# Patient Record
Sex: Female | Born: 1960 | Race: White | Hispanic: No
Health system: Northeastern US, Academic
[De-identification: ages and names within clinical notes are randomized; demographics above are authoritative.]

## PROBLEM LIST (undated history)

## (undated) DIAGNOSIS — E7800 Pure hypercholesterolemia, unspecified: Secondary | ICD-10-CM

## (undated) HISTORY — PX: KNEE SURGERY: SHX244

## (undated) HISTORY — PX: LAPAROSCOPIC GASTRIC SLEEVE RESECTION: SHX5895

## (undated) HISTORY — PX: CHOLECYSTECTOMY: SHX55

---

## 2013-05-14 DIAGNOSIS — M1990 Unspecified osteoarthritis, unspecified site: Secondary | ICD-10-CM | POA: Insufficient documentation

## 2014-11-13 DIAGNOSIS — R911 Solitary pulmonary nodule: Secondary | ICD-10-CM | POA: Insufficient documentation

## 2015-03-24 DIAGNOSIS — Z9884 Bariatric surgery status: Secondary | ICD-10-CM | POA: Insufficient documentation

## 2016-09-12 ENCOUNTER — Encounter: Payer: Self-pay | Admitting: *Deleted

## 2016-09-12 ENCOUNTER — Ambulatory Visit
Admission: EM | Admit: 2016-09-12 | Discharge: 2016-09-12 | Disposition: A | Discharge status: Home / Self Care | Payer: 59 | Attending: Family Medicine | Admitting: Family Medicine

## 2016-09-12 DIAGNOSIS — R5383 Other fatigue: Secondary | ICD-10-CM

## 2016-09-12 DIAGNOSIS — T887XXA Unspecified adverse effect of drug or medicament, initial encounter: Secondary | ICD-10-CM

## 2016-09-12 DIAGNOSIS — N390 Urinary tract infection, site not specified: Secondary | ICD-10-CM

## 2016-09-12 DIAGNOSIS — R110 Nausea: Secondary | ICD-10-CM | POA: Diagnosis not present

## 2016-09-12 DIAGNOSIS — T50905A Adverse effect of unspecified drugs, medicaments and biological substances, initial encounter: Secondary | ICD-10-CM

## 2016-09-12 LAB — URINALYSIS, COMPLETE (UACMP) WITH MICROSCOPIC
BACTERIA UA: NONE SEEN
GLUCOSE, UA: NEGATIVE mg/dL
Ketones, ur: NEGATIVE mg/dL
NITRITE: NEGATIVE
PH: 6.5 (ref 5.0–8.0)
Protein, ur: NEGATIVE mg/dL
SPECIFIC GRAVITY, URINE: 1.025 (ref 1.005–1.030)

## 2016-09-12 LAB — COMPREHENSIVE METABOLIC PANEL
ALK PHOS: 53 U/L (ref 38–126)
ALT: 9 U/L — ABNORMAL LOW (ref 14–54)
ANION GAP: 8 (ref 5–15)
AST: 15 U/L (ref 15–41)
Albumin: 4.2 g/dL (ref 3.5–5.0)
BILIRUBIN TOTAL: 0.5 mg/dL (ref 0.3–1.2)
BUN: 17 mg/dL (ref 6–20)
CALCIUM: 9 mg/dL (ref 8.9–10.3)
CO2: 26 mmol/L (ref 22–32)
Chloride: 103 mmol/L (ref 101–111)
Creatinine, Ser: 0.86 mg/dL (ref 0.44–1.00)
GLUCOSE: 116 mg/dL — AB (ref 65–99)
POTASSIUM: 3.9 mmol/L (ref 3.5–5.1)
Sodium: 137 mmol/L (ref 135–145)
TOTAL PROTEIN: 7 g/dL (ref 6.5–8.1)

## 2016-09-12 LAB — CBC WITH DIFFERENTIAL/PLATELET
BASOS ABS: 0.1 10*3/uL (ref 0–0.1)
Basophils Relative: 1 %
EOS PCT: 0 %
Eosinophils Absolute: 0 10*3/uL (ref 0–0.7)
HEMATOCRIT: 40 % (ref 35.0–47.0)
Hemoglobin: 13.8 g/dL (ref 12.0–16.0)
LYMPHS PCT: 1 %
Lymphs Abs: 0.2 10*3/uL — ABNORMAL LOW (ref 1.0–3.6)
MCH: 29.4 pg (ref 26.0–34.0)
MCHC: 34.4 g/dL (ref 32.0–36.0)
MCV: 85.6 fL (ref 80.0–100.0)
MONO ABS: 0.3 10*3/uL (ref 0.2–0.9)
MONOS PCT: 2 %
NEUTROS ABS: 13.6 10*3/uL — AB (ref 1.4–6.5)
Neutrophils Relative %: 96 %
PLATELETS: 169 10*3/uL (ref 150–440)
RBC: 4.68 MIL/uL (ref 3.80–5.20)
RDW: 12.7 % (ref 11.5–14.5)
WBC: 14.2 10*3/uL — ABNORMAL HIGH (ref 3.6–11.0)

## 2016-09-12 LAB — SEDIMENTATION RATE: Sed Rate: 30 mm/hr (ref 0–30)

## 2016-09-12 MED ORDER — CEPHALEXIN 500 MG PO CAPS: 500.0000 mg | ORAL_CAPSULE | Freq: Two times a day (BID) | ORAL | 0 refills | Status: DC

## 2016-09-12 MED ORDER — METHYLPREDNISOLONE SODIUM SUCC 125 MG IJ SOLR
125.0000 mg | Freq: Once | INTRAMUSCULAR | Status: AC
  Administered 2016-09-12: 125 mg via INTRAMUSCULAR

## 2016-09-12 NOTE — ED Provider Notes (Signed)
[facility]    CSN: [id number] Arrival date & time: 09/12/16  1200     History   Chief Complaint Chief Complaint  Patient presents with  . Generalized Body Aches  . Nausea    HPI Ariana Ertle is a 56 y.o. female.   Patient is here because of general malaise. She states that she was started on Macrobid after using a telemedicine portal at work. She describes symptoms of UTI she was placed on Macrodantin which she used before in the past. She took her dose of Macrobid 100 mg 5:00 in the evening and then about 11:30 at night. She woke this morning aching all over feeling miserable feeling is that she's had the flu. She reports myalgia muscle aches joint pain no fever but feeling miserable. She is never had these symptoms especially when she's had a flu before. She did not take a dose of Macrobid this morning. Even though his been over 12 hours since the last dose she still feels achy.   The history is provided by the patient. No language interpreter was used.  Dysuria  Pain quality:  Aching and burning Pain severity:  Moderate Duration:  4 days Timing:  Constant Progression:  Partially resolved Chronicity:  New Recent urinary tract infections: yes   Relieved by:  Antibiotics Ineffective treatments:  Antibiotics Associated symptoms: no fever   Risk factors: recurrent urinary tract infections     History reviewed. No pertinent past medical history.  There are no active problems to display for this patient.   Past Surgical History:  Procedure Laterality Date  . CESAREAN SECTION    . CHOLECYSTECTOMY    . KNEE SURGERY    . LAPAROSCOPIC GASTRIC SLEEVE RESECTION      OB History    No data available       Home Medications    Prior to Admission medications   Medication Sig Start Date End Date Taking? Authorizing Provider  nitrofurantoin, macrocrystal-monohydrate, (MACROBID) 100 MG capsule Take 100 mg by mouth 2 (two) times daily.   Yes [provider]    Family History Family History  Problem Relation Age of Onset  . Hypertension Mother     Social History Social History  Substance Use Topics  . Smoking status: Never Smoker  . Smokeless tobacco: Never Used  . Alcohol use Yes     Allergies   Patient has no known allergies.   Review of Systems Review of Systems  Constitutional: Positive for activity change, chills and fatigue. Negative for fever.  Genitourinary: Positive for dysuria.  Musculoskeletal: Positive for joint swelling and myalgias.  All other systems reviewed and are negative.    Physical Exam Triage Vital Signs ED Triage Vitals  Enc Vitals Group     BP 09/12/16 1220 107/64     Pulse Rate 09/12/16 1220 (!) 116     Resp 09/12/16 1220 18     Temp 09/12/16 1220 99.7 F (37.6 C)     Temp Source 09/12/16 1220 Oral     SpO2 09/12/16 1220 99 %     Weight 09/12/16 1221 186 lb (84.4 kg)     Height 09/12/16 1221 5\' 8"  (1.727 m)     Head Circumference --      Peak Flow --      Pain Score 09/12/16 1224 0     Pain Loc --      Pain Edu? --      Excl. in GC? --  No data found.   Updated Vital Signs BP 107/64 (BP Location: Left Arm)   Pulse (!) 116   Temp 99.7 F (37.6 C) (Oral)   Resp 18   Ht 5\' 8"  (1.727 m)   Wt 186 lb (84.4 kg)   SpO2 99%   BMI 28.28 kg/m   Visual Acuity Right Eye Distance:   Left Eye Distance:   Bilateral Distance:    Right Eye Near:   Left Eye Near:    Bilateral Near:     Physical Exam  Constitutional: She is oriented to person, place, and time. She appears well-developed and well-nourished.  Non-toxic appearance. She does not have a sickly appearance. She appears ill. No distress.  HENT:  Head: Normocephalic and atraumatic.  Right Ear: External ear normal.  Left Ear: External ear normal.  Mouth/Throat: Oropharynx is clear and moist.  Eyes: Pupils are equal, round, and reactive to light.  Neck: Normal range of motion. Neck supple.  Cardiovascular:  Normal rate, regular rhythm and normal heart sounds.   Pulmonary/Chest: Effort normal and breath sounds normal.  Abdominal: Soft. Bowel sounds are normal. She exhibits no distension. There is no tenderness.  Musculoskeletal: Normal range of motion. She exhibits no tenderness.  Neurological: She is alert and oriented to person, place, and time. No cranial nerve deficit. Coordination normal.  Skin: Skin is warm. She is not diaphoretic.  Psychiatric: She has a normal mood and affect.  Vitals reviewed.    UC Treatments / Results  Labs (all labs ordered are listed, but only abnormal results are displayed) Labs Reviewed  CBC WITH DIFFERENTIAL/PLATELET - Abnormal; Notable for the following:       Result Value   WBC 14.2 (*)    Neutro Abs 13.6 (*)    Lymphs Abs 0.2 (*)    All other components within normal limits  COMPREHENSIVE METABOLIC PANEL - Abnormal; Notable for the following:    Glucose, Bld 116 (*)    ALT 9 (*)    All other components within normal limits  URINALYSIS, COMPLETE (UACMP) WITH MICROSCOPIC - Abnormal; Notable for the following:    Color, Urine AMBER (*)    APPearance CLOUDY (*)    Hgb urine dipstick SMALL (*)    Bilirubin Urine SMALL (*)    Leukocytes, UA SMALL (*)    Squamous Epithelial / LPF 0-5 (*)    All other components within normal limits  URINE CULTURE  SEDIMENTATION RATE    EKG  EKG Interpretation None       Radiology No results found.  Procedures Procedures (including critical care time)  Medications Ordered in UC Medications - No data to display  Results for orders placed or performed during the hospital encounter of 09/12/16  CBC with Differential  Result Value Ref Range   WBC 14.2 (H) 3.6 - 11.0 K/uL   RBC 4.68 3.80 - 5.20 MIL/uL   Hemoglobin 13.8 12.0 - 16.0 g/dL   HCT 16.1 09.6 - 04.5 %   MCV 85.6 80.0 - 100.0 fL   MCH 29.4 26.0 - 34.0 pg   MCHC 34.4 32.0 - 36.0 g/dL   RDW 40.9 81.1 - 91.4 %   Platelets 169 150 - 440 K/uL    Neutrophils Relative % 96 %   Neutro Abs 13.6 (H) 1.4 - 6.5 K/uL   Lymphocytes Relative 1 %   Lymphs Abs 0.2 (L) 1.0 - 3.6 K/uL   Monocytes Relative 2 %   Monocytes Absolute 0.3 0.2 - 0.9  K/uL   Eosinophils Relative 0 %   Eosinophils Absolute 0.0 0 - 0.7 K/uL   Basophils Relative 1 %   Basophils Absolute 0.1 0 - 0.1 K/uL  Sedimentation rate  Result Value Ref Range   Sed Rate 30 0 - 30 mm/hr  Comprehensive metabolic panel  Result Value Ref Range   Sodium 137 135 - 145 mmol/L   Potassium 3.9 3.5 - 5.1 mmol/L   Chloride 103 101 - 111 mmol/L   CO2 26 22 - 32 mmol/L   Glucose, Bld 116 (H) 65 - 99 mg/dL   BUN 17 6 - 20 mg/dL   Creatinine, Ser 7.820.86 0.44 - 1.00 mg/dL   Calcium 9.0 8.9 - 95.610.3 mg/dL   Total Protein 7.0 6.5 - 8.1 g/dL   Albumin 4.2 3.5 - 5.0 g/dL   AST 15 15 - 41 U/L   ALT 9 (L) 14 - 54 U/L   Alkaline Phosphatase 53 38 - 126 U/L   Total Bilirubin 0.5 0.3 - 1.2 mg/dL   GFR calc non Af Amer >60 >60 mL/min   GFR calc Af Amer >60 >60 mL/min   Anion gap 8 5 - 15  Urinalysis, Complete w Microscopic  Result Value Ref Range   Color, Urine AMBER (A) YELLOW   APPearance CLOUDY (A) CLEAR   Specific Gravity, Urine 1.025 1.005 - 1.030   pH 6.5 5.0 - 8.0   Glucose, UA NEGATIVE NEGATIVE mg/dL   Hgb urine dipstick SMALL (A) NEGATIVE   Bilirubin Urine SMALL (A) NEGATIVE   Ketones, ur NEGATIVE NEGATIVE mg/dL   Protein, ur NEGATIVE NEGATIVE mg/dL   Nitrite NEGATIVE NEGATIVE   Leukocytes, UA SMALL (A) NEGATIVE   Squamous Epithelial / LPF 0-5 (A) NONE SEEN   WBC, UA TOO NUMEROUS TO COUNT 0 - 5 WBC/hpf   RBC / HPF 6-30 0 - 5 RBC/hpf   Bacteria, UA NONE SEEN NONE SEEN   Mucous PRESENT    Initial Impression / Assessment and Plan / UC Course  I have reviewed the triage vital signs and the nursing notes.  Pertinent labs & imaging results that were available during my care of the patient were reviewed by me and considered in my medical decision making (see chart for details).    patient sedimentation rate was high normal 30 white count was slightly elevated the kidney function appears be normal it would appear to be a drug reaction symptoms she's having so will give her dose of Solu-Medrol have been monitored and wash it home and placed on Keflex 500 mg twice a day for 7 days. PCP if not better in 2-3 weeks and work note written for today and tomorrow. If symptoms become worse when recommend she go to the ED of her choice possible blood culture further evaluation does not appear to be a pyelonephritis infection. Final Clinical Impressions(s) / UC Diagnoses   Final diagnoses:  Lower urinary tract infectious disease  Drug reaction, initial encounter    New Prescriptions New Prescriptions   No medications on file    Note: This dictation was prepared with Dragon dictation along with smaller phrase technology. Any transcriptional errors that result from this process are unintentional.   Hassan RowanWade, Niamya Vittitow, MD 09/12/16 1350

## 2016-09-12 NOTE — ED Triage Notes (Signed)
Patient was treated for UTI yesterday and started taking Macrobid yesterday evening. Patient awoke this AM with generalized body aches and nausea. Patient reports taking "microbid" in the past without issue.

## 2016-09-13 LAB — URINE CULTURE: Culture: NO GROWTH

## 2016-12-11 DIAGNOSIS — E7800 Pure hypercholesterolemia, unspecified: Secondary | ICD-10-CM | POA: Insufficient documentation

## 2016-12-11 DIAGNOSIS — E538 Deficiency of other specified B group vitamins: Secondary | ICD-10-CM | POA: Insufficient documentation

## 2017-03-14 ENCOUNTER — Emergency Department: Payer: Managed Care, Other (non HMO)

## 2017-03-14 ENCOUNTER — Encounter: Payer: Self-pay | Admitting: Intensive Care

## 2017-03-14 ENCOUNTER — Emergency Department
Admission: EM | Admit: 2017-03-14 | Discharge: 2017-03-14 | Disposition: A | Discharge status: Home / Self Care | Payer: Managed Care, Other (non HMO) | Attending: Emergency Medicine | Admitting: Emergency Medicine

## 2017-03-14 DIAGNOSIS — K5904 Chronic idiopathic constipation: Secondary | ICD-10-CM | POA: Diagnosis not present

## 2017-03-14 DIAGNOSIS — K641 Second degree hemorrhoids: Secondary | ICD-10-CM

## 2017-03-14 DIAGNOSIS — K5900 Constipation, unspecified: Secondary | ICD-10-CM | POA: Diagnosis present

## 2017-03-14 MED ORDER — HYDROCODONE-ACETAMINOPHEN 5-325 MG PO TABS
1.0000 | ORAL_TABLET | Freq: Once | ORAL | Status: AC
  Administered 2017-03-14: 1 via ORAL
  Filled 2017-03-14: qty 1

## 2017-03-14 MED ORDER — HYDROCODONE-ACETAMINOPHEN 10-325 MG PO TABS: 1.0000 | ORAL_TABLET | Freq: Once | ORAL | Status: DC

## 2017-03-14 MED ORDER — DIBUCAINE 1 % RE OINT: 1.0000 "application " | TOPICAL_OINTMENT | Freq: Three times a day (TID) | RECTAL | 0 refills | Status: AC | PRN

## 2017-03-14 MED ORDER — HYDROCORTISONE ACETATE 25 MG RE SUPP: 25.0000 mg | Freq: Two times a day (BID) | RECTAL | 1 refills | Status: AC

## 2017-03-14 MED ORDER — LIDOCAINE HCL 2 % EX GEL
1.0000 "application " | Freq: Once | CUTANEOUS | Status: AC
  Administered 2017-03-14: 1 via TOPICAL
  Filled 2017-03-14: qty 10

## 2017-03-14 MED ORDER — OXYCODONE HCL 5 MG PO TABS
5.0000 mg | ORAL_TABLET | Freq: Once | ORAL | Status: AC
  Administered 2017-03-14: 5 mg via ORAL
  Filled 2017-03-14: qty 1

## 2017-03-14 MED ORDER — POLYETHYLENE GLYCOL 3350 17 G PO PACK
17.0000 g | PACK | Freq: Every day | ORAL | Status: DC
  Administered 2017-03-14: 17 g via ORAL
  Filled 2017-03-14: qty 1

## 2017-03-14 MED ORDER — SORBITOL 70 % SOLN
960.0000 mL | TOPICAL_OIL | Freq: Once | ORAL | Status: DC
  Filled 2017-03-14: qty 473

## 2017-03-14 MED ORDER — GLYCERIN (LAXATIVE) 2.1 G RE SUPP
1.0000 | Freq: Once | RECTAL | Status: AC
  Administered 2017-03-14: 1 via RECTAL
  Filled 2017-03-14: qty 1

## 2017-03-14 MED ORDER — MAGNESIUM CITRATE PO SOLN
1.0000 | Freq: Once | ORAL | Status: AC
Start: 2017-03-14 — End: 2017-03-14
  Administered 2017-03-14: 1 via ORAL
  Filled 2017-03-14: qty 296

## 2017-03-14 MED ORDER — HYDROCORTISONE ACETATE 25 MG RE SUPP
25.0000 mg | Freq: Once | RECTAL | Status: AC
  Administered 2017-03-14: 25 mg via RECTAL
  Filled 2017-03-14: qty 1

## 2017-03-14 NOTE — Discharge Instructions (Signed)
You exam is consistent with hemorrhoids complicated by constipation. Increase your daily fiber and dose Miralax daily. Consider an OTC glycerin suppository as needed. Follow-up with your provider or gastroenterology as discussed.

## 2017-03-14 NOTE — ED Notes (Signed)
Received suppositories at this time from pharmacy for patient

## 2017-03-14 NOTE — ED Notes (Signed)
Pt reports to this nurse that she had a liquid bowel movement and does not believe she needs the enema. Provider notified.

## 2017-03-14 NOTE — ED Notes (Signed)
See triage note  Presents with rectal pain d/t hemorrhoids  States she also has been constipated   Unsure of last BM  Positive nausea no vomiting  States she has tried OTC meds w/o relief

## 2017-03-14 NOTE — ED Notes (Signed)
conts to have no results with prior meds   Provider aware

## 2017-03-14 NOTE — ED Triage Notes (Addendum)
Patient reports a flare up of hemorrhoids and constipation for over a week. Is passing gas. Ambulatory with grimace on face. Has tried OTC meds with no relief for constipation. HX weight loss surgery

## 2017-03-14 NOTE — ED Provider Notes (Signed)
[facility] Emergency Department Provider Note ____________________________________________  Time seen: 1135  I have reviewed the triage vital signs and the nursing notes.  HISTORY  Chief Complaint  Hemorrhoids and Constipation  HPI Ariana Jefferson is a 57 y.o. female sent to the ED for evaluation of rectal pain and pressure for the last week.  Patient describes history of hemorrhoids and constipation in the past.  She reports passing gas but no significant stool.  She is without any fevers, chills, nausea, vomiting, or abdominal pain.  She has tried over-the-counter MiraLAX without significant benefit.  History reviewed. No pertinent past medical history.  There are no active problems to display for this patient.   Past Surgical History:  Procedure Laterality Date  . CESAREAN SECTION    . CHOLECYSTECTOMY    . KNEE SURGERY    . LAPAROSCOPIC GASTRIC SLEEVE RESECTION      Prior to Admission medications   Medication Sig Start Date End Date Taking? Authorizing Provider  cephALEXin (KEFLEX) 500 MG capsule Take 1 capsule (500 mg total) by mouth 2 (two) times daily. 09/12/16   Ariana Jefferson, Eugene, MD  dibucaine (NUPERCAINAL) 1 % OINT Place 1 application rectally 3 (three) times daily as needed for up to 10 days for hemorrhoids. 03/14/17 03/24/17  Ariana Jefferson, Ariana IvoryJenise V Bacon, PA-C  hydrocortisone (ANUSOL-HC) 25 MG suppository Place 1 suppository (25 mg total) rectally every 12 (twelve) hours for 12 days. 03/14/17 03/26/17  Ariana Jefferson, Ariana IvoryJenise V Bacon, PA-C  nitrofurantoin, macrocrystal-monohydrate, (MACROBID) 100 MG capsule Take 100 mg by mouth 2 (two) times daily.    [provider]    Allergies Patient has no known allergies.  Family History  Problem Relation Age of Onset  . Hypertension Mother     Social History Social History   Tobacco Use  . Smoking status: Never Smoker  . Smokeless tobacco: Never Used  Substance Use Topics  . Alcohol use: Yes  . Drug use: No     Review of Systems  Constitutional: Negative for fever. Eyes: Negative for visual changes. ENT: Negative for sore throat. Cardiovascular: Negative for chest pain. Respiratory: Negative for shortness of breath. Gastrointestinal: Negative for abdominal pain, vomiting and diarrhea. Genitourinary: Negative for dysuria. Musculoskeletal: Negative for back pain. Skin: Negative for rash. Neurological: Negative for headaches, focal weakness or numbness. ____________________________________________  PHYSICAL EXAM:  VITAL SIGNS: ED Triage Vitals  Enc Vitals Group     BP 03/14/17 1649 113/73     Pulse Rate 03/14/17 1649 83     Resp 03/14/17 1649 16     Temp --      Temp src --      SpO2 03/14/17 1649 96 %     Weight 03/14/17 1002 187 lb (84.8 kg)     Height 03/14/17 1002 5\' 9"  (1.753 m)     Head Circumference --      Peak Flow --      Pain Score 03/14/17 1400 2     Pain Loc --      Pain Edu? --      Excl. in GC? --     Constitutional: Alert and oriented. Well appearing and in no distress. Head: Normocephalic and atraumatic. Cardiovascular: Normal rate, regular rhythm. Normal distal pulses. Respiratory: Normal respiratory effort. No wheezes/rales/rhonchi. Gastrointestinal: Soft and nontender. No distention.  Rectal exam reveals multiple soft, moderately sized prolapsed internal and engorged external hemorrhoids.  No incarceration or strangulation is appreciated.  DRE reveals normal rectal tone and palpable internal hemorrhoids.  Musculoskeletal: Nontender with normal range of motion in all extremities.  Neurologic:  Normal gait without ataxia. Normal speech and language. No gross focal neurologic deficits are appreciated. Skin:  Skin is warm, dry and intact. No rash noted. ____________________________________________   RADIOLOGY  ABD 1 View IMPRESSION: Large amount of stool in the ascending and transverse colon.  I, Ariana Jefferson, Ariana Ivory, personally viewed and evaluated  these images (plain radiographs) as part of my medical decision making, as well as reviewing the written report by the radiologist. ____________________________________________  PROCEDURES  Procedures Glycerin suppository Hydrocortisone suppository Magnesium citrate 1 bottle Miralax 17 g PO Norco 5-325 mg PO Oxycodone IR 5 mg PO ____________________________________________  INITIAL IMPRESSION / ASSESSMENT AND PLAN / ED COURSE  Patient presents to the ED for evaluation of rectal pain and pressure.  She is also has underlying constipation.  Her exam is consistent with moderate stool burden and internal and external hemorrhoids.  Patient reports improvement of her symptoms after a moderate amount of soft watery stool this past in the ED.  She is discharged at this time with prescriptions for Anusol suppositories and dibucaine topical ointment.  She is encouraged to increase her daily fiber intake and use MiraLAX daily.  She will follow-up with her primary care provider or gastroenterology for ongoing symptom management. ____________________________________________  FINAL CLINICAL IMPRESSION(S) / ED DIAGNOSES  Final diagnoses:  Chronic idiopathic constipation  Grade II hemorrhoids      Ariana Jefferson, Ariana Ivory, PA-C 03/14/17 1749    Ariana Antis, MD 03/14/17 1947

## 2017-09-29 ENCOUNTER — Emergency Department: Payer: Managed Care, Other (non HMO)

## 2017-09-29 ENCOUNTER — Encounter: Payer: Self-pay | Admitting: Emergency Medicine

## 2017-09-29 ENCOUNTER — Emergency Department
Admission: EM | Admit: 2017-09-29 | Discharge: 2017-09-29 | Disposition: A | Discharge status: Home / Self Care | Payer: Managed Care, Other (non HMO) | Attending: Emergency Medicine | Admitting: Emergency Medicine

## 2017-09-29 ENCOUNTER — Other Ambulatory Visit: Payer: Self-pay

## 2017-09-29 DIAGNOSIS — R1013 Epigastric pain: Secondary | ICD-10-CM | POA: Insufficient documentation

## 2017-09-29 DIAGNOSIS — R112 Nausea with vomiting, unspecified: Secondary | ICD-10-CM | POA: Diagnosis not present

## 2017-09-29 LAB — COMPREHENSIVE METABOLIC PANEL
ALBUMIN: 4.3 g/dL (ref 3.5–5.0)
ALT: 10 U/L (ref 0–44)
AST: 16 U/L (ref 15–41)
Alkaline Phosphatase: 51 U/L (ref 38–126)
Anion gap: 9 (ref 5–15)
BUN: 20 mg/dL (ref 6–20)
CHLORIDE: 106 mmol/L (ref 98–111)
CO2: 26 mmol/L (ref 22–32)
CREATININE: 0.75 mg/dL (ref 0.44–1.00)
Calcium: 9.4 mg/dL (ref 8.9–10.3)
GFR calc Af Amer: >60 mL/min (ref >60)
GLUCOSE: 107 mg/dL — AB (ref 70–99)
Potassium: 3.9 mmol/L (ref 3.5–5.1)
Sodium: 141 mmol/L (ref 135–145)
Total Bilirubin: 1 mg/dL (ref 0.3–1.2)
Total Protein: 7 g/dL (ref 6.5–8.1)

## 2017-09-29 LAB — CBC
HEMATOCRIT: 40.4 % (ref 35.0–47.0)
Hemoglobin: 14.3 g/dL (ref 12.0–16.0)
MCH: 30.5 pg (ref 26.0–34.0)
MCHC: 35.4 g/dL (ref 32.0–36.0)
MCV: 85.9 fL (ref 80.0–100.0)
Platelets: 225 10*3/uL (ref 150–440)
RBC: 4.7 MIL/uL (ref 3.80–5.20)
RDW: 13.7 % (ref 11.5–14.5)
WBC: 10.8 10*3/uL (ref 3.6–11.0)

## 2017-09-29 LAB — LIPASE, BLOOD: LIPASE: 22 U/L (ref 11–51)

## 2017-09-29 LAB — TROPONIN I
Troponin I: <0.03 ng/mL (ref <0.03)
Troponin I: <0.03 ng/mL (ref <0.03)

## 2017-09-29 MED ORDER — ONDANSETRON 4 MG PO TBDP: 4.0000 mg | ORAL_TABLET | Freq: Three times a day (TID) | ORAL | 0 refills | Status: DC | PRN

## 2017-09-29 MED ORDER — ALUM & MAG HYDROXIDE-SIMETH 400-400-40 MG/5ML PO SUSP: 5.0000 mL | Freq: Four times a day (QID) | ORAL | 0 refills | Status: DC | PRN

## 2017-09-29 MED ORDER — PANTOPRAZOLE SODIUM 20 MG PO TBEC: 20.0000 mg | DELAYED_RELEASE_TABLET | Freq: Every day | ORAL | 0 refills | Status: AC

## 2017-09-29 NOTE — ED Notes (Signed)
Pt declines offer for warm blankets.

## 2017-09-29 NOTE — ED Notes (Signed)
Pt waiting patiently for treatment room; provided blanket for comfort

## 2017-09-29 NOTE — Discharge Instructions (Addendum)

## 2017-09-29 NOTE — ED Notes (Signed)
Pt states epigastric to bilateral upper quadrant pain with nausea and vomiting today. Pt states she does not have pain currently, nausea continues. Last emesis in waiting room. Pt denies diarrhea. resps unlabored.

## 2017-09-29 NOTE — ED Provider Notes (Signed)
[facility] Emergency Department Provider Note  ____________________________________________  Time seen: Approximately 9:37 PM  I have reviewed the triage vital signs and the nursing notes.   HISTORY  Chief Complaint Abdominal Pain (Epigastric) and Emesis   HPI Ariana Jefferson is a 57 y.o. female with a history of gastric sleeve, cholecystectomy, and C-section who presents for evaluation of epigastric abdominal pain.  Patient reports that the pain started at 1 AM in the morning.  She was sleeping on a camping trip.  The pain was severe and burning, located in the epigastric region, radiating to the back, and associated with nausea.  After a few hours patient was able to fall asleep.  When she woke up this morning the pain was persistent and more severe.  Patient had several episodes of nonbloody nonbilious emesis.  She reports that this evening she started to be afraid that this could be cardiac in nature which prompted her visit to the emergency room.  No hematemesis or coffee-ground emesis, no constipation or diarrhea, no fever or chills, no chest pain or shortness of breath.  She denies history of GERD.  She denies eating anything abnormal yesterday.  At this time patient is pain-free.  No prior history of peptic ulcer disease.  PMH Low vitamin B12 DJD Basal cell cancer  Past Surgical History:  Procedure Laterality Date  . CESAREAN SECTION    . CHOLECYSTECTOMY    . KNEE SURGERY    . LAPAROSCOPIC GASTRIC SLEEVE RESECTION      Prior to Admission medications   Medication Sig Start Date End Date Taking? Authorizing Provider  alum & mag hydroxide-simeth (MAALOX MAX) 400-400-40 MG/5ML suspension Take 5 mLs by mouth every 6 (six) hours as needed for indigestion. 09/29/17   Don PerkingVeronese, [place], MD  cephALEXin (KEFLEX) 500 MG capsule Take 1 capsule (500 mg total) by mouth 2 (two) times daily. 09/12/16   Hassan RowanWade, [place], MD  nitrofurantoin, macrocrystal-monohydrate,  (MACROBID) 100 MG capsule Take 100 mg by mouth 2 (two) times daily.    [provider]  ondansetron (ZOFRAN ODT) 4 MG disintegrating tablet Take 1 tablet (4 mg total) by mouth every 8 (eight) hours as needed for nausea or vomiting. 09/29/17   Don PerkingVeronese, [place], MD  pantoprazole (PROTONIX) 20 MG tablet Take 1 tablet (20 mg total) by mouth daily for 7 days. 09/29/17 10/06/17  Nita SickleVeronese, [place], MD    Allergies Patient has no known allergies.  Family History  Problem Relation Age of Onset  . Hypertension Mother     Social History Social History   Tobacco Use  . Smoking status: Never Smoker  . Smokeless tobacco: Never Used  Substance Use Topics  . Alcohol use: Yes  . Drug use: No    Review of Systems  Constitutional: Negative for fever. Eyes: Negative for visual changes. ENT: Negative for sore throat. Neck: No neck pain  Cardiovascular: Negative for chest pain. Respiratory: Negative for shortness of breath. Gastrointestinal: + burning epigastric abdominal pain, nausea, and vomiting. No diarrhea. Genitourinary: Negative for dysuria. Musculoskeletal: Negative for back pain. Skin: Negative for rash. Neurological: Negative for headaches, weakness or numbness. Psych: No SI or HI  ____________________________________________   PHYSICAL EXAM:  VITAL SIGNS: ED Triage Vitals  Enc Vitals Group     BP 09/29/17 1639 134/84     Pulse Rate 09/29/17 1639 87     Resp 09/29/17 1639 18     Temp 09/29/17 1639 98 F (36.7 C)     Temp Source  09/29/17 1639 Oral     SpO2 09/29/17 1639 99 %     Weight 09/29/17 1640 192 lb (87.1 kg)     Height 09/29/17 1640 5' 8.5" (1.74 m)     Head Circumference --      Peak Flow --      Pain Score 09/29/17 1640 7     Pain Loc --      Pain Edu? --      Excl. in GC? --     Constitutional: Alert and oriented. Well appearing and in no apparent distress. HEENT:      Head: Normocephalic and atraumatic.         Eyes: Conjunctivae are normal.  Sclera is non-icteric.       Mouth/Throat: Mucous membranes are moist.       Neck: Supple with no signs of meningismus. Cardiovascular: Regular rate and rhythm. No murmurs, gallops, or rubs. 2+ symmetrical distal pulses are present in all extremities. No JVD. Respiratory: Normal respiratory effort. Lungs are clear to auscultation bilaterally. No wheezes, crackles, or rhonchi.  Gastrointestinal: Soft, mild epigastric tenderness, and non distended with positive bowel sounds. No rebound or guarding. Genitourinary: No CVA tenderness. Musculoskeletal: Nontender with normal range of motion in all extremities. No edema, cyanosis, or erythema of extremities. Neurologic: Normal speech and language. Face is symmetric. Moving all extremities. No gross focal neurologic deficits are appreciated. Skin: Skin is warm, dry and intact. No rash noted. Psychiatric: Mood and affect are normal. Speech and behavior are normal.  ____________________________________________   LABS (all labs ordered are listed, but only abnormal results are displayed)  Labs Reviewed  COMPREHENSIVE METABOLIC PANEL - Abnormal; Notable for the following components:      Result Value   Glucose, Bld 107 (*)    All other components within normal limits  LIPASE, BLOOD  CBC  TROPONIN I  TROPONIN I  URINALYSIS, COMPLETE (UACMP) WITH MICROSCOPIC   ____________________________________________  EKG  ED ECG REPORT I, Nita Sickle, the attending physician, personally viewed and interpreted this ECG.  Normal sinus rhythm, rate of 91, normal intervals, normal axis, T wave inversion in 1, aVL, V2, no ST elevations.  No prior for comparison. ____________________________________________  RADIOLOGY  I have personally reviewed the images performed during this visit and I agree with the Radiologist's read.   Interpretation by Radiologist:  Dg Abdomen Acute W/chest  Result Date: 09/29/2017 CLINICAL DATA:  Epigastric abdominal pain  starting this morning with vomiting. EXAM: DG ABDOMEN ACUTE W/ 1V CHEST COMPARISON:  None. FINDINGS: Normal cardiac and mediastinal contours without alveolar consolidation, CHF, effusion or pneumothorax. Moderate stool retention within the colon. Scattered air containing small bowel loops without obstructive pattern. Findings may represent a mild enteritis. Gas and stool is noted in the rectum. No acute osseous abnormality. Lower lumbar facet arthropathy at L5-S1. Cholecystectomy clips are seen in the right upper quadrant. No radiopaque calculi. IMPRESSION: 1. No acute cardiopulmonary disease. 2. A few scattered gas containing, mildly distended small bowel loops are noted in a nonobstructive bowel gas pattern. Findings may represent a small bowel enteritis. Increased colonic stool burden is also noted. Electronically Signed   By: Tollie Eth M.D.   On: 09/29/2017 22:28      ____________________________________________   PROCEDURES  Procedure(s) performed: None Procedures Critical Care performed:  None ____________________________________________   INITIAL IMPRESSION / ASSESSMENT AND PLAN / ED COURSE  57 y.o. female with a history of gastric sleeve, cholecystectomy, and C-section who presents for evaluation of  epigastric abdominal pain.  Patient is now pain-free, vitals are within normal limits, she is well-appearing, abdomen is soft with mild tenderness to palpation in the epigastric region, no rebound or guarding.  EKG showing T wave inversions in 1, aVL, and V2.  No prior for comparison.  Labs including CBC, CMP, lipase, troponin are all within normal limits.  We will get a second troponin since initial labs were done 5 hours ago to rule out ACS.  Ddx PUD, gastritis, GERD, ACS.     _________________________ 11:02 PM on 09/29/2017 -----------------------------------------  Second troponin is also negative.  Patient remains pain-free.  KUB was done concerning for possible enteritis. at this  time most likely diagnosis would be GERD versus peptic ulcer disease versus enteritis.  Will discharge patient with Maalox, Zofran, and Protonix.  Recommend follow-up with her primary care doctor.  Discussed return precautions for recurrence of the pain, coffee-ground emesis, new or worsening abdominal pain or fever   As part of my medical decision making, I reviewed the following data within the electronic MEDICAL RECORD NUMBER Nursing notes reviewed and incorporated, Labs reviewed , EKG interpreted , Old chart reviewed, Radiograph reviewed , Notes from prior ED visits and [place] Controlled Substance Database    Pertinent labs & imaging results that were available during my care of the patient were reviewed by me and considered in my medical decision making (see chart for details).    ____________________________________________   FINAL CLINICAL IMPRESSION(S) / ED DIAGNOSES  Final diagnoses:  Epigastric burning sensation      NEW MEDICATIONS STARTED DURING THIS VISIT:  ED Discharge Orders         Ordered    alum & mag hydroxide-simeth (MAALOX MAX) 400-400-40 MG/5ML suspension  Every 6 hours PRN     09/29/17 2259    pantoprazole (PROTONIX) 20 MG tablet  Daily     09/29/17 2259    ondansetron (ZOFRAN ODT) 4 MG disintegrating tablet  Every 8 hours PRN     09/29/17 2259           Note:  This document was prepared using Dragon voice recognition software and may include unintentional dictation errors.    Nita Sickle, MD 09/29/17 [phone]

## 2017-09-29 NOTE — ED Triage Notes (Addendum)
Pt arrived via POV with family with reports of epigastric abdominal pain that started this morning. Pt states the pain has worsened throughout the day and has vomited multiple times.  Pt has hx of gastric sleeve in 2017, lap chole, c-section, and BTL.   Pt describes the pain as burning rating 7/10 on the pain scale. Pt states at intermittent times the pain worsens and is an 8-9/10.  Pt states the pain radiates to her back

## 2017-09-29 NOTE — ED Notes (Signed)
Report received on pt and care assumed.

## 2017-09-29 NOTE — ED Notes (Signed)
Pt unable to urinate while in triage.

## 2018-02-07 ENCOUNTER — Ambulatory Visit: Payer: Managed Care, Other (non HMO) | Admitting: Podiatry

## 2018-02-07 ENCOUNTER — Ambulatory Visit (INDEPENDENT_AMBULATORY_CARE_PROVIDER_SITE_OTHER): Payer: Managed Care, Other (non HMO)

## 2018-02-07 ENCOUNTER — Encounter: Payer: Self-pay | Admitting: Podiatry

## 2018-02-07 VITALS — Temp 97.7°F

## 2018-02-07 DIAGNOSIS — L84 Corns and callosities: Secondary | ICD-10-CM

## 2018-02-07 DIAGNOSIS — Z85828 Personal history of other malignant neoplasm of skin: Secondary | ICD-10-CM | POA: Insufficient documentation

## 2018-02-07 DIAGNOSIS — M205X2 Other deformities of toe(s) (acquired), left foot: Secondary | ICD-10-CM

## 2018-02-07 DIAGNOSIS — E669 Obesity, unspecified: Secondary | ICD-10-CM | POA: Insufficient documentation

## 2018-02-07 DIAGNOSIS — M2042 Other hammer toe(s) (acquired), left foot: Secondary | ICD-10-CM | POA: Diagnosis not present

## 2018-02-07 NOTE — Progress Notes (Signed)
Subjective:    Patient ID: Vara GuardianDonna Thornhill, female    DOB: 04/25/1960, 57 y.o.   MRN: [id number]  HPI  57 year old female presents to the office today for concerns of a painful corn to the inside aspect of her fifth toe which is been on for the last 2 weeks and hurts with shoes.  She also states it hurts when she was barefoot.  For last 2 days she was using a medicated corn pad which is helped the top layer to come off.  She did notice the central aspect has been red.  Denies any drainage or pus coming from the area.  She states is feeling much better injection was canceled today's appointment.  She was at the area checked however.  Review of Systems  All other systems reviewed and are negative.  History reviewed. No pertinent past medical history.  Past Surgical History:  Procedure Laterality Date  . CESAREAN SECTION    . CHOLECYSTECTOMY    . KNEE SURGERY    . LAPAROSCOPIC GASTRIC SLEEVE RESECTION       Current Outpatient Medications:  .  alum & mag hydroxide-simeth (MAALOX MAX) 400-400-40 MG/5ML suspension, Take 5 mLs by mouth every 6 (six) hours as needed for indigestion., Disp: 355 mL, Rfl: 0 .  cephALEXin (KEFLEX) 500 MG capsule, Take 1 capsule (500 mg total) by mouth 2 (two) times daily., Disp: 10 capsule, Rfl: 0 .  nitrofurantoin, macrocrystal-monohydrate, (MACROBID) 100 MG capsule, Take 100 mg by mouth 2 (two) times daily., Disp: , Rfl:  .  ondansetron (ZOFRAN ODT) 4 MG disintegrating tablet, Take 1 tablet (4 mg total) by mouth every 8 (eight) hours as needed for nausea or vomiting., Disp: 20 tablet, Rfl: 0 .  pantoprazole (PROTONIX) 20 MG tablet, Take 1 tablet (20 mg total) by mouth daily for 7 days., Disp: 7 tablet, Rfl: 0  No Known Allergies       Objective:   Physical Exam  General: AAO x3, NAD  Dermatological: Along the medial aspect of the left fifth toe there is evidence of a prior corn that is been removed.  There is some residual hyperkeratotic tissue to the  area but otherwise it got a macerated base.  Actually the central aspect appears to have a small wound present from where the deepest part had been removed.  There is no drainage or pus and there is no edema, erythema to the toe and there is no fluctuation crepitation or any malodor.  Vascular: Dorsalis Pedis artery and Posterior Tibial artery pedal pulses are 2/4 bilateral with immedate capillary fill time. There is no pain with calf compression, swelling, warmth, erythema.   Neruologic: Grossly intact via light touch bilateral.  Protective threshold with Semmes Wienstein monofilament intact to all pedal sites bilateral.   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  Muscular strength 5/5 in all groups tested bilateral.  Gait: Unassisted, Nonantalgic.     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  Adductovarus is present.  No evidence of acute fracture, osteomyelitis.  No soft tissue emphysema. -I did debride some of the residual hyperkeratotic tissue without any complications or bleeding.  A Band-Aid was applied to the area.  There is actually some very small superficial area of skin breakdown the central aspect.  Recommend holding off on the core remover pad.  Monitor  for any signs or symptoms of infection.  Dispensed offloading pads to help with reoccurrence.  Vivi BarrackMatthew R Wagoner DPM

## 2018-02-08 ENCOUNTER — Other Ambulatory Visit: Payer: Self-pay | Admitting: Podiatry

## 2018-02-08 DIAGNOSIS — M2042 Other hammer toe(s) (acquired), left foot: Secondary | ICD-10-CM

## 2019-02-24 MED ORDER — DEXMETHYLPHENIDATE ER 20 MG CAPSULE,EXTENDED RELEASE BIPHASIC50-50
20 | ORAL_CAPSULE | ORAL | 1 refills | 30.00000 days | Status: AC
Start: 2019-02-24 — End: 2019-03-24

## 2019-03-24 MED ORDER — DEXMETHYLPHENIDATE ER 20 MG CAPSULE,EXTENDED RELEASE BIPHASIC50-50
20 | ORAL_CAPSULE | ORAL | 1 refills | 30.00000 days | Status: AC
Start: 2019-03-24 — End: 2019-04-22

## 2019-04-21 MED ORDER — DEXMETHYLPHENIDATE ER 20 MG CAPSULE,EXTENDED RELEASE BIPHASIC50-50
20 | ORAL_CAPSULE | ORAL | 1 refills | 30.00000 days | Status: AC
Start: 2019-04-21 — End: 2019-05-21

## 2019-04-22 MED ORDER — OMEPRAZOLE 40 MG CAPSULE,DELAYED RELEASE
40 | ORAL_CAPSULE | ORAL | 12 refills | 90.00000 days | Status: AC
Start: 2019-04-22 — End: 2020-03-30

## 2019-05-09 ENCOUNTER — Encounter: Admit: 2019-05-09 | Payer: PRIVATE HEALTH INSURANCE | Attending: Obstetrics and Gynecology | Primary: Internal Medicine

## 2019-05-21 MED ORDER — DEXMETHYLPHENIDATE ER 20 MG CAPSULE,EXTENDED RELEASE BIPHASIC50-50
20 | ORAL_CAPSULE | ORAL | 1 refills | 30.00000 days | Status: AC
Start: 2019-05-21 — End: 2019-06-16

## 2019-06-16 MED ORDER — DEXMETHYLPHENIDATE ER 20 MG CAPSULE,EXTENDED RELEASE BIPHASIC50-50
20 | ORAL_CAPSULE | ORAL | 1 refills | 30.00000 days | Status: AC
Start: 2019-06-16 — End: 2019-07-14

## 2019-06-23 ENCOUNTER — Encounter: Admit: 2019-06-23 | Payer: PRIVATE HEALTH INSURANCE | Attending: Obstetrics and Gynecology | Primary: Internal Medicine

## 2019-06-23 ENCOUNTER — Ambulatory Visit: Admit: 2019-06-23 | Payer: BLUE CROSS/BLUE SHIELD | Attending: Obstetrics and Gynecology | Primary: Internal Medicine

## 2019-06-23 DIAGNOSIS — R923 Dense breast tissue: Secondary | ICD-10-CM

## 2019-06-23 DIAGNOSIS — I499 Cardiac arrhythmia, unspecified: Secondary | ICD-10-CM

## 2019-06-23 DIAGNOSIS — R002 Palpitations: Secondary | ICD-10-CM

## 2019-06-23 DIAGNOSIS — F341 Dysthymic disorder: Secondary | ICD-10-CM

## 2019-06-23 DIAGNOSIS — F909 Attention-deficit hyperactivity disorder, unspecified type: Secondary | ICD-10-CM

## 2019-06-23 DIAGNOSIS — Z01419 Encounter for gynecological examination (general) (routine) without abnormal findings: Secondary | ICD-10-CM

## 2019-06-23 DIAGNOSIS — I10 Essential (primary) hypertension: Secondary | ICD-10-CM

## 2019-06-23 DIAGNOSIS — IMO0001 Reflux: Secondary | ICD-10-CM

## 2019-06-23 DIAGNOSIS — Z1231 Encounter for screening mammogram for malignant neoplasm of breast: Secondary | ICD-10-CM

## 2019-06-23 DIAGNOSIS — J189 Pneumonia, unspecified organism: Secondary | ICD-10-CM

## 2019-06-23 DIAGNOSIS — K219 Gastro-esophageal reflux disease without esophagitis: Secondary | ICD-10-CM

## 2019-06-23 DIAGNOSIS — R55 Syncope and collapse: Secondary | ICD-10-CM

## 2019-06-26 NOTE — Other
Normal pap, neg HPV/

## 2019-07-14 MED ORDER — DEXMETHYLPHENIDATE ER 20 MG CAPSULE,EXTENDED RELEASE BIPHASIC50-50
20 | ORAL_CAPSULE | ORAL | 1 refills | 30.00000 days | Status: AC
Start: 2019-07-14 — End: 2019-08-11

## 2019-07-16 ENCOUNTER — Encounter: Payer: Self-pay | Admitting: Emergency Medicine

## 2019-07-16 ENCOUNTER — Ambulatory Visit
Admission: EM | Admit: 2019-07-16 | Discharge: 2019-07-16 | Disposition: A | Discharge status: Home / Self Care | Payer: Managed Care, Other (non HMO) | Attending: Family Medicine | Admitting: Family Medicine

## 2019-07-16 ENCOUNTER — Other Ambulatory Visit: Payer: Self-pay

## 2019-07-16 ENCOUNTER — Ambulatory Visit (INDEPENDENT_AMBULATORY_CARE_PROVIDER_SITE_OTHER): Payer: Managed Care, Other (non HMO)

## 2019-07-16 DIAGNOSIS — S060X0A Concussion without loss of consciousness, initial encounter: Secondary | ICD-10-CM | POA: Diagnosis not present

## 2019-07-16 MED ORDER — NAPROXEN 500 MG PO TABS: 500.0000 mg | ORAL_TABLET | Freq: Two times a day (BID) | ORAL | 0 refills | Status: AC | PRN

## 2019-07-16 NOTE — Discharge Instructions (Addendum)
Rest - cognitive and physical.  Decrease screen time.  If vision changes persist, see [facility].  Take care  Dr. Adriana Simas

## 2019-07-16 NOTE — ED Triage Notes (Signed)
Patient c/o falling on Saturday. She states she fell backwards and hit her right side, right elbow and she hit the back of her head. She states she immediately had a headache. She denies LOC. Denies vomiting. She is c/o blurred vision that started on Monday. She states this is worse when she is outside or on the computer. She continues to have intermittent headache.

## 2019-07-16 NOTE — ED Provider Notes (Signed)
[facility]    CSN: [id number] Arrival date & time: 07/16/19  1315  History   Chief Complaint Chief Complaint  Patient presents with  . Blurred Vision  . Fall   HPI  59 year old female presents with the above complaints.  Patient suffered a fall on Saturday.  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  She was pulling on a rope and providing tension to control the tree.  She states that there was a knot in the rope and it subsequently came loose and she fell backwards.  She states that she fell and hit the back of her head on gravel.  She states that she immediately had a frontal headache.  She denies loss of conscious.  No nausea vomiting.  Patient reports that on Sunday she was sore and had neck pain.  She states that on Monday she felt like she had issues with her vision.  She states that her vision seems blurry and foggy tickly when she is looking at computer screens or when she is outside.  She reports intermittent headache.  No other associated symptoms.  No complaints.  Patient Active Problem List   Diagnosis Date Noted  . Hx of skin cancer, basal cell 02/07/2018  . Obesity 02/07/2018  . High cholesterol 12/11/2016  . Low vitamin B12 level 12/11/2016  . 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 Lung nodule seen on imaging study 11/13/2014  . DJD (degenerative joint disease) 05/14/2013    Past Surgical History:  Procedure Laterality Date  . CESAREAN SECTION    . CHOLECYSTECTOMY    . KNEE SURGERY    . LAPAROSCOPIC GASTRIC SLEEVE RESECTION      OB History   No obstetric history on file.      Home Medications    Prior to Admission medications   Medication Sig Start Date End Date Taking? Authorizing Provider  naproxen (NAPROSYN) 500 MG tablet Take 1 tablet (500 mg total) by mouth 2 (two) times daily as needed for moderate pain or headache. 07/16/19   Tommie Sams, DO  pantoprazole (PROTONIX) 20 MG tablet Take 1 tablet (20 mg total) by  mouth daily for 7 days. 09/29/17 10/06/17  Nita Sickle, MD    Family History Family History  Problem Relation Age of Onset  . Hypertension Mother     Social History Social History   Tobacco Use  . Smoking status: Never Smoker  . Smokeless tobacco: Never Used  Substance Use Topics  . Alcohol use: Yes  . Drug use: No     Allergies   Nitrofurantoin   Review of Systems Review of Systems  Constitutional: Negative.   HENT:       Fall, head injury.   Eyes: Positive for visual disturbance.  Neurological: Positive for headaches.   Physical Exam Triage Vital Signs ED Triage Vitals  Enc Vitals Group     BP 07/16/19 1347 121/80     Pulse Rate 07/16/19 1347 74     Resp 07/16/19 1347 18     Temp 07/16/19 1347 98.2 F (36.8 C)     Temp Source 07/16/19 1347 Oral     SpO2 07/16/19 1347 100 %     Weight 07/16/19 1349 205 lb (93 kg)     Height 07/16/19 1349 5\' 9"  (1.753 m)     Head Circumference --      Peak Flow --      Pain Score 07/16/19 1349 1  Pain Loc --      Pain Edu? --      Excl. in GC? --    Updated Vital Signs BP 121/80 (BP Location: Right Arm)   Pulse 74   Temp 98.2 F (36.8 C) (Oral)   Resp 18   Ht 5\' 9"  (1.753 m)   Wt 93 kg   SpO2 100%   BMI 30.27 kg/m   Visual Acuity Right Eye Distance:   Left Eye Distance:   Bilateral Distance:    Right Eye Near:   Left Eye Near:    Bilateral Near:     Physical Exam Vitals and nursing note reviewed.  Constitutional:      General: She is not in acute distress.    Appearance: Normal appearance. She is not ill-appearing.  HENT:     Head: Normocephalic and atraumatic.     Right Ear: Tympanic membrane normal.     Left Ear: Tympanic membrane normal.     Mouth/Throat:     Pharynx: Oropharynx is clear. No oropharyngeal exudate or posterior oropharyngeal erythema.  Eyes:     General:        Right eye: No discharge.        Left eye: No discharge.     Conjunctiva/sclera: Conjunctivae normal.    Cardiovascular:     Rate and Rhythm: Normal rate and regular rhythm.     Heart sounds: No murmur.  Pulmonary:     Effort: Pulmonary effort is normal.     Breath sounds: No wheezing, rhonchi or rales.  Skin:    General: Skin is warm.     Findings: No rash.  Neurological:     General: No focal deficit present.     Mental Status: She is alert and oriented to person, place, and time.     Cranial Nerves: No cranial nerve deficit.     Motor: No weakness.  Psychiatric:        Mood and Affect: Mood normal.        Behavior: Behavior normal.    UC Treatments / Results  Labs (all labs ordered are listed, but only abnormal results are displayed) Labs Reviewed - No data to display  EKG   Radiology DG Cervical Spine Complete  Result Date: 07/16/2019 CLINICAL DATA:  Pain following recent fall EXAM: CERVICAL SPINE - COMPLETE 4+ VIEW COMPARISON:  None. FINDINGS: Frontal, lateral, open-mouth odontoid, and bilateral oblique views were obtained. There is no fracture or spondylolisthesis. Prevertebral soft tissues and predental space regions are normal. There is moderate disc space narrowing at C5-6 and C6-7. There are anterior osteophytes at C5 and C6. There is calcification in the anterior ligament at C5-6 and C6-7. There is facet hypertrophy with mild exit foraminal narrowing at C4-5, C5-6, and C6-7 bilaterally. Lung apices are clear. 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 No fracture or spondylolisthesis. Electronically Signed   By: 07/18/2019 III M.D.   On: 07/16/2019 14:45    Procedures Procedures (including critical care time)  Medications Ordered in UC Medications - No data to display  Initial Impression / Assessment and Plan / UC Course  I have reviewed the triage vital signs and the nursing notes.  Pertinent labs & imaging results that were available during my care of the patient were reviewed by me and considered in my medical  decision making (see chart for details).    59 year old female presents for evaluation after suffering a fall.  X-ray of  the neck was obtained and was independently reviewed by me.  Interpretation: Degenerative changes noted.  No acute fracture.  Clinically, patient appears to be suffering from concussion.  Advised cognitive and physical rest.  Naproxen as directed for pain.  Supportive care.  Work note given.   Final Clinical Impressions(s) / UC Diagnoses   Final diagnoses:  Concussion without loss of consciousness, initial encounter     Discharge Instructions     Rest - cognitive and physical.  Decrease screen time.  If vision changes persist, see [facility].  Take care  Dr. Lacinda Axon    ED Prescriptions    Medication Sig Dispense Auth. Provider   naproxen (NAPROSYN) 500 MG tablet Take 1 tablet (500 mg total) by mouth 2 (two) times daily as needed for moderate pain or headache. 30 tablet Coral Spikes, DO     PDMP not reviewed this encounter.   Coral Spikes, [place] 07/16/19 1558

## 2019-08-11 MED ORDER — DEXMETHYLPHENIDATE ER 20 MG CAPSULE,EXTENDED RELEASE BIPHASIC50-50
20 | ORAL_CAPSULE | ORAL | 1 refills | 30.00000 days | Status: AC
Start: 2019-08-11 — End: 2019-09-08

## 2019-08-11 MED ORDER — LOSARTAN 100 MG-HYDROCHLOROTHIAZIDE 12.5 MG TABLET
100-12.5 | ORAL_TABLET | ORAL | 3 refills | 90.00000 days | Status: AC
Start: 2019-08-11 — End: 2020-04-26

## 2019-08-18 MED ORDER — DULOXETINE 60 MG CAPSULE,DELAYED RELEASE
60 | ORAL_CAPSULE | ORAL | 3 refills | 45.00000 days | Status: AC
Start: 2019-08-18 — End: 2019-08-26

## 2019-08-26 MED ORDER — DULOXETINE 60 MG CAPSULE,DELAYED RELEASE
60 | ORAL_CAPSULE | ORAL | 3 refills | 45.00000 days | Status: AC
Start: 2019-08-26 — End: 2020-05-25

## 2019-09-08 MED ORDER — DEXMETHYLPHENIDATE ER 20 MG CAPSULE,EXTENDED RELEASE BIPHASIC50-50
20 | ORAL_CAPSULE | ORAL | 1 refills | 30.00000 days | Status: AC
Start: 2019-09-08 — End: 2019-12-01

## 2019-10-21 IMAGING — DX DG ABDOMEN 1V
1 series · 1 of 1 positions shown · non-contrast
Comparison: None.

CLINICAL DATA: Constipation

EXAM:
ABDOMEN - 1 VIEW

[abdomen kub]
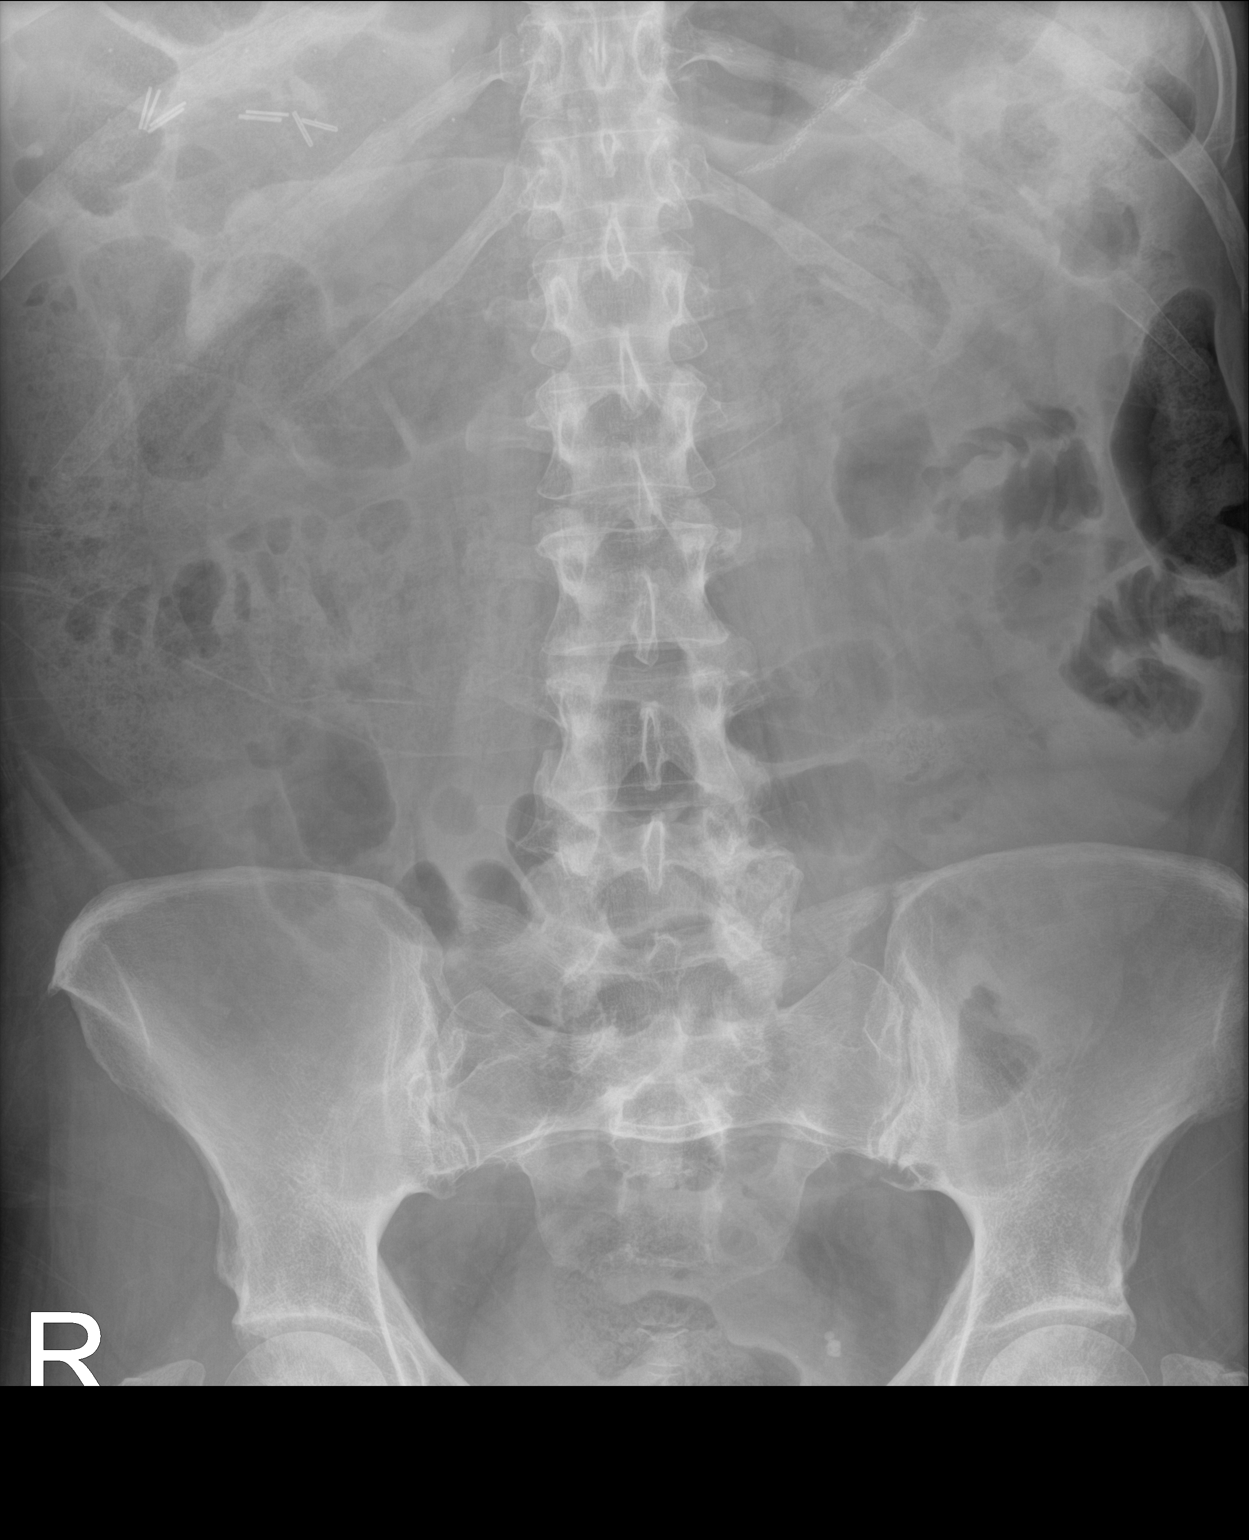

[1 of 1 positions shown; findings below may reference images not displayed]

FINDINGS: Large amount of stool in the ascending and transverse colon. There
is no bowel dilatation to suggest obstruction. There is no evidence
of pneumoperitoneum, portal venous gas or pneumatosis.

There are no pathologic calcifications along the expected course of
the ureters.

There is no acute osseous abnormality.
IMPRESSION: Large amount of stool in the ascending and transverse colon.

## 2019-11-10 MED ORDER — METOPROLOL SUCCINATE ER 25 MG TABLET,EXTENDED RELEASE 24 HR
25 | ORAL_TABLET | ORAL | 3 refills | 90.00000 days | Status: AC
Start: 2019-11-10 — End: 2020-07-16

## 2019-12-01 MED ORDER — DEXMETHYLPHENIDATE ER 20 MG CAPSULE,EXTENDED RELEASE BIPHASIC50-50
20 | ORAL_CAPSULE | ORAL | 1 refills | 30.00000 days | Status: AC
Start: 2019-12-01 — End: 2020-01-27

## 2020-01-08 ENCOUNTER — Encounter: Admit: 2020-01-08 | Payer: PRIVATE HEALTH INSURANCE | Attending: Obstetrics and Gynecology | Primary: Internal Medicine

## 2020-01-10 ENCOUNTER — Ambulatory Visit: Admit: 2020-01-10 | Payer: BLUE CROSS/BLUE SHIELD | Primary: Internal Medicine

## 2020-01-10 DIAGNOSIS — Z20828 Contact with and (suspected) exposure to other viral communicable diseases: Secondary | ICD-10-CM

## 2020-01-10 DIAGNOSIS — Z20822 Contact with and (suspected) exposure to covid-19: Secondary | ICD-10-CM

## 2020-01-12 LAB — COVID-19 CLEARANCE OR FOR PLACEMENT ONLY: BKR SARS-COV-2 RNA (COVID-19) (YH): NOT DETECTED

## 2020-01-16 MED ORDER — BUPROPION HCL XL 150 MG 24 HR TABLET, EXTENDED RELEASE
150 | ORAL_TABLET | Freq: Every morning | ORAL | 6 refills | 90.00000 days | Status: AC
Start: 2020-01-16 — End: 2020-05-24

## 2020-01-26 ENCOUNTER — Encounter: Admit: 2020-01-26 | Payer: PRIVATE HEALTH INSURANCE | Attending: Obstetrics and Gynecology | Primary: Internal Medicine

## 2020-01-27 MED ORDER — DEXMETHYLPHENIDATE ER 20 MG CAPSULE,EXTENDED RELEASE BIPHASIC50-50
20 | ORAL_CAPSULE | ORAL | 1 refills | 30.00000 days | Status: AC
Start: 2020-01-27 — End: 2020-03-01

## 2020-03-01 MED ORDER — DEXMETHYLPHENIDATE ER 20 MG CAPSULE,EXTENDED RELEASE BIPHASIC50-50
20 | ORAL_CAPSULE | ORAL | 1 refills | 30.00000 days | Status: AC
Start: 2020-03-01 — End: 2020-04-28

## 2020-03-30 MED ORDER — OMEPRAZOLE 40 MG CAPSULE,DELAYED RELEASE
40 | ORAL_CAPSULE | ORAL | 12 refills | 90.00000 days | Status: AC
Start: 2020-03-30 — End: 2021-04-08

## 2020-04-26 MED ORDER — LOSARTAN 100 MG-HYDROCHLOROTHIAZIDE 12.5 MG TABLET
100-12.5 | ORAL_TABLET | ORAL | 3 refills | 90.00000 days | Status: AC
Start: 2020-04-26 — End: 2021-04-08

## 2020-04-27 MED ORDER — DEXMETHYLPHENIDATE ER 20 MG CAPSULE,EXTENDED RELEASE BIPHASIC50-50
20 | ORAL_CAPSULE | ORAL | 1 refills | 30.00000 days | Status: AC
Start: 2020-04-27 — End: 2020-05-25

## 2020-05-07 IMAGING — CR DG ABDOMEN ACUTE W/ 1V CHEST
1 series · 4 of 4 positions shown · non-contrast
Comparison: None.

CLINICAL DATA: Epigastric abdominal pain starting this morning with
vomiting.

EXAM:
DG ABDOMEN ACUTE W/ 1V CHEST

[Series 1: dg abd acute w/chest · 0.14mm/px · 4 of 4 slices shown]
[im 1/4]
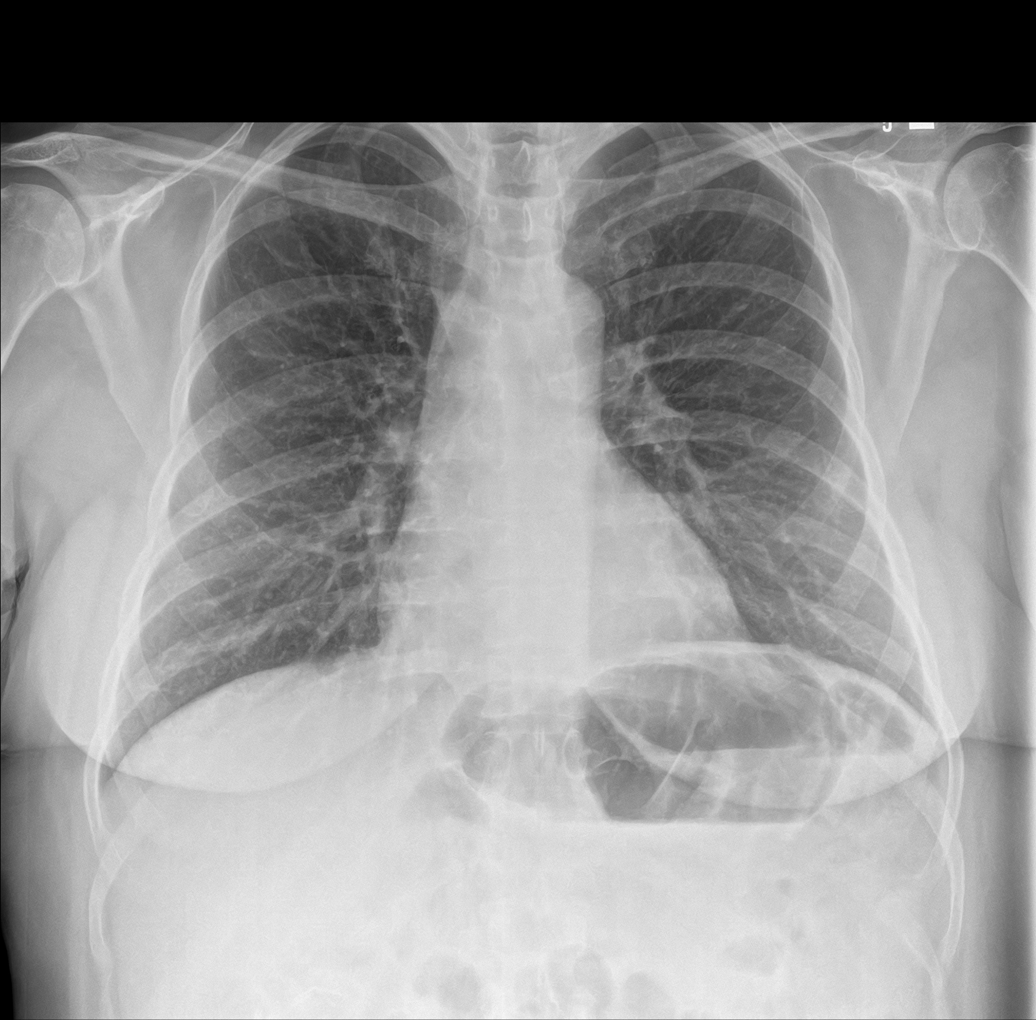
[im 2/4]
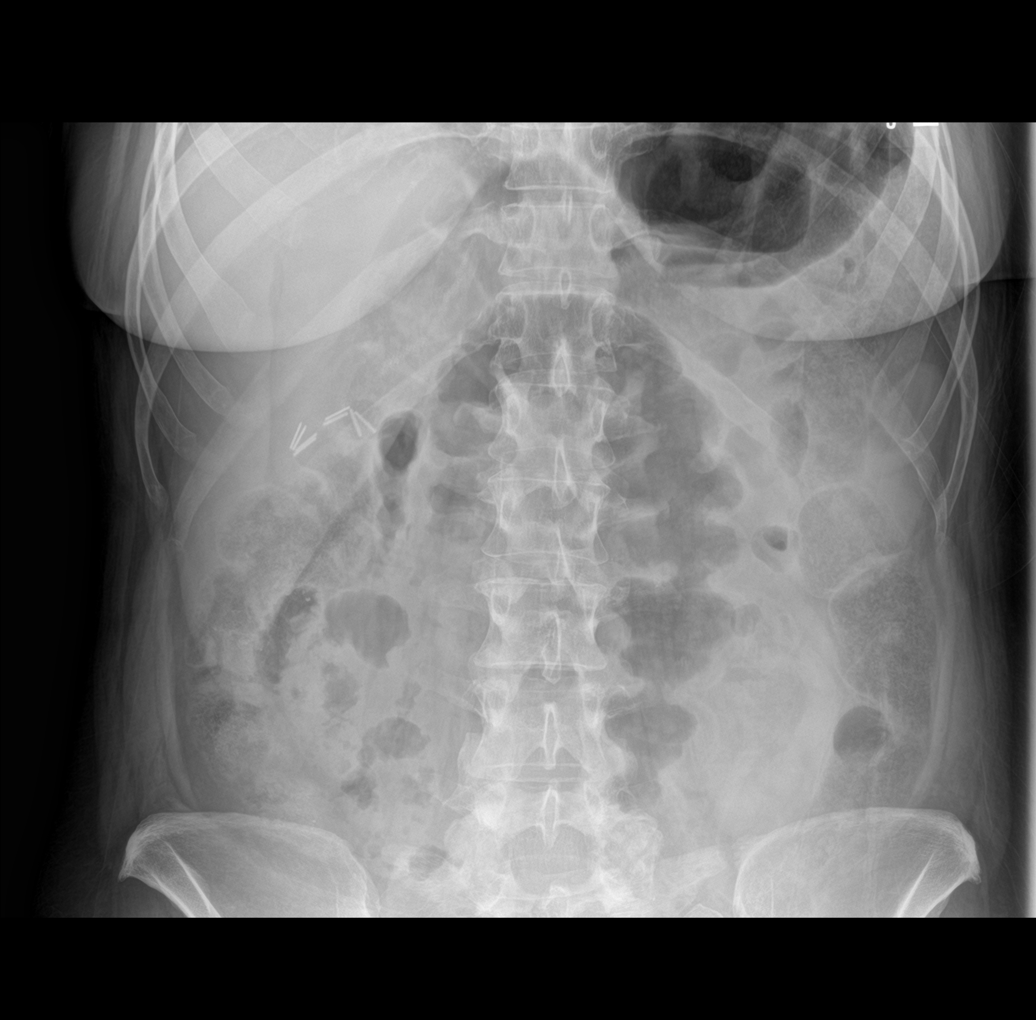
[im 3/4]
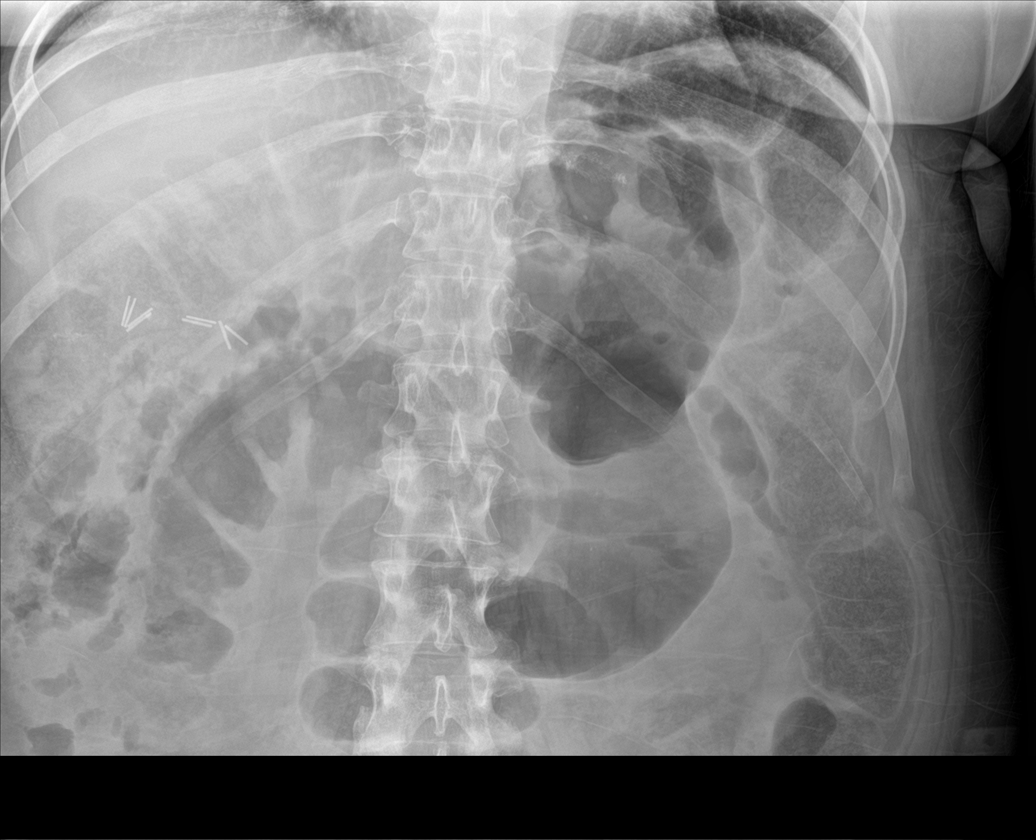
[im 4/4]
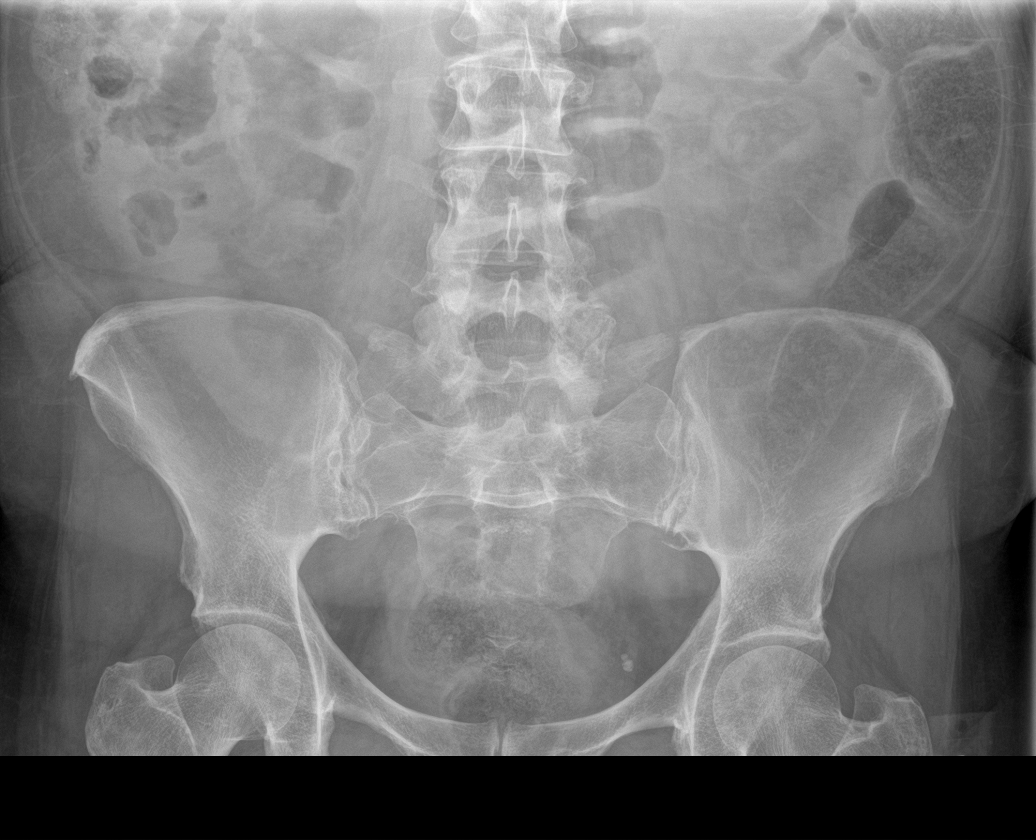

[4 of 4 positions shown; findings below may reference images not displayed]

FINDINGS: Normal cardiac and mediastinal contours without alveolar
consolidation, CHF, effusion or pneumothorax. Moderate stool
retention within the colon. Scattered air containing small bowel
loops without obstructive pattern. Findings may represent a mild
enteritis. Gas and stool is noted in the rectum. No acute osseous
abnormality. Lower lumbar facet arthropathy at L5-S1.
Cholecystectomy clips are seen in the right upper quadrant. No
radiopaque calculi.
IMPRESSION: 1. No acute cardiopulmonary disease.
2. A few scattered gas containing, mildly distended small bowel
loops are noted in a nonobstructive bowel gas pattern. Findings may
represent a small bowel enteritis. Increased colonic stool burden is
also noted.

## 2020-05-24 MED ORDER — CHOLECALCIFEROL (VITAMIN D3) 1,250 MCG (50,000 UNIT) CAPSULE
1250 | ORAL_CAPSULE | ORAL | 3 refills | 84.00000 days | Status: AC
Start: 2020-05-24 — End: 2020-11-05

## 2020-05-24 MED ORDER — DEXMETHYLPHENIDATE 10 MG TABLET
10 | ORAL_TABLET | ORAL | 1 refills | 30.00000 days | Status: AC
Start: 2020-05-24 — End: 2020-12-16

## 2020-05-25 MED ORDER — DEXMETHYLPHENIDATE ER 20 MG CAPSULE,EXTENDED RELEASE BIPHASIC50-50
20 | ORAL_CAPSULE | ORAL | 1 refills | 30.00000 days | Status: AC
Start: 2020-05-25 — End: 2020-07-05

## 2020-05-25 MED ORDER — DULOXETINE 60 MG CAPSULE,DELAYED RELEASE
60 | ORAL_CAPSULE | ORAL | 3 refills | 45.00000 days | Status: AC
Start: 2020-05-25 — End: 2021-04-08

## 2020-07-05 MED ORDER — DEXMETHYLPHENIDATE ER 20 MG CAPSULE,EXTENDED RELEASE BIPHASIC50-50
20 | ORAL_CAPSULE | ORAL | 1 refills | 30.00000 days | Status: AC
Start: 2020-07-05 — End: 2020-08-24

## 2020-07-16 MED ORDER — METOPROLOL SUCCINATE ER 25 MG TABLET,EXTENDED RELEASE 24 HR
25 | ORAL_TABLET | ORAL | 3 refills | 90.00000 days | Status: AC
Start: 2020-07-16 — End: 2021-08-16

## 2020-08-24 MED ORDER — DEXMETHYLPHENIDATE ER 20 MG CAPSULE,EXTENDED RELEASE BIPHASIC50-50
20 | ORAL_CAPSULE | ORAL | 1 refills | 30.00000 days | Status: AC
Start: 2020-08-24 — End: 2020-09-20

## 2020-09-20 MED ORDER — DEXMETHYLPHENIDATE ER 20 MG CAPSULE,EXTENDED RELEASE BIPHASIC50-50
20 | ORAL_CAPSULE | ORAL | 1 refills | 30.00000 days | Status: AC
Start: 2020-09-20 — End: 2020-11-15

## 2020-11-15 MED ORDER — DEXMETHYLPHENIDATE ER 20 MG CAPSULE,EXTENDED RELEASE BIPHASIC50-50
20 | ORAL_CAPSULE | ORAL | 1 refills | 30.00000 days | Status: AC
Start: 2020-11-15 — End: 2020-12-21

## 2020-11-16 ENCOUNTER — Encounter: Admit: 2020-11-16 | Payer: BLUE CROSS/BLUE SHIELD | Attending: Obstetrics and Gynecology | Primary: Internal Medicine

## 2020-12-13 ENCOUNTER — Encounter: Admit: 2020-12-13 | Payer: BLUE CROSS/BLUE SHIELD | Attending: Obstetrics and Gynecology | Primary: Internal Medicine

## 2020-12-16 MED ORDER — DEXMETHYLPHENIDATE 10 MG TABLET
10 | ORAL_TABLET | ORAL | 1 refills | 30.00000 days | Status: AC
Start: 2020-12-16 — End: 2021-01-10

## 2020-12-21 MED ORDER — DEXMETHYLPHENIDATE ER 20 MG CAPSULE,EXTENDED RELEASE BIPHASIC50-50
20 | ORAL_CAPSULE | ORAL | 1 refills | 30.00000 days | Status: AC
Start: 2020-12-21 — End: 2021-04-08

## 2020-12-27 ENCOUNTER — Ambulatory Visit: Admit: 2020-12-27 | Payer: BLUE CROSS/BLUE SHIELD | Attending: Obstetrics and Gynecology | Primary: Internal Medicine

## 2020-12-27 ENCOUNTER — Encounter: Admit: 2020-12-27 | Payer: PRIVATE HEALTH INSURANCE | Attending: Obstetrics and Gynecology | Primary: Internal Medicine

## 2020-12-27 DIAGNOSIS — R55 Syncope and collapse: Secondary | ICD-10-CM

## 2020-12-27 DIAGNOSIS — F909 Attention-deficit hyperactivity disorder, unspecified type: Secondary | ICD-10-CM

## 2020-12-27 DIAGNOSIS — F341 Dysthymic disorder: Secondary | ICD-10-CM

## 2020-12-27 DIAGNOSIS — K219 Gastro-esophageal reflux disease without esophagitis: Secondary | ICD-10-CM

## 2020-12-27 DIAGNOSIS — I10 Essential (primary) hypertension: Secondary | ICD-10-CM

## 2020-12-27 DIAGNOSIS — Z1231 Encounter for screening mammogram for malignant neoplasm of breast: Secondary | ICD-10-CM

## 2020-12-27 DIAGNOSIS — J189 Pneumonia, unspecified organism: Secondary | ICD-10-CM

## 2020-12-27 DIAGNOSIS — IMO0001 Reflux: Secondary | ICD-10-CM

## 2020-12-27 DIAGNOSIS — Z01419 Encounter for gynecological examination (general) (routine) without abnormal findings: Secondary | ICD-10-CM

## 2020-12-27 DIAGNOSIS — R002 Palpitations: Secondary | ICD-10-CM

## 2020-12-27 DIAGNOSIS — I499 Cardiac arrhythmia, unspecified: Secondary | ICD-10-CM

## 2020-12-27 NOTE — Progress Notes
Kristie Frye is a 60 y.o. G65P2002 female who presents to this practice for an annual exam.  Denies bleeding, discharge, painPatient's last menstrual period was 02/14/2013 (approximate).Patient History?	Problem List: has Plantar fasciitis; Obesity; Chronic rhinitis; IBS (irritable bowel syndrome); Varicose vein; Depression; Anxiety; Acne rosacea; GERD (gastroesophageal reflux disease); HTN (hypertension); Postmenopausal bleeding; Seborrheic dermatitis; Vitamin D deficiency; High cholesterol; Adenomatous polyp of ascending colon- colon resection  1/17; Pneumonia of right lower lobe due to infectious organism; Hypoxia; and Sinus tachycardia on their problem list. ?	Past Surgical History: has a past surgical history that includes Ablation saphenous vein w/ RFA; Urethral sling; and Laparoscopic colon resection.?	Past Medical History: has a past medical history of ADHD (attention deficit hyperactivity disorder), Arrhythmia, Dysthymia, GERD (gastroesophageal reflux disease), Heart palpitations, Hypertension, Pneumonia, Reflux, and Syncope.?	Family History: family history includes Breast cancer in her paternal aunt; Coronary Artery Disease in her mother; Diabetes in her brother, brother, father, and mother; Heart disease in her mother; Hypertension in her brother and brother; Obesity in her brother and brother; Pulmonary embolism in her mother; Sleep apnea in her brother and brother; Thrombophilia in her mother.?	Allergies:is allergic to wheat containing prod. Medications: Current Outpatient Medications: ?  dexmethylphenidate, TAKE 1 CAPSULE BY MOUTH DAILY?  DULoxetine, Take one capsule daily?  ERGOCALCIFEROL, VITAMIN D2, ORAL, 1 tablet, Oral, Daily?  losartan-hydrochlorothiazide, TAKE 1 TABLET BY MOUTH DAILY?  metoprolol succinate XL, TAKE 1 TABLET BY MOUTH DAILY WITH OR IMMEDIATELY FOLLOWING A MEAL?  metroNIDAZOLE, Apply topically 2 (two) times daily.?	?  omeprazole, TAKE 1 CAPSULE BY MOUTH DAILY ?	Social History: reports that she has never smoked. She has never used smokeless tobacco. She reports current alcohol use of about 10.0 standard drinks of alcohol per week. She reports that she does not use drugs. ?	Menstrual/Sexual History:married, has a wife ?	 OB History Gravida Para Term Preterm AB Living 2 2 2  0 0 2 SAB TAB Ectopic Molar Multiple Live Births 0 0 0   0 2  # Outcome Date GA Lbr Len/2nd Weight Sex Delivery Anes PTL Lv 2 Term 2006    F Vag-Spont   LIV 1 Term 2004    M Vag-Spont   LIV ROS: negative Objective: ?	BP 132/78  - Temp (!) 95.2 ?F (35.1 ?C) (Temporal)  - Ht 5' 8 (1.727 m)  - Wt 127.5 kg  - LMP 02/14/2013 (Approximate)  - BMI 42.73 kg/m?  Physical Exam ?	General:  Appears well, no distress		?	Breast Exam:Normal appearance, no masses or tenderness?	Abdomen: soft, nontender, no masses palpated, no hepatosplenomegaly	?	External Genitalia: General appearance; normal	?	Uterus:Normal size, contour, non-tender?	Vagina: Normal appearance	?	Cervix::Cervix has normal appearance			  ?	Adnexa: no palpable masses?	Rectal: deferred?	Health Maintenance: Colonoscopy: scheduledDexa Bone Density:  Mammogram:  orderedBreast HY:QMVHQ:  completedAssessment / Plan: Kristie Frye is a 60 y.o. G9P2002 female without any gyn complaintsPap guidelines reviewed?	Marinell Blight Primitivo Gauze, MD

## 2020-12-27 NOTE — Progress Notes
ZOX:WRUEA E Mascari is a 60 y.o. G70P2002 female who presents to this practice for an annual exam.  Denies bleeding, discharge, painPatient's last menstrual period was 02/14/2013 (approximate). Lost wife last year-brain tumor [id number] son Beverely Pace 13 daughter-no gyn issues Patient History?	Problem List: has Varicose vein; Depression; Anxiety; Acne rosacea; GERD (gastroesophageal reflux disease); HTN (hypertension); Seborrheic dermatitis; Vitamin D deficiency; High cholesterol; Colon cancer screening- 6/21 2 adenoma polyps repeat 3 years dr Camie Patience ; and Morbid obesity with BMI of 40.0-44.9, adult (HC CODE) (HC Code) on their problem list. ?	Past Surgical History: has a past surgical history that includes Ablation saphenous vein w/ RFA; Urethral sling; and Laparoscopic colon resection.?	Past Medical History: has a past medical history of ADHD (attention deficit hyperactivity disorder), Arrhythmia, Dysthymia, GERD (gastroesophageal reflux disease), Heart palpitations, Hypertension, Pneumonia, Reflux, and Syncope.?	Family History: family history includes Breast cancer in her paternal aunt; Coronary Artery Disease in her mother; Diabetes in her brother, brother, father, and mother; Heart disease in her mother; Hypertension in her brother and brother; Obesity in her brother and brother; Pulmonary embolism in her mother; Sleep apnea in her brother and brother; Thrombophilia in her mother.?	Allergies:has no active allergies. Medications: Current Outpatient Medications: ?  dexmethylphenidate, TAKE 1 CAPSULE BY MOUTH DAILY?  dexmethylphenidate, Take 1 tablet by mouth twice a day?  DULoxetine, TAKE 1 CAPSULE BY MOUTH DAILY?  ERGOCALCIFEROL, VITAMIN D2, ORAL, 1 tablet, Oral, Daily?  losartan-hydrochlorothiazide, TAKE 1 TABLET BY MOUTH DAILY?  metoprolol succinate XL, TAKE 1 TABLET BY MOUTH DAILY WITH OR IMMEDIATELY FOLLOWING A MEAL?	?  omeprazole, TAKE 1 CAPSULE BY MOUTH DAILY ?	Social History: reports that she has never smoked. She has never used smokeless tobacco. She reports that she does not use drugs. ?	Menstrual/Sexual History: wife died 6 months ago- brain tumor ?	 OB History Gravida Para Term Preterm AB Living 2 2 2  0 0 2 SAB IAB Ectopic Molar Multiple Live Births 0 0 0   0 2  # Outcome Date GA Lbr Len/2nd Weight Sex Delivery Anes PTL Lv 2 Term 2006    F Vag-Spont   LIV 1 Term 2004    M Vag-Spont   LIV ROS: negative Objective: ?	BP 126/86  - Temp 97.2 ?F (36.2 ?C) (Temporal)  - Ht 5' 8 (1.727 m)  - Wt 125.2 kg  - LMP 02/14/2013 (Approximate)  - BMI 41.97 kg/m?  Physical Exam Neuro - Alert and oriented x3?	Gen - appears well, no distress?	Psych - patient answers all questions appropriately with normal affect?	Neck - normal appearance?	Resp - no increased work of breathing?	Abd - soft, non tender, non distended. No masses palpated?	Musculoskeletal - grossly normal?	Skin - no visible lesions?	BREAST AND PELVIC EXAM:?	Breasts - symmetrical, normal appearing. No masses, lymphadenopathy, or skin changes noted?	 External female genitalia:  without lesions or erythema. ?	?	Urethral meatus: normal in appearance. ?	?	Vagina: No abnormal vaginal discharge, lesions  ?	?	Cervix: without visible lesions, no CMT?	?	Uterus:normal size, shape and consistency?	?	Adnexa: no palpable masses. Non tender?	?	Rectal: deferred, recent colonoscopy?	Health Maintenance: Colonoscopy: 2021 Tubular adenoma F/u 3 yrs?Dexa Bone Density:  Mammogram:  ordered-MadisonBreast [id number]:  2021 Assessment / Plan: DELAINA FETSCH is a 60 y.o. G69P2002 female without any gyn complaintsPap guidelines reviewed?	Marinell Blight Primitivo Gauze, MD

## 2021-01-10 MED ORDER — DEXMETHYLPHENIDATE 10 MG TABLET
10 | ORAL_TABLET | ORAL | 1 refills | 30.00000 days | Status: AC
Start: 2021-01-10 — End: 2021-09-19

## 2021-04-07 ENCOUNTER — Encounter: Admit: 2021-04-07 | Payer: PRIVATE HEALTH INSURANCE | Attending: Internal Medicine | Primary: Internal Medicine

## 2021-04-08 MED ORDER — DEXMETHYLPHENIDATE ER 20 MG CAPSULE,EXTENDED RELEASE BIPHASIC50-50
20 | ORAL_CAPSULE | ORAL | 1 refills | 30.00000 days | Status: AC
Start: 2021-04-08 — End: 2021-05-04

## 2021-04-08 MED ORDER — DULOXETINE 60 MG CAPSULE,DELAYED RELEASE
60 | ORAL_CAPSULE | ORAL | 3 refills | 45.00000 days | Status: AC
Start: 2021-04-08 — End: 2022-01-19

## 2021-04-08 MED ORDER — OMEPRAZOLE 40 MG CAPSULE,DELAYED RELEASE
40 | ORAL_CAPSULE | ORAL | 12 refills | 90.00000 days | Status: AC
Start: 2021-04-08 — End: 2022-05-01

## 2021-04-08 MED ORDER — LOSARTAN 100 MG-HYDROCHLOROTHIAZIDE 12.5 MG TABLET
100-12.5 | ORAL_TABLET | ORAL | 3 refills | 90.00000 days | Status: AC
Start: 2021-04-08 — End: 2022-01-04

## 2021-04-26 ENCOUNTER — Ambulatory Visit
Admission: EM | Admit: 2021-04-26 | Discharge: 2021-04-26 | Disposition: A | Discharge status: Home / Self Care | Payer: Managed Care, Other (non HMO)

## 2021-04-26 ENCOUNTER — Other Ambulatory Visit: Payer: Self-pay

## 2021-04-26 DIAGNOSIS — R002 Palpitations: Secondary | ICD-10-CM | POA: Diagnosis not present

## 2021-04-26 HISTORY — DX: Pure hypercholesterolemia, unspecified: E78.00

## 2021-04-26 NOTE — Discharge Instructions (Signed)
Get a blood pressure cuff and measure your blood pressure periodically. ? ?Improve your oral hydration and increase your oral water intake to a goal of 64 ounces daily. ? ?Follow-up with your PCP if you have continued symptoms. ? ?If you develop a return of symptoms, especially if you have chest pain, shortness of breath, sweating, nausea, headache, or difficulty in speech you need to call 911 and go to the ER.  ?

## 2021-04-26 NOTE — ED Provider Notes (Signed)
[facility]    CSN: [id number] Arrival date & time: 04/26/21  1607      History   Chief Complaint Chief Complaint  Patient presents with   Tachycardia   Headache   Eye Problem    HPI Ariana Jefferson is a 61 y.o. female.   HPI  61 year old female here for evaluation of multiple complaints.  Patient reports that she was sitting on her couch doing a crossword on her phone after lunch when she experienced palpitations and feeling her heartbeat hard in her chest.  This lasted for a number of minutes and it concerned her so she went to her neighbor's house where she checked her blood pressure and her blood pressure was elevated for her as well as her heart rate being elevated for her.  She says she measured her heart rate in the 90s.  She is also concerned because for last 3 days she has been experiencing these prisms in her vision that last 3 to 5 minutes and are followed by a headache in her temple region.  She has been taking Tylenol and the headaches have been responding to the Tylenol.  She had this periodically throughout her life but has never been evaluated for them and she is never had them on subsequent days.  She denies any chest pain, shortness of breath, sweating, nausea, numbness, tingling, weakness in the upper extremities, any changes to medication or changes to activity.  She states that she has been constipated and not had a bowel movement last 7 days so she took a laxative last night.  She reports that her symptoms began just after she had used the bathroom and had a large bowel movement.  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      Home Medications    Prior to Admission medications   Medication Sig Start Date End Date Taking? Authorizing Provider  atorvastatin (LIPITOR) 10 MG tablet atorvastatin 10 mg tablet 11/08/20  Yes [provider]  naproxen (NAPROSYN) 500 MG tablet Take 1 tablet (500 mg total) by mouth 2 (two) times daily as needed for moderate pain or headache. 07/16/19   Tommie Sams, DO  pantoprazole (PROTONIX) 20 MG tablet Take 1 tablet (20 mg total) by mouth daily for 7 days. 09/29/17 10/06/17  Nita Sickle, MD  tiZANidine (ZANAFLEX) 4 MG tablet tizanidine 4 mg tablet  TAKE 1 TABLET BY MOUTH AT BEDTIME AS NEEDED    [provider]    Family History Family History  Problem Relation Age of Onset   Hypertension Mother     Social History Social History   Tobacco Use   Smoking status: Never   Smokeless tobacco: Never  Vaping Use   Vaping Use: Never used  Substance Use Topics   Alcohol use: Yes   Drug use: No  Allergies   Nitrofurantoin   Review of Systems Review of Systems  Constitutional:  Negative for diaphoresis and fever.  Eyes:  Positive for visual disturbance.  Respiratory:  Negative for cough and shortness of breath.   Cardiovascular:  Positive for palpitations. Negative for chest pain.  Gastrointestinal:  Negative for nausea.  Neurological:  Positive for headaches. Negative for dizziness, weakness and numbness.  Hematological: Negative.   Psychiatric/Behavioral: Negative.      Physical Exam Triage Vital Signs ED Triage Vitals  Enc Vitals Group     BP 04/26/21 1619 118/85     Pulse Rate 04/26/21 1619 89     Resp 04/26/21 1619 16     Temp 04/26/21 1619 98 F (36.7 C)     Temp Source 04/26/21 1619 Oral     SpO2 04/26/21 1619  99 %     Weight --      Height --      Head Circumference --      Peak Flow --      Pain Score 04/26/21 1624 4     Pain Loc --      Pain Edu? --      Excl. in GC? --    No data found.  Updated Vital Signs BP 118/85 (BP Location: Left Arm)    Pulse 89    Temp 98 F (36.7 C) (Oral)    Resp 16    SpO2 99%   Visual Acuity Right Eye Distance:   Left Eye Distance:   Bilateral Distance:    Right Eye Near:   Left Eye Near:    Bilateral Near:     Physical Exam Vitals and nursing note reviewed.  Constitutional:      Appearance: Normal appearance. She is not ill-appearing.  HENT:     Head: Normocephalic and atraumatic.     Mouth/Throat:     Mouth: Mucous membranes are moist.     Pharynx: Oropharynx is clear. No oropharyngeal exudate or posterior oropharyngeal erythema.  Eyes:     General: No scleral icterus.    Extraocular Movements: Extraocular movements intact.     Conjunctiva/sclera: Conjunctivae normal.     Pupils: Pupils are equal, round, and reactive to light.  Neck:     Vascular: No carotid bruit.  Cardiovascular:     Rate and Rhythm: Normal rate and regular rhythm.     Pulses: Normal pulses.     Heart sounds: Normal heart sounds. No murmur heard.   No friction rub. No gallop.  Pulmonary:     Effort: Pulmonary effort is normal.     Breath sounds: Normal breath sounds. No wheezing, rhonchi or rales.  Musculoskeletal:     Cervical back: Normal range of motion and neck supple.  Lymphadenopathy:     Cervical: No cervical adenopathy.  Skin:    General: Skin is warm and dry.     Capillary Refill: Capillary refill takes less than 2 seconds.     Findings: No erythema or rash.  Neurological:     General: No focal deficit present.     Mental Status: She is alert and oriented to person, place, and time.     Cranial Nerves: No cranial nerve deficit.     Sensory: No sensory deficit.     Motor: No weakness.     Coordination: Coordination normal.     Gait: Gait normal.      Deep Tendon Reflexes: Reflexes normal.  Psychiatric:  Mood and Affect: Mood normal.        Behavior: Behavior normal.        Thought Content: Thought content normal.        Judgment: Judgment normal.     UC Treatments / Results  Labs (all labs ordered are listed, but only abnormal results are displayed) Labs Reviewed - No data to display  EKG Normal sinus rhythm with a ventricular of 76 bpm Peer interval 170 ms QRS duration 86 ms QT/QTc 378/425 ms No ST or T wave abnormalities No changes when compared to 09/29/2017.   Radiology No results found.  Procedures Procedures (including critical care time)  Medications Ordered in UC Medications - No data to display  Initial Impression / Assessment and Plan / UC Course  I have reviewed the triage vital signs and the nursing notes.  Pertinent labs & imaging results that were available during my care of the patient were reviewed by me and considered in my medical decision making (see chart for details).  Is a very pleasant, nontoxic-appearing 61 year old female here for evaluation of increased heart rate, prisms in her vision, and temporal headache.  The change in vision temple headache having going on for last 3 days and the increased heart rate developed this afternoon while sitting on the couch.  Patient reports that she was playing on her phone doing a crossword puzzle after lunch.  Later she remembered that she had just had a large bowel movement after not having a bowel movement for approximately 7 days.  She took a laxative the night before.  We also discussed hydration status and she states that she drink coffee in the morning but then has not not had much to drink today at all.  She states that she typically does not drink anything between when she has coffee in the morning and when she drinks something at lunchtime.  She also has not had an eye exam in 2 years.  Her physical exam reveals pupils are equal round and reactive  and her EOM is intact.  Cranial nerves II through XII are intact.  Bilateral grips and upper extremity strength are 5/5 in bilateral lower extremity strength is 5/5.  DTRs are 2+ globally.  Cardiopulmonary exam reveals S1-S2 heart sounds that are regular rate and rhythm.  Lung sounds are clear auscultation all fields.  No bruits appreciated when auscultating over the carotid arteries.  Patient's EKG is unremarkable and demonstrates no changes when compared to September 29, 2017.  Patient's neurologic exam and cardiac exam are completely normal in clinic and she is not having any of the symptoms she was having earlier.  We discussed that her elevated heart rate may have been a result of a mild dehydration coupled with a drop in blood pressure after having a bowel movement.  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  They may also be subsequently related to some mild dehydration.  I suggested that she get a blood pressure cuff and monitor her blood pressure at home periodically, especially if her symptoms return, and record these values to discuss with her PCP.  She should make an appointment to see her PCP in any event to discuss these issues.  I also suggested that she get an eye exam to make sure there is no structural abnormalities that are leading to the eye prisms that she is seeing.  I  further cautioned her that if she has a return of symptoms, especially if they are associated with chest pain, dizziness, shortness of breath, nausea, sweating, weakness, headache, or change in speech that she needs to call 911 and go to the ER.  Patient verbalized understanding of same.   Final Clinical Impressions(s) / UC Diagnoses   Final diagnoses:  Palpitations     Discharge Instructions      Get a blood pressure cuff and measure your blood pressure periodically.  Improve your oral hydration and increase your oral water intake to a  goal of 64 ounces daily.  Follow-up with your PCP if you have continued symptoms.  If you develop a return of symptoms, especially if you have chest pain, shortness of breath, sweating, nausea, headache, or difficulty in speech you need to call 911 and go to the ER.      ED Prescriptions   None    PDMP not reviewed this encounter.   Becky Augusta, NP 04/26/21 1718

## 2021-04-26 NOTE — ED Triage Notes (Signed)
Patient presents to Urgent Care with complaints of an episode of tachycardia that started 3 hrs ago, she states her baseline is in the 60/70 range. Intermittent headache and vision changes started 3 days ago. Treated her headache with tylenol. No meds today.  ? ?Denies SOB or changes in medications.   ?

## 2021-05-04 ENCOUNTER — Encounter: Admit: 2021-05-04 | Payer: PRIVATE HEALTH INSURANCE | Attending: Internal Medicine | Primary: Internal Medicine

## 2021-05-04 MED ORDER — DEXMETHYLPHENIDATE ER 20 MG CAPSULE,EXTENDED RELEASE BIPHASIC50-50
20 | ORAL_CAPSULE | ORAL | 1 refills | 30.00000 days | Status: AC
Start: 2021-05-04 — End: 2021-06-13

## 2021-06-13 ENCOUNTER — Encounter: Admit: 2021-06-13 | Payer: PRIVATE HEALTH INSURANCE | Attending: Internal Medicine | Primary: Internal Medicine

## 2021-06-13 MED ORDER — DEXMETHYLPHENIDATE ER 20 MG CAPSULE,EXTENDED RELEASE BIPHASIC50-50
20 | ORAL_CAPSULE | ORAL | 1 refills | 30.00000 days | Status: AC
Start: 2021-06-13 — End: 2021-07-11

## 2021-07-11 ENCOUNTER — Encounter: Admit: 2021-07-11 | Payer: PRIVATE HEALTH INSURANCE | Attending: Internal Medicine | Primary: Internal Medicine

## 2021-07-11 MED ORDER — DEXMETHYLPHENIDATE ER 20 MG CAPSULE,EXTENDED RELEASE BIPHASIC50-50
20 | ORAL_CAPSULE | ORAL | 1 refills | 30.00000 days | Status: AC
Start: 2021-07-11 — End: 2021-11-01

## 2021-07-13 ENCOUNTER — Telehealth: Admit: 2021-07-13 | Payer: PRIVATE HEALTH INSURANCE | Attending: Internal Medicine | Primary: Internal Medicine

## 2021-07-13 MED ORDER — AZITHROMYCIN 250 MG TABLET
250 | ORAL_TABLET | ORAL | 1 refills | 5.00000 days | Status: AC
Start: 2021-07-13 — End: ?

## 2021-08-05 ENCOUNTER — Ambulatory Visit: Admit: 2021-08-05 | Payer: BLUE CROSS/BLUE SHIELD | Attending: Dermatology | Primary: Internal Medicine

## 2021-08-05 DIAGNOSIS — L821 Other seborrheic keratosis: Secondary | ICD-10-CM

## 2021-08-05 DIAGNOSIS — L719 Rosacea, unspecified: Secondary | ICD-10-CM

## 2021-08-05 DIAGNOSIS — L400 Psoriasis vulgaris: Secondary | ICD-10-CM

## 2021-08-05 DIAGNOSIS — Z79899 Other long term (current) drug therapy: Secondary | ICD-10-CM

## 2021-08-05 MED ORDER — IVERMECTIN 1 % TOPICAL CREAM
1 % | Freq: Two times a day (BID) | TOPICAL | 2 refills | Status: AC
Start: 2021-08-05 — End: ?

## 2021-08-05 NOTE — Patient Instructions
Kristie Frye. Kristie Gerold, MD, [facility] [address], [address] [phone] For For scalp psoriasis:Start [facility] [place] - Sam, pharmD will contact youApremilast (otezla?)Patient information:You are being prescribed Otelza (Apremilast).Take as prescribed.Otezla (Apremilast) is an oral medication that is used to treat inflammatory conditions such as psoriasis and psoriatic arthritis. It works by stopping the effects of a molecule called PDE4, which is important to the function of immune cells. About 20-30% of patients taking Otelza for psoriasis achieved clear or almost clear skin after 4 months of treatment. When starting Otezla the dose will be increased over a 6 day period in order to reduce the risk of gastrointestinal symptoms. You may take this medication with or without food. On day one, take 10 mg in the morning. On day two take 10 mg in the morning and 10 mg in the evening. On day three take 10 mg in the morning and 20 mg in the evening. On day four take 20 mg in the morning and 20 mg in the evening. On day 5, take 20 mg in the morning and 30 mg in the evening. On day six and thereafter, take 30 mg twice daily. Occasionally your doctor may slow down the dose escalation if significant nausea or diarrhea develops. Risks and monitoring include, but are not limited [id number], nausea, upper respiratory tract infection, and headache. About 10% of patients may also experience weight loss (a decrease of about 5-10% of body weight). Please tell your doctor if you are developing any new symptoms or weight loss while taking this medication.About 1% of patients treated with [facility] [place] experienced symptoms of depression. Please tell your doctor if you are experiencing any depression, suicidal ideation, or other mood changes. Patients with severe kidney disease may need to have their dose of Otezla reduced.   Taking rifampin or certain anti-seizure medications (such as phenobarbital, carbamazepine, phenytoin) with [facility] [place] is not recommended as these medications may result in loss of efficacy of Otezla by increasing its clearance by the liver. Henderson [place] is a newer medication and therefore its long-term safety profile is still being evaluated and there may be serious side effects associated with this medication that have not yet been identified.If you develop any concerning symptoms, please stop the medication and call.

## 2021-08-06 NOTE — Progress Notes
CHIEF COMPLAINT:  RashREFERRING MD:  Dr. Margarita Frye: Kristie Frye is a 61 y.o. female who presents with a dryness and scaling on her scalp for several years. Has been using a topical steroid and Selsun Blue shampoo without improvement. States the scaling and dryness have spread to her postauricular areas and into the ear canals. Pt with a history of depression but denies history of suicidality. PMH: Past Medical History: Diagnosis Date ? ADHD (attention deficit hyperactivity disorder)  ? Arrhythmia   occasional palpitation - distant past cleared by ekg ? Dysthymia  ? GERD (gastroesophageal reflux disease)  ? Heart palpitations  ? Hypertension  ? Pneumonia  ? Reflux  ? Syncope   distant past - 25+ years ago Current Outpatient Medications Medication Sig Dispense Refill ? dexmethylphenidate (FOCALIN XR) 20 mg 24 hr capsule TAKE 1 CAPSULE BY MOUTH DAILY 30 capsule 0 ? dexmethylphenidate (FOCALIN) 10 mg tablet TAKE 1 TABLET BY MOUTH TWICE A DAY (Patient taking differently: daily.) 60 tablet 0 ? DULoxetine (CYMBALTA) 60 mg capsule TAKE 1 CAPSULE BY MOUTH DAILY 90 capsule 2 ? ERGOCALCIFEROL, VITAMIN D2, ORAL Take 1 tablet by mouth daily.   ? losartan-hydrochlorothiazide (HYZAAR) 100-12.5 mg per tablet TAKE 1 TABLET BY MOUTH DAILY 90 tablet 2 ? metoprolol succinate XL (TOPROL-XL) 25 mg 24 hr tablet TAKE 1 TABLET BY MOUTH DAILY WITH OR IMMEDIATELY FOLLOWING A MEAL 90 tablet 2 ? omeprazole (PRILOSEC) 40 mg capsule TAKE 1 CAPSULE BY MOUTH DAILY 30 capsule 11 No current facility-administered medications for this visit. No Known AllergiesROS: The following were asked of the patient: No  Unanticipated change in weight	No  fevers/chillsNo  fatigueNo  new swollen lymph nodes No  recent illness/hospitatlizationNo  chest pain or shortness of breathNo  joint aches or painsThe patient's medications, allergies, problem list, and past medical, surgical and family histories were reviewed and updated as appropriate.OBJECTIVE:Physical ExaminationGeneral - no acute distress, alert and oriented, well appearingOccipital scalp with pink erythema and scaleRight postauricular area with faint erythema and scale Right external ear canal with erythema and fissure Left conchal bowl with erythema and scaleRight leg with waxy stuck on papulesMedial cheeks with erythema and telangiectasias  Patient exam or treatment required medical chaperone.The sensitive parts of the examination were performed with chaperone present: Yes; Chaperone Name, Role/Title: Donzetta Starch, LPN  IMPRESSION/PLAN:1. Psoriasis, scalp and ear - Discussed treatment options including Otezla vs Zoryve - Discussed side effects of apremilast including potential adverse effects of depression, weight loss, diarrhea, nausea and headaches, URI symptoms. The patient understands that any signs of depression or weight loss must be reported to me immediately. In the case of depression, the patient will discontinue the medication immediately.- Begin apremilast starter pack: 10 mg once daily for day 1, 10 mg twice daily (morning and night) on day 2, 10 mg in the morning and 20 mg at night on day 3, 20 mg twice daily (morning and night) on day 4, 20 mg in the morning and 30 mg at night on day 5 and 30 mg twice daily (morning and night) thereafter.2. Seborrheic keratoses, right leg - Discussed benign nature and provided reassurance- Discussed that patient should return for new or concerning lesions3. Rosacea- Soolantra (ivermectin) cream prescribed : apply twice daily to affected areas- Discussed avoidance of triggers including spicy foods, alcohol, and excessive sun - Stop metrogelRTC: 3 monthsAnastasia Frye 08/05/2021 8:08 PM Scribed for Jeanelle Malling, MD by Kristie Frye, medical scribe June 16, 2023The documentation recorded by the scribe accurately reflects the services  I personally performed and the decisions made by me. I reviewed and confirmed all material entered and/or pre-charted by the scribe.

## 2021-08-08 ENCOUNTER — Encounter: Admit: 2021-08-08 | Payer: PRIVATE HEALTH INSURANCE | Primary: Internal Medicine

## 2021-08-08 ENCOUNTER — Telehealth
Admit: 2021-08-08 | Payer: PRIVATE HEALTH INSURANCE | Attending: Student in an Organized Health Care Education/Training Program | Primary: Internal Medicine

## 2021-08-08 NOTE — Telephone Encounter
PHARMACY NOTE: APPOINTMENTI have talked to Ms. Kristie Frye on 08/08/2021 at 11:38 AM to make an appointment with [facility] Pharmacist. I have scheduled the patient:Appt Type: Phone ConsultPharmacist: Camelia Phenes reason/Notes:Medication name: OtezlaInitial AppointmentPertinent notes from referral: New StartOrder Priority: UrgentDate/Time of Appt: 08/12/2021 at 9:30amDuration of Appt: 60 minutesAdam Khaalid Lefkowitz, CPHTMedication Management Clinic [facility] Main Phone: [phone]

## 2021-08-10 ENCOUNTER — Telehealth: Admit: 2021-08-10 | Payer: PRIVATE HEALTH INSURANCE | Attending: Dermatology | Primary: Internal Medicine

## 2021-08-10 NOTE — Telephone Encounter
Pharmacy sent fax requesting PA for Ivermectin, PA submitted with message as follows:Kristie Frye Key: [id number] without authorization.

## 2021-08-12 ENCOUNTER — Ambulatory Visit: Admit: 2021-08-12 | Payer: BLUE CROSS/BLUE SHIELD | Primary: Internal Medicine

## 2021-08-12 DIAGNOSIS — L409 Psoriasis, unspecified: Secondary | ICD-10-CM

## 2021-08-12 MED ORDER — CHOLECALCIFEROL (VITAMIN D3) 25 MCG (1,000 UNIT) TABLET
25 mcg (1,000 unit) | Freq: Every day | ORAL | Status: AC
Start: 2021-08-12 — End: ?

## 2021-08-12 MED ORDER — APREMILAST 10 MG (4)-20 MG (4)-30 MG (47) TABLETS IN A DOSE PACK
104-204-3047 mg (4)-20 mg (4)-30 mg (47) | ORAL_TABLET | 1 refills | 28.00 days | Status: AC
Start: 2021-08-12 — End: 2021-11-16

## 2021-08-12 MED ORDER — CLOBETASOL 0.05 % SCALP SOLUTION
0.05 % | Freq: Two times a day (BID) | TOPICAL | Status: AC
Start: 2021-08-12 — End: ?

## 2021-08-12 MED ORDER — APREMILAST 30 MG TABLET
30 mg | ORAL_TABLET | Freq: Two times a day (BID) | ORAL | 1 refills | 30.00 days | Status: AC
Start: 2021-08-12 — End: 2021-11-16
  Filled 2021-09-26: qty 55, 28d supply, fill #0
  Filled 2021-09-26: qty 55, 28d supply, fill #1

## 2021-08-12 NOTE — Progress Notes
PHARMACY COMPREHENSIVE MEDICATION MANAGEMENT EVALUATIONPSORIASIS TREATMENT PROGRAMEncounter Date: 6/23/2023Referring/Attending Physician: Dr. Antionette Poles is referred for collaborative medication management of DermatologySubjective: Kristie Frye is a 61 y.o. female who is seen by the clinic pharmacist for a(n) telephone visit.Dermatology Management  The patient has a diagnosis of plaque psoriasis/psoriasis and is referred to the pharmacist for collaborative management. Next dermatology clinician follow-up: 11/18/21.Plan at last dermatology clinician visit (08/05/21):1. Psoriasis, scalp and ear - Discussed treatment options including Otezla vs Zoryve- Begin apremilast starter pack: 10 mg once daily for day 1, 10 mg twice daily (morning and night) on day 2, 10 mg in the morning and 20 mg at night on day 3, 20 mg twice daily (morning and night) on day 4, 20 mg in the morning and 30 mg at night on day 5 and 30 mg twice daily (morning and night) thereafter.Disease state history:?	Anatomical location of disease: scalp and ear?	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	Itching: itching is 5/10 on average on scalp?	Sleep: sleep not impactedDLQI Total Score (maximum 30):: 7 (08/12/2021  9:38 AM)   Interpretation: 6-10; Moderate effect on patient's lifeTreatment regimen history:?	Previous dermatology regimen(s): ?	Topical corticosteroids?	OTC antifungal shampoo?	Current dermatology regimen: ?	Clobetasol 0.05% solution - uses PRN, tries to use sparingly ?	OTC antifungal shampoo (Selsun blue) - uses every time she showers?	Does the patient feel the current treatment regimen is effective? No Medication ManagementMedication-related side effects: none to topical therapiesAdherence:N/A no current systemic regimenMedication storage:The patient reports storing medications appropriately.Affordability:The patient denies concerns with affordability of medications.Preferred/Specialty pharmacy:She has medications filled at [facility] [phone] - [place], [place] - [address] AT [facility] [address]Patient is not currently prescribed any specialty medications.Medication history:A medication history was obtained from the patient.Allergies, problem list, medications, and immunizations were reviewed this visit.Objective: The following labs were reviewed during today's visit: none indicatedAssessment/Plan: Dermatology Medication Therapy Assessment:Kristie Frye feels the same on the current dermatology medication regimen and is taking their regimen as prescribed. Patient has attempted management of scalp/ear psoriasis with topical treatments including topical steroids and topical antifungal shampoos without success. Patient would benefit from a systemic regimen effective for scalp psoriasis, appropriate to start apremilast at this time.  Goals Addressed       This Visit's Progress  ? RxSp Therapeutic Goal   Not started   Psoriasis: Decrease number of lesions, symptom relief (reduction of itching burning and pain)MTPs must be opened for patients not making appropriate progress towards their established therapeutic goals.Patient's progress towards goal: new start  ? RxSp Therapeutic Goal   Not started   Psoriasis: Prevent increased number of flares. Good symptom controlMTPs must be opened for patients not making appropriate progress towards their established therapeutic goals.Patient's progress towards goal: new start  Medication Therapy Recommendations Medication Therapy RecommendationsNo medication therapy recommendations to display Monitoring:Patient is up to date with recommended specialty medication monitoring. Apremilast (Otezla)Monitoring Parameter Recommended Frequency Weight Throughout therapy Signs/symptoms of mood changes, depression, or suicidal thoughts Throughout therapy CMP (assess renal function) as clinically indicated N/A Immunizations:The patient is up to date on immunizations as recommended by the CDC. Recommend the following immunizations for the patient:Patient is up to date on vaccinationsComprehensive Medication ManagementThe patient is taking medications as prescribed. Cost of medications is not a barrier. Patient is not experiencing medication related side effects. The patient is storing medications correctly. Adherence education was provided; recommended: directed education. The  patient does not need refills of medications at this time and refills were not sent.Drug-drug interactions communicated to the referring clinician: noneDose adjustment recommendations communicated to the referring clinician: No adjustments required at this timePlan:- Start apremilast starter pack followed by apremilast 30 mg by mouth twice dailyApremilast starter pack dosingDay 1 10 mg PM Day 2 10 mg BID Day 3 10 mg AM, 20 mg PM Day 4 20 mg BID Day 5 20 mg AM, 30 mg PM Day 6+ 30 mg BID Other pertinent findings: noneMedications ordered: apremilast starter pack and apremilast 30 mg tablet, #60, 0 refillsLabs ordered: nonePharmacist Follow-Up: TBD - 1 mo after medication startPatient Education:  The following education was provided prior to initial med dispense: Initiation, modification, or discontinuation of drug therapy, Assessment of efficacy and safety of drug therapy and Adverse effect presentation, evaluation, and monitoring during today?s visit to the patient regarding medications and plaque psoriasis/psoriasis management: proper administration of medications, purpose of each medication, side effects of medications and importance of adherence with the regimen(s)Time Spent: 60 minutes, telephone consultPATIENT AND/OR CAREGIVER VERBALIZED UNDERSTANDING OF CARE PLAN AND PATIENT GOALS: yesPATIENT ADVISED TO CALL BACK WITH QUESTIONS, CONCERNS, OR CHANGE IN SYMPTOMS.Miguel Dibble, PharmD, BCPPAmbulatory Clinical Pharmacist II, DermatologyMedication Management [phone]

## 2021-08-16 ENCOUNTER — Encounter: Admit: 2021-08-16 | Payer: PRIVATE HEALTH INSURANCE | Attending: Internal Medicine | Primary: Internal Medicine

## 2021-08-16 MED ORDER — METOPROLOL SUCCINATE ER 25 MG TABLET,EXTENDED RELEASE 24 HR
25 | ORAL_TABLET | ORAL | 5 refills | 90.00000 days | Status: AC
Start: 2021-08-16 — End: 2022-09-05

## 2021-09-19 ENCOUNTER — Encounter: Admit: 2021-09-19 | Payer: PRIVATE HEALTH INSURANCE | Attending: Internal Medicine | Primary: Internal Medicine

## 2021-09-19 MED ORDER — DEXMETHYLPHENIDATE 10 MG TABLET
10 | ORAL_TABLET | ORAL | 1 refills | 30.00000 days | Status: AC
Start: 2021-09-19 — End: 2022-01-27

## 2021-09-23 ENCOUNTER — Encounter: Admit: 2021-09-23 | Payer: PRIVATE HEALTH INSURANCE | Primary: Internal Medicine

## 2021-09-23 ENCOUNTER — Telehealth
Admit: 2021-09-23 | Payer: PRIVATE HEALTH INSURANCE | Attending: Student in an Organized Health Care Education/Training Program | Primary: Internal Medicine

## 2021-09-23 NOTE — Telephone Encounter
I have left a voicemail for Ms. Kristie Frye on 09/23/2021 at 2:52 PM. [facility] Technician left a voice message and sent a MyChart message to patient informing her that per [facility] Pharmacist, she can call [facility] and arrange for delivery of her [place] if she still wishes to start taking it.Hebert Soho, CPHTMedication Management Clinic [facility] Main Phone: [phone]

## 2021-09-27 ENCOUNTER — Encounter: Admit: 2021-09-27 | Payer: PRIVATE HEALTH INSURANCE | Primary: Internal Medicine

## 2021-09-28 ENCOUNTER — Telehealth: Admit: 2021-09-28 | Payer: PRIVATE HEALTH INSURANCE | Primary: Internal Medicine

## 2021-09-28 NOTE — Telephone Encounter
PHARMACY NOTE: SCHEDULING MISSED CALL 	I have left a voicemail for Ms. Maryagnes Amos on 09/28/2021 at 8:50 AM  to schedule a Follow-up appointment with [facility] pharmacist. This is the first call attempt.When patient returns telephone call, please schedule as follows: Appt Type: Phone Consult or MyChart Video visitPharmacist: Camelia Phenes reason/Notes:Medication name: OtezlaFollow-up AppointmentPertinent notes from referral: Schedule follow up for around week of 9/11/23Order Priority: Routine Duration of Appt: 30 minutesRyan Dineen Conradt, CPHTMedication Management Clinic [facility] Main Phone: [phone]

## 2021-10-05 NOTE — Telephone Encounter
PHARMACY NOTE: SCHEDULING MISSED CALL 	I have left a voicemail for Ms. Kristie Frye on 10/05/2021 at 9:29 AM  to schedule a Follow-up appointment with [facility] pharmacist. This is the second call attempt.When patient returns telephone call, please schedule as follows: Appt Type: Phone Consult or MyChart Video visitPharmacist: Camelia Phenes reason/Notes:Medication name: OtezlaFollow-up AppointmentPertinent notes from referral: Schedule follow up for around week of 9/11/23Order Priority: Routine Duration of Appt: 30 minutesRyan Michaeline Eckersley, CPHTMedication Management Clinic [facility] Main Phone: [phone]

## 2021-10-10 ENCOUNTER — Encounter: Admit: 2021-10-10 | Payer: PRIVATE HEALTH INSURANCE | Primary: Internal Medicine

## 2021-10-10 NOTE — Telephone Encounter
PHARMACY NOTE: SCHEDULING MISSED CALL 	I have left a voicemail for Ms. Kristie Frye on 10/10/2021 at 3:58 PM  to schedule a Follow-up appointment with [facility] pharmacist. This is the third call attempt.When patient returns telephone call, please schedule as follows: Appt Type: Phone Consult or MyChart Video visitPharmacist: Camelia Phenes reason/Notes:Medication name: OtezlaFollow-up AppointmentPertinent notes from referral: Schedule follow up in approximately 5 weeksOrder Priority: Routine Duration of Appt: 30 minutesAdam Kizer Nobbe, CPHTMedication Management Clinic [facility] Main Phone: [phone]

## 2021-10-10 NOTE — Telephone Encounter
PHARMACY NOTE: APPOINTMENTI have talked to Ms. Kristie Frye on 10/10/2021 at 4:30 PM to make an appointment with [facility] Pharmacist. I have scheduled the patient:Appt Type: Phone ConsultPharmacist: Camelia Phenes reason/Notes:Medication name: OtezlaFollow-up AppointmentPertinent notes from referral: Order Priority: RoutineDate/Time of Appt: 11/25/2021 at 11:00amDuration of Appt: 30 minutesAdam Mosie Angus, CPHTMedication Management Clinic [facility] Main Phone: [phone]

## 2021-10-18 ENCOUNTER — Telehealth: Admit: 2021-10-18 | Payer: PRIVATE HEALTH INSURANCE | Primary: Internal Medicine

## 2021-10-18 NOTE — Telephone Encounter
Attempted to reach patient regarding hand-off from OPS that patient is experiencing ADE with [place] (GI upset and headache) and has stopped taking the medication. Provided [facility] callback number.Patient has recently started [facility] and has not had follow up with [facility] or [facility] yet. Per OPS patient plans to cancel upcoming visits with [facility] and Dr. Noel Gerold. Will inform Dr. Noel Gerold and attempt to reach patient again if no call back is returned.Miguel Dibble, PharmD, BCPPAmbulatory Clinical Pharmacist II, DermatologyMedication Management [phone]/29/23

## 2021-11-01 ENCOUNTER — Encounter: Admit: 2021-11-01 | Payer: PRIVATE HEALTH INSURANCE | Attending: Internal Medicine | Primary: Internal Medicine

## 2021-11-01 MED ORDER — DEXMETHYLPHENIDATE ER 20 MG CAPSULE,EXTENDED RELEASE BIPHASIC50-50
20 | ORAL_CAPSULE | ORAL | 1 refills | 30.00000 days | Status: AC
Start: 2021-11-01 — End: 2021-12-05

## 2021-11-11 ENCOUNTER — Encounter: Admit: 2021-11-11 | Payer: PRIVATE HEALTH INSURANCE | Primary: Internal Medicine

## 2021-11-16 ENCOUNTER — Telehealth: Admit: 2021-11-16 | Payer: PRIVATE HEALTH INSURANCE | Primary: Internal Medicine

## 2021-11-16 NOTE — Telephone Encounter
Reached patient today after receiving message from OPS that patient reports she will not be continuing Otezla in setting of ADE. Patient reports she had ongoing diarrhea and worsening of mood symptoms with start of Otezla. Stopped taking medication 2-3 weeks ago and side effects have resolved. Skin is variable day to day since stopping medication. Is interested in pursuing further topical options but does not seem interested in systemic treatments any longer. Would be okay with [facility] discussing with Dr. Noel Gerold alternative topical options and sending in prescription vs waiting for new office visit, but is okay with waiting until office appointment if needed. Miguel Dibble, PharmD, BCPPAmbulatory Clinical Pharmacist II, DermatologyMedication Management [phone]/27/23

## 2021-11-17 ENCOUNTER — Telehealth: Admit: 2021-11-17 | Payer: PRIVATE HEALTH INSURANCE | Primary: Internal Medicine

## 2021-11-17 NOTE — Telephone Encounter
Attempted to reach patient to review starting Vtama 1% cream in place of Otezla per recommendation by Dr. Noel Gerold. Unable to reach patient today. LVM with [facility] callback number and hours.Miguel Dibble, PharmD, BCPPAmbulatory Clinical Pharmacist II, DermatologyMedication Management [phone]/28/23

## 2021-11-17 NOTE — Telephone Encounter
Kristie Frye would be a good option thanks

## 2021-11-18 ENCOUNTER — Encounter: Admit: 2021-11-18 | Payer: BLUE CROSS/BLUE SHIELD | Attending: Dermatology | Primary: Internal Medicine

## 2021-11-18 ENCOUNTER — Encounter: Admit: 2021-11-18 | Payer: PRIVATE HEALTH INSURANCE | Attending: Internal Medicine | Primary: Internal Medicine

## 2021-11-21 ENCOUNTER — Encounter: Admit: 2021-11-21 | Payer: PRIVATE HEALTH INSURANCE | Attending: Internal Medicine | Primary: Internal Medicine

## 2021-11-21 MED ORDER — ATORVASTATIN 80 MG TABLET
80 | ORAL_TABLET | Freq: Every day | ORAL | 4 refills | 90.00000 days | Status: AC
Start: 2021-11-21 — End: 2022-01-06

## 2021-11-24 ENCOUNTER — Telehealth: Admit: 2021-11-24 | Payer: PRIVATE HEALTH INSURANCE | Primary: Internal Medicine

## 2021-11-24 DIAGNOSIS — L409 Psoriasis, unspecified: Secondary | ICD-10-CM

## 2021-11-24 MED ORDER — VTAMA 1 % TOPICAL CREAM
1 % | Freq: Every day | TOPICAL | 1 refills | Status: AC
Start: 2021-11-24 — End: ?

## 2021-11-24 NOTE — Telephone Encounter
Able to reach patient today, reviewed starting Vtama 1% cream for psoriasis given patient would prefer topical formulations. Discussed dosing and adverse effects. Advised patient to call [facility] with any questions or concerns. [facility] pharmacy. Miguel Dibble, PharmD, BCPPAmbulatory Clinical Pharmacist II, DermatologyMedication Management [phone]/05/23

## 2021-11-25 ENCOUNTER — Ambulatory Visit: Admit: 2021-11-25 | Payer: BLUE CROSS/BLUE SHIELD | Primary: Internal Medicine

## 2021-11-28 ENCOUNTER — Telehealth: Admit: 2021-11-28 | Payer: PRIVATE HEALTH INSURANCE | Primary: Internal Medicine

## 2021-11-28 NOTE — Telephone Encounter
PHARMACY NOTE: SCHEDULING MISSED CALL 	I have left a voicemail for Ms. Maryagnes Amos on 11/28/2021 at 10:36 AM  to schedule a Follow-up appointment with [facility] pharmacist. This is the first call attempt.When patient returns telephone call, please schedule as follows: Appt Type: Phone Consult or MyChart Video visitPharmacist: Camelia Phenes reason/Notes:Medication name: OtezlaFollow-up AppointmentPertinent notes from referral: Order Priority: Routine Duration of Appt: 30 minutesPauline Jaryiah Mehlman, CPHTMedication Management Clinic [facility] Main Phone: [phone]

## 2021-12-02 ENCOUNTER — Encounter: Admit: 2021-12-02 | Payer: PRIVATE HEALTH INSURANCE | Attending: Obstetrics and Gynecology | Primary: Internal Medicine

## 2021-12-05 ENCOUNTER — Encounter: Admit: 2021-12-05 | Payer: PRIVATE HEALTH INSURANCE | Attending: Internal Medicine | Primary: Internal Medicine

## 2021-12-05 MED ORDER — DEXMETHYLPHENIDATE ER 20 MG CAPSULE,EXTENDED RELEASE BIPHASIC50-50
20 | ORAL_CAPSULE | ORAL | 1 refills | 30.00000 days | Status: AC
Start: 2021-12-05 — End: 2021-12-28

## 2021-12-12 ENCOUNTER — Encounter: Payer: Self-pay | Admitting: Physical Therapy

## 2021-12-12 ENCOUNTER — Ambulatory Visit: Payer: Managed Care, Other (non HMO) | Attending: Physician Assistant | Admitting: Physical Therapy

## 2021-12-12 DIAGNOSIS — M25512 Pain in left shoulder: Secondary | ICD-10-CM | POA: Insufficient documentation

## 2021-12-12 DIAGNOSIS — M25612 Stiffness of left shoulder, not elsewhere classified: Secondary | ICD-10-CM | POA: Diagnosis present

## 2021-12-12 DIAGNOSIS — M6281 Muscle weakness (generalized): Secondary | ICD-10-CM | POA: Insufficient documentation

## 2021-12-12 NOTE — Therapy (Unsigned)
OUTPATIENT PHYSICAL THERAPY SHOULDER POST-OP EVALUATION   Patient Name: Ariana Jefferson MRN: [id number] DOB:28-Mar-1960, 61 y.o., female Today's Date: 12/13/2021   PT End of Session - 12/12/21 1804     Visit Number 1    Number of Visits 17    Date for PT Re-Evaluation 02/10/22    Authorization Type [employer] 2023, 60 combined PT/OT/Chiro    Progress Note Due on Visit 10    PT Start Time 1718    PT Stop Time 1800    PT Time Calculation (min) 42 min    Equipment Utilized During Treatment --   BREG shoulder sling with abduction pillow, L shoulder   Activity Tolerance Patient tolerated treatment well;Patient limited by pain    Behavior During Therapy WFL for tasks assessed/performed             Past Medical History:  Diagnosis Date   High cholesterol    Past Surgical History:  Procedure Laterality Date   CESAREAN SECTION     CHOLECYSTECTOMY     KNEE SURGERY     LAPAROSCOPIC GASTRIC SLEEVE RESECTION     Patient Active Problem List   Diagnosis Date Noted   Hx of skin cancer, basal cell 02/07/2018   Obesity 02/07/2018   High cholesterol 12/11/2016   Low vitamin B12 level 12/11/2016   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   Lung nodule seen on imaging study 11/13/2014   DJD (degenerative joint disease) 05/14/2013    PCP: Leim Fabry, MD  REFERRING PROVIDER: Gunnar Fusi, PA  REFERRING DIAGNOSIS:  [phone] (ICD-10-CM) - S/P arthroscopy of left shoulder  Z01.818 (ICD-10-CM) - Encounter for other preprocedural examination    THERAPY DIAG: Acute pain of left shoulder  Stiffness of left shoulder, not elsewhere classified  Muscle weakness (generalized)  RATIONALE FOR EVALUATION AND TREATMENT: Rehabilitation  ONSET DATE: 11/24/21   FOLLOW UP APPT WITH PROVIDER: Yes , f/u with referring office 01/02/22    SUBJECTIVE:                                                                                                                                                                                          Chief Complaint: Pt is a 61 year old female s/p L shoulder arthroscopy with biceps tenodesis, resection of adhesions, SAD, DCE, debridement 11/24/21   Pertinent History Pt is a 61 year old female s/p L shoulder arthroscopy with biceps tenodesis, resection of adhesions, SAD, DCE, debridement. Patient reports no post-op complications. Pt reports post-op pain is better than she anticipated; pt reports mild pain presently. Patient reports no instructions yet on exercises to do with shoulder. Pt has weaned from opioid analgesic pain medicine;  pt alternates Tylenol and Ibuprofen as needed. Pt denies recent physical trauma. Pt had emergency hemicholectomy earlier this year with good post-op recovery. Pt has been compliant with sling use as directed by her surgeon.   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, 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 (personal history of cancer, chills/fever, night sweats, nausea, vomiting, unrelenting pain):   -hx of basal cell carcinoma, lung nodule on imaging study,  otherwise negative  Precautions: No active biceps x 6 weeks, no resisted elbow flexion or supination, A/AAROM at 4 weeks post-op   Living Environment Lives with: lives with their spouse Lives in: House/apartment   Patient Goals: Patient wants to return to golf, being able to remodel her home/paint, able  to reach above head    OBJECTIVE:   Patient Surveys  FOTO: 29, predicted score of 60  Cognition Patient is oriented to person, place, and time.  Recent memory is intact.  Remote memory is intact.  Attention span and concentration are intact.  Expressive speech is intact.  Patient's fund of knowledge is within normal limits for educational level.    Gross Musculoskeletal Assessment Tremor: None Bulk: Normal Tone: Normal   Posture Mild forward head, protracted scapulae   Cervical Screen  AROM AROM (Normal range in degrees) AROM 12/13/2021   Right Left  Shoulder    Flexion  Deferred  Extension  Deferred  Abduction  Deferred  External Rotation  Deferred  Internal Rotation  Deferred  Hands Behind Head  Deferred  Hands Behind Back  Deferred      Elbow    Flexion    Extension    Pronation    Supination    (* = pain; Blank rows = not tested)   PROM PROM (Normal range in degrees) PROM 12/13/2021   Right Left  Shoulder    Flexion  50  Extension    Abduction    External Rotation  15  Internal Rotation  45  Hands Behind Head    Hands Behind Back        Elbow    Flexion  WNL  Extension  -10  Pronation  WNL  Supination  WNL  (* = pain; Blank rows = not tested)   LE MMT: MMT deferred due to early post-op status* MMT (out of 5) Right 12/13/2021 Left 12/13/2021      Shoulder   Flexion    Extension    Abduction    External rotation    Internal rotation    Horizontal abduction    Horizontal adduction    Lower Trapezius    Rhomboids        Elbow  Flexion    Extension    Pronation    Supination        Wrist  Flexion    Extension    Radial deviation    Ulnar deviation        (* = pain; Blank rows = not tested)  Sensation Deferred  Reflexes Deferred   Palpation  Location LEFT  RIGHT           Subocciptials    Cervical paraspinals    Upper Trapezius 0   Levator Scapulae    Rhomboid Major/Minor    Sternoclavicular joint 0    Acromioclavicular joint 2   Coracoid process 2   Bicipital groove 2   Supraspinatus 1   Infraspinatus 0   Subscapularis    Teres Minor    Teres Major    Pectoralis Major 1   Pectoralis Minor    Anterior Deltoid 1   Lateral Deltoid 1   Posterior Deltoid 0   Latissimus Dorsi    Sternocleidomastoid    (Blank rows = not tested) Graded on 0-4 scale (0 = no pain, 1 = pain, 2 = pain with wincing/grimacing/flinching, 3 = pain with withdrawal, 4 = unwilling to allow palpation), (Blank rows = not tested)     TODAY'S TREATMENT     Manual Therapy - for R shoulder ROM and to prevent R shoulder stiffness   R shoulder PROM into flexion and ER to neutral, ROM within pt tolerance, gentle oscillations of upper limb during ROM; x 8 minutes     Therapeutic Exercise - for HEP establishment, discussion on appropriate exercise/activity modification, PT education   Reviewed baseline home exercises and provided handout for [facility] program (see Access Code); tactile cueing and therapist demonstration utilized as needed for carryover of proper technique to HEP.    Patient education on current condition, anatomy involved, role of PT, prognosis, plan of care. Discussion on current post-operative restrictions, primarily for protection of biceps tenodesis.     PATIENT EDUCATION:  Education details: Plan of care, HEP Person educated: Patient Education method: Explanation, Handout, Return Demonstration Education comprehension: verbalized understanding   HOME EXERCISE PROGRAM: Access Code: [email or web address] URL: [email or web address] Date: 12/14/2021 Prepared by: Valentina Gu  Exercises - Flexion-Extension Shoulder Pendulum with Table Support  - 2 x daily - 7 x weekly - 2 sets - 10 reps - Horizontal Shoulder Pendulum with Table Support  - 2 x daily - 7 x weekly - 2 sets - 10 reps - Seated Scapular Retraction  - 2 x daily - 7 x weekly - 2 sets - 10 reps - 3sec  hold   ASSESSMENT:  CLINICAL IMPRESSION: Patient is a 61 y.o. female s/p L shoulder arthroscopy with biceps tenodesis, resection of adhesions, SAD, DCE, debridement. Patient has expected early post-operative impairments including significant L shoulder ROM deficits, local nociceptive L shoulder pain, edema, decreased L shoulder/elbow strength. Objective impairments include decreased ROM, decreased strength, hypomobility, increased edema, impaired UE functional use, and pain. These impairments are limiting patient from  golfing, remodeling home, lifting/carrying activities, driving . Personal factors including Past/current experiences and 1 comorbidity (high cholesterol)  are also affecting patient's functional outcome. Patient will benefit from skilled PT to address above impairments and improve overall function.  REHAB POTENTIAL: Excellent  CLINICAL DECISION MAKING: Evolving/moderate complexity  EVALUATION COMPLEXITY: Low   GOALS: Goals reviewed with patient? Yes  SHORT TERM GOALS: Target date: 01/10/2022  Pt will be independent with HEP to improve strength and decrease neck pain to improve pain-free function at home and work. Baseline: 12/12/21: Baseline HEP initiated Goal status: INITIAL   LONG TERM GOALS: Target date: 02/07/2022  Pt will increase FOTO to at least 60 to demonstrate significant improvement in function at home and work related to neck pain  Baseline: 12/12/21: 29 Goal status: INITIAL  2.   Pt will have L shoulder  AROM at least within 10 degrees of contralateral upper extremity indicative of improved ROM as needed for reaching, overhead work, self-care activities/dressing/grooming Baseline: 12/12/21: NO AROM presently, severely limited PROM in early post-op phase.    Goal status: INITIAL  3. Pt will improve L shoulder strength to at least 4+/5 or greater for all motions measured as needed for improved capacity to perform lifting and stabilize shoulder during  working with power tools to complete home improvement projects and swing golf club       Baseline: 12/12/21: No MMTs due to early post-op status Goal status: INITIAL  4. Pt will demonstrate golf swing with sound technique and no reproduction of shoulder pain as needed for return to golfing Baseline: 12/12/21: Unable to complete shoulder AROM or compound motion required for golf swing Goal status: INITIAL   PLAN: PT FREQUENCY: 2x/week  PT DURATION: 8 weeks  PLANNED INTERVENTIONS: Therapeutic exercises, Therapeutic activity, Neuromuscular re-education, Patient/Family education, Joint mobilization, Dry Needling, Electrical stimulation, Cryotherapy, Moist heat, Traction, and Manual therapy  PLAN FOR NEXT SESSION: Shoulder ROM, no A/AAROM until 4 weeks post-op, wrist/hand AROM, periscapular isometrics, cryotherapy for post-operative shoulder prn   Consuela Mimes, PT, DPT #[id number]  Gertie Exon 12/13/2021, 12:49 PM

## 2021-12-14 ENCOUNTER — Ambulatory Visit: Payer: Managed Care, Other (non HMO) | Admitting: Physical Therapy

## 2021-12-14 ENCOUNTER — Encounter: Payer: Self-pay | Admitting: Physical Therapy

## 2021-12-14 DIAGNOSIS — M25512 Pain in left shoulder: Secondary | ICD-10-CM | POA: Diagnosis not present

## 2021-12-14 DIAGNOSIS — M25612 Stiffness of left shoulder, not elsewhere classified: Secondary | ICD-10-CM

## 2021-12-14 DIAGNOSIS — M6281 Muscle weakness (generalized): Secondary | ICD-10-CM

## 2021-12-14 NOTE — Therapy (Signed)
OUTPATIENT PHYSICAL THERAPY TREATMENT NOTE   Patient Name: Ariana Jefferson MRN: [id number] DOB:Apr 15, 1960, 61 y.o., female Today's Date: 12/14/2021  PCP: Gayland Curry, MD REFERRING PROVIDER: Ann Lions, PA  END OF SESSION:   PT End of Session - 12/14/21 1703     Visit Number 2    Number of Visits 17    Date for PT Re-Evaluation 02/10/22    Authorization Type [employer] 2023, 60 combined PT/OT/Chiro    Progress Note Due on Visit 10    PT Start Time 1709    PT Stop Time 1752    PT Time Calculation (min) 43 min    Equipment Utilized During Treatment --   BREG shoulder sling with abduction pillow, L shoulder   Activity Tolerance Patient tolerated treatment well;Patient limited by pain    Behavior During Therapy WFL for tasks assessed/performed             Past Medical History:  Diagnosis Date   High cholesterol    Past Surgical History:  Procedure Laterality Date   [place] GASTRIC SLEEVE RESECTION     Patient Active Problem List   Diagnosis Date Noted   Hx of skin cancer, basal cell 02/07/2018   Obesity 02/07/2018   High cholesterol 12/11/2016   Low vitamin B12 level 12/11/2016   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   Lung nodule seen on imaging study 11/13/2014   DJD (degenerative joint disease) 05/14/2013    REFERRING DIAG:  J24.268 (ICD-10-CM) - S/P arthroscopy of left shoulder  Z01.818 (ICD-10-CM) - Encounter for other preprocedural examination    THERAPY DIAG:  Acute pain of left shoulder  Stiffness of left shoulder, not elsewhere classified  Muscle weakness (generalized)  Rationale for Evaluation and Treatment Rehabilitation  PERTINENT HISTORY: Pt is a 61 year old female s/p L shoulder arthroscopy with biceps tenodesis, resection of adhesions, SAD, DCE, debridement. Patient reports no post-op complications. Pt reports post-op pain is better than she anticipated; pt reports mild  pain presently. Patient reports no instructions yet on exercises to do with shoulder. Pt has weaned from opioid analgesic pain medicine; pt alternates Tylenol and Ibuprofen as needed. Pt denies recent physical trauma. Pt had emergency hemicholectomy earlier this year with good post-op recovery. Pt has been compliant with sling use as directed by her surgeon.    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, 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  [employer] flags (personal history of cancer, chills/fever, night sweats, nausea, vomiting, unrelenting pain):              -hx of basal cell carcinoma, lung nodule on imaging study,  otherwise negative    Living Environment Lives with: lives with their spouse Lives in: House/apartment     Patient Goals: Patient wants to return to golf, being able to remodel her home/paint, able to reach above head     PRECAUTIONS: No active biceps x 6 weeks, no resisted elbow flexion or supination, A/AAROM  at 4 weeks post-op   DOS: 11/24/21   SUBJECTIVE:                                                                                                                                                                                      SUBJECTIVE STATEMENT:  Patient reports ongoing compliance with sling use and activity modification/post-operative restrictions. She reports compliance with her HEP. She reports more difficulty  with side-to-side pendulums. Pt reports tolerating initial evaluation better than expected.    PAIN:  Are you having pain? Yes: NPRS scale: 2-3/10    OBJECTIVE: (objective measures completed at initial evaluation unless otherwise dated)     Patient Surveys  FOTO: 47, predicted score of 60   Cognition Patient is oriented to person, place, and time.  Recent memory is intact.  Remote memory is intact.  Attention span and concentration are intact.  Expressive speech is intact.  Patient's fund of knowledge is within normal limits for educational level.                          Gross Musculoskeletal Assessment Tremor: None Bulk: Normal Tone: Normal     Posture Mild forward head, protracted scapulae     Cervical Screen   AROM     AROM (Normal range in degrees) AROM 12/13/2021    Right Left  Shoulder      Flexion   Deferred  Extension   Deferred  Abduction   Deferred  External Rotation   Deferred  Internal Rotation   Deferred  Hands Behind Head   Deferred  Hands Behind Back   Deferred         Elbow      Flexion      Extension      Pronation      Supination      (* = pain; Blank rows = not tested)     PROM     PROM (Normal range in degrees) PROM 12/13/2021    Right Left  Shoulder      Flexion   50  Extension      Abduction      External Rotation   15  Internal Rotation   45  Hands Behind Head      Hands Behind Back             Elbow      Flexion   WNL  Extension   -10  Pronation   WNL  Supination   WNL  (* = pain; Blank rows = not tested)     LE MMT: MMT deferred due to early post-op  status* MMT (out of 5) Right 12/13/2021 Left 12/13/2021         Shoulder   Flexion      Extension      Abduction      External rotation      Internal rotation      Horizontal abduction      Horizontal adduction      Lower Trapezius      Rhomboids             Elbow  Flexion      Extension      Pronation      Supination             Wrist  Flexion       Extension      Radial deviation      Ulnar deviation             (* = pain; Blank rows = not tested)   Sensation Deferred   Reflexes Deferred     Palpation   Location LEFT  RIGHT           Subocciptials      Cervical paraspinals      Upper Trapezius 0    Levator Scapulae      Rhomboid Major/Minor      Sternoclavicular joint 0    Acromioclavicular joint 2    Coracoid process 2    Bicipital groove 2    Supraspinatus 1    Infraspinatus 0    Subscapularis      Teres Minor      Teres Major      Pectoralis Major 1    Pectoralis Minor      Anterior Deltoid 1    Lateral Deltoid 1    Posterior Deltoid 0    Latissimus Dorsi      Sternocleidomastoid      (Blank rows = not tested) Graded on 0-4 scale (0 = no pain, 1 = pain, 2 = pain with wincing/grimacing/flinching, 3 = pain with withdrawal, 4 = unwilling to allow palpation), (Blank rows = not tested)         TODAY'S TREATMENT      Manual Therapy - for R shoulder ROM and to prevent R shoulder stiffness   R shoulder PROM into flexion and ER to neutral, ROM within pt tolerance, gentle oscillations of upper limb during ROM; x 15 minutes R elbow PROM for flexion/extension and pronation/supination; x 5 minutes Gentle STM to R biceps, anterior and middle deltoid; x 3 minutes     Therapeutic Exercise - for HEP establishment, discussion on appropriate exercise/activity modification, PT education   Pendulums, forward/backward, side-to-side circles CW and CCW; x20 each with well arm propped on edge of treatment table Scapular retraction; reviewed Wrist AROM, flexion/extension and RD/UD; reviewed Gripping; yellow putty; 2 minutes   Assisted patient with doffing and donning her post-operative sling in standing position.    PATIENT EDUCATION: Reviewed current post-operative restrictions, techniques for home exercises   Cold pack (unbilled) - for anti-inflammatory and analgesic effect as needed for reduced pain and  improved ability to participate in active PT intervention, along L shoulder in sitting, x 5 minutes      PATIENT EDUCATION:  Education details: Plan of care, HEP Person educated: Patient Education method: Explanation, Handout, Return Demonstration Education comprehension: verbalized understanding     HOME EXERCISE PROGRAM: Access Code: [email or web address] URL: [email or web address] Date: 12/14/2021 Prepared by: Consuela Mimes   Exercises - Flexion-Extension Shoulder Pendulum with  Table Support  - 2 x daily - 7 x weekly - 2 sets - 10 reps - Horizontal Shoulder Pendulum with Table Support  - 2 x daily - 7 x weekly - 2 sets - 10 reps - Seated Scapular Retraction  - 2 x daily - 7 x weekly - 2 sets - 10 reps - 3sec hold     ASSESSMENT:   CLINICAL IMPRESSION:  Patient arrives with excellent motivation to participate in physical therapy. Pt is able to markedly improve degree of forward flexion tolerated this evening with PROM of forward flexion up to 120 deg. Limiting ER at this time and allowing no shoulder hyperextension to protect biceps tenodesis. Reviewed technique for pendulums to ensure minimal active motion during this exercise at home. Patient is nearing end of protective phase of rehab and will be able to initiate gentle A/AAROM after next week. Patient has remaining deficits including: significant L shoulder ROM deficits, local nociceptive L shoulder pain, edema, decreased L shoulder/elbow strength. Patient will benefit from continued skilled therapeutic intervention to address the above deficits as needed for improved function and QoL.     REHAB POTENTIAL: Excellent   CLINICAL DECISION MAKING: Evolving/moderate complexity   EVALUATION COMPLEXITY: Low     GOALS: Goals reviewed with patient? Yes   SHORT TERM GOALS: Target date: 01/10/2022   Pt will be independent with HEP to improve strength and decrease neck pain to improve pain-free function at home and  work. Baseline: 12/12/21: Baseline HEP initiated Goal status: INITIAL     LONG TERM GOALS: Target date: 02/07/2022   Pt will increase FOTO to at least 60 to demonstrate significant improvement in function at home and work related to neck pain  Baseline: 12/12/21: 29 Goal status: INITIAL   2.   Pt will have L shoulder AROM at least within 10 degrees of contralateral upper extremity indicative of improved ROM as needed for reaching, overhead work, self-care activities/dressing/grooming Baseline: 12/12/21: NO AROM presently, severely limited PROM in early post-op phase.    Goal status: INITIAL   3. Pt will improve L shoulder strength to at least 4+/5 or greater for all motions measured as needed for improved capacity to perform lifting and stabilize shoulder during working with power tools to complete home improvement projects and swing golf club       Baseline: 12/12/21: No MMTs due to early post-op status Goal status: INITIAL   4. Pt will demonstrate golf swing with sound technique and no reproduction of shoulder pain as needed for return to golfing Baseline: 12/12/21: Unable to complete shoulder AROM or compound motion required for golf swing Goal status: INITIAL     PLAN: PT FREQUENCY: 2x/week   PT DURATION: 8 weeks   PLANNED INTERVENTIONS: Therapeutic exercises, Therapeutic activity, Neuromuscular re-education, Patient/Family education, Joint mobilization, Dry Needling, Electrical stimulation, Cryotherapy, Moist heat, Traction, and Manual therapy   PLAN FOR NEXT SESSION: Shoulder ROM, no A/AAROM until 4 weeks post-op, wrist/hand AROM, periscapular isometrics, cryotherapy for post-operative shoulder prn    Consuela Mimes, PT, DPT #[id number]  Gertie Exon, PT 12/14/2021, 5:09 PM

## 2021-12-19 ENCOUNTER — Telehealth
Admit: 2021-12-19 | Payer: PRIVATE HEALTH INSURANCE | Attending: Student in an Organized Health Care Education/Training Program | Primary: Internal Medicine

## 2021-12-19 ENCOUNTER — Ambulatory Visit: Payer: Managed Care, Other (non HMO) | Admitting: Physical Therapy

## 2021-12-19 NOTE — Telephone Encounter
I spoke to [facility] regarding appeal for [employer]. Lajoyce Corners stated that she didn't see appeal letter on file. Ivy provided correct fax number for [employer] and [facility] department. I faxed appeal letter over successfully.Hebert Soho, CPHTMedication Management Clinic [facility] [address], [place] [id number]: [phone]: [phone]

## 2021-12-21 ENCOUNTER — Ambulatory Visit: Payer: Managed Care, Other (non HMO) | Attending: Physician Assistant | Admitting: Physical Therapy

## 2021-12-21 ENCOUNTER — Encounter: Payer: Self-pay | Admitting: Physical Therapy

## 2021-12-21 DIAGNOSIS — M6281 Muscle weakness (generalized): Secondary | ICD-10-CM | POA: Insufficient documentation

## 2021-12-21 DIAGNOSIS — M25512 Pain in left shoulder: Secondary | ICD-10-CM | POA: Insufficient documentation

## 2021-12-21 DIAGNOSIS — M25612 Stiffness of left shoulder, not elsewhere classified: Secondary | ICD-10-CM | POA: Diagnosis present

## 2021-12-21 NOTE — Therapy (Signed)
OUTPATIENT PHYSICAL THERAPY TREATMENT NOTE   Patient Name: Ariana Jefferson MRN: [id number] DOB:11-27-1960, 61 y.o., female Today's Date: 12/21/2021  PCP: Gayland Curry, MD REFERRING PROVIDER: Ann Lions, PA  END OF SESSION:   PT End of Session - 12/21/21 1727     Visit Number 3    Number of Visits 17    Date for PT Re-Evaluation 02/10/22    Authorization Type [employer] 2023, 60 combined PT/OT/Chiro    Progress Note Due on Visit 10    PT Start Time 1716    PT Stop Time 1759    PT Time Calculation (min) 43 min    Equipment Utilized During Treatment --   BREG shoulder sling with abduction pillow, L shoulder   Activity Tolerance Patient tolerated treatment well;Patient limited by pain    Behavior During Therapy WFL for tasks assessed/performed             Past Medical History:  Diagnosis Date   High cholesterol    Past Surgical History:  Procedure Laterality Date   [place] GASTRIC SLEEVE RESECTION     Patient Active Problem List   Diagnosis Date Noted   Hx of skin cancer, basal cell 02/07/2018   Obesity 02/07/2018   High cholesterol 12/11/2016   Low vitamin B12 level 12/11/2016   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   Lung nodule seen on imaging study 11/13/2014   DJD (degenerative joint disease) 05/14/2013    REFERRING DIAG:  X52.841 (ICD-10-CM) - S/P arthroscopy of left shoulder  Z01.818 (ICD-10-CM) - Encounter for other preprocedural examination    THERAPY DIAG:  Acute pain of left shoulder  Stiffness of left shoulder, not elsewhere classified  Muscle weakness (generalized)  Rationale for Evaluation and Treatment Rehabilitation  PERTINENT HISTORY: Pt is a 61 year old female s/p L shoulder arthroscopy with biceps tenodesis, resection of adhesions, SAD, DCE, debridement. Patient reports no post-op complications. Pt reports post-op pain is better than she anticipated; pt reports mild  pain presently. Patient reports no instructions yet on exercises to do with shoulder. Pt has weaned from opioid analgesic pain medicine; pt alternates Tylenol and Ibuprofen as needed. Pt denies recent physical trauma. Pt had emergency hemicholectomy earlier this year with good post-op recovery. Pt has been compliant with sling use as directed by her surgeon.    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, 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  [employer] flags (personal history of cancer, chills/fever, night sweats, nausea, vomiting, unrelenting pain):              -hx of basal cell carcinoma, lung nodule on imaging study,  otherwise negative    Living Environment Lives with: lives with their spouse Lives in: House/apartment     Patient Goals: Patient wants to return to golf, being able to remodel her home/paint, able to reach above head     PRECAUTIONS: No active biceps x 6 weeks, no resisted elbow flexion or supination, A/AAROM  at 4 weeks post-op   DOS: 11/24/21   SUBJECTIVE:                                                                                                                                                                                      SUBJECTIVE STATEMENT:  Patient voices concern this evening with edema in her L hand, some color change, and protrusion noted along medial L arm     PAIN:  Are you having pain? Yes: NPRS scale:  2-3/10    OBJECTIVE: (objective measures completed at initial evaluation unless otherwise dated)     Patient Surveys  FOTO: 32, predicted score of 60   Cognition Patient is oriented to person, place, and time.  Recent memory is intact.  Remote memory is intact.  Attention span and concentration are intact.  Expressive speech is intact.  Patient's fund of knowledge is within normal limits for educational level.                          Gross Musculoskeletal Assessment Tremor: None Bulk: Normal Tone: Normal     Posture Mild forward head, protracted scapulae     Cervical Screen   AROM     AROM (Normal range in degrees) AROM 12/13/2021    Right Left  Shoulder      Flexion   Deferred  Extension   Deferred  Abduction   Deferred  External Rotation   Deferred  Internal Rotation   Deferred  Hands Behind Head   Deferred  Hands Behind Back   Deferred         Elbow      Flexion      Extension      Pronation      Supination      (* = pain; Blank rows = not tested)     PROM     PROM (Normal range in degrees) PROM 12/13/2021    Right Left  Shoulder      Flexion   50  Extension      Abduction      External Rotation   15  Internal Rotation   45  Hands Behind Head      Hands Behind Back             Elbow      Flexion   WNL  Extension   -10  Pronation   WNL  Supination   WNL  (* = pain; Blank rows = not tested)     LE MMT: MMT deferred due to early post-op status* MMT (out of 5) Right 12/13/2021 Left 12/13/2021  Shoulder   Flexion      Extension      Abduction      External rotation      Internal rotation      Horizontal abduction      Horizontal adduction      Lower Trapezius      Rhomboids             Elbow  Flexion      Extension      Pronation      Supination             Wrist  Flexion      Extension      Radial deviation      Ulnar deviation             (* = pain; Blank rows = not tested)   Sensation Deferred    Reflexes Deferred     Palpation   Location LEFT  RIGHT           Subocciptials      Cervical paraspinals      Upper Trapezius 0    Levator Scapulae      Rhomboid Major/Minor      Sternoclavicular joint 0    Acromioclavicular joint 2    Coracoid process 2    Bicipital groove 2    Supraspinatus 1    Infraspinatus 0    Subscapularis      Teres Minor      Teres Major      Pectoralis Major 1    Pectoralis Minor      Anterior Deltoid 1    Lateral Deltoid 1    Posterior Deltoid 0    Latissimus Dorsi      Sternocleidomastoid      (Blank rows = not tested) Graded on 0-4 scale (0 = no pain, 1 = pain, 2 = pain with wincing/grimacing/flinching, 3 = pain with withdrawal, 4 = unwilling to allow palpation), (Blank rows = not tested)         TODAY'S TREATMENT      Manual Therapy - for R shoulder ROM and to prevent R shoulder stiffness   R shoulder PROM into flexion and ER to neutral, ROM within pt tolerance, gentle oscillations of upper limb during ROM; x 15 minutes R elbow PROM for flexion/extension and pronation/supination; x 5 minutes Gentle STM to R biceps, anterior and middle deltoid; x 3 minutes     Therapeutic Exercise - for HEP establishment, discussion on appropriate exercise/activity modification, PT education   Pendulums, forward/backward, side-to-side circles CW and CCW; x20 each with well arm propped on edge of treatment table Scapular retraction; reviewed Wrist AROM, flexion/extension and RD/UD; reviewed Gripping; yellow putty; 2 minutes   Assisted patient with doffing and donning her post-operative sling in standing position.    PATIENT EDUCATION: Reviewed current post-operative restrictions, techniques for home exercises   Cold pack (unbilled) - for anti-inflammatory and analgesic effect as needed for reduced pain and improved ability to participate in active PT intervention, along L shoulder in sitting, x 5 minutes      PATIENT EDUCATION:  Education  details: Plan of care, HEP Person educated: Patient Education method: Explanation, Handout, Return Demonstration Education comprehension: verbalized understanding     HOME EXERCISE PROGRAM: Access Code: [email or web address] URL: [email or web address] Date: 12/14/2021 Prepared by: Consuela Mimes   Exercises - Flexion-Extension Shoulder Pendulum with Table Support  - 2 x daily - 7 x weekly - 2 sets - 10 reps -  Horizontal Shoulder Pendulum with Table Support  - 2 x daily - 7 x weekly - 2 sets - 10 reps - Seated Scapular Retraction  - 2 x daily - 7 x weekly - 2 sets - 10 reps - 3sec hold     ASSESSMENT:   CLINICAL IMPRESSION:  Patient arrives with excellent motivation to participate in physical therapy. Pt is able to markedly improve degree of forward flexion tolerated this evening with PROM of forward flexion up to 120 deg. Limiting ER at this time and allowing no shoulder hyperextension to protect biceps tenodesis. Reviewed technique for pendulums to ensure minimal active motion during this exercise at home. Patient is nearing end of protective phase of rehab and will be able to initiate gentle A/AAROM after next week. Patient has remaining deficits including: significant L shoulder ROM deficits, local nociceptive L shoulder pain, edema, decreased L shoulder/elbow strength. Patient will benefit from continued skilled therapeutic intervention to address the above deficits as needed for improved function and QoL.     REHAB POTENTIAL: Excellent   CLINICAL DECISION MAKING: Evolving/moderate complexity   EVALUATION COMPLEXITY: Low     GOALS: Goals reviewed with patient? Yes   SHORT TERM GOALS: Target date: 01/10/2022   Pt will be independent with HEP to improve strength and decrease neck pain to improve pain-free function at home and work. Baseline: 12/12/21: Baseline HEP initiated Goal status: INITIAL     LONG TERM GOALS: Target date: 02/07/2022   Pt will increase FOTO to at  least 60 to demonstrate significant improvement in function at home and work related to neck pain  Baseline: 12/12/21: 29 Goal status: INITIAL   2.   Pt will have L shoulder AROM at least within 10 degrees of contralateral upper extremity indicative of improved ROM as needed for reaching, overhead work, self-care activities/dressing/grooming Baseline: 12/12/21: NO AROM presently, severely limited PROM in early post-op phase.    Goal status: INITIAL   3. Pt will improve L shoulder strength to at least 4+/5 or greater for all motions measured as needed for improved capacity to perform lifting and stabilize shoulder during working with power tools to complete home improvement projects and swing golf club       Baseline: 12/12/21: No MMTs due to early post-op status Goal status: INITIAL   4. Pt will demonstrate golf swing with sound technique and no reproduction of shoulder pain as needed for return to golfing Baseline: 12/12/21: Unable to complete shoulder AROM or compound motion required for golf swing Goal status: INITIAL     PLAN: PT FREQUENCY: 2x/week   PT DURATION: 8 weeks   PLANNED INTERVENTIONS: Therapeutic exercises, Therapeutic activity, Neuromuscular re-education, Patient/Family education, Joint mobilization, Dry Needling, Electrical stimulation, Cryotherapy, Moist heat, Traction, and Manual therapy   PLAN FOR NEXT SESSION: Shoulder ROM, no A/AAROM until 4 weeks post-op, wrist/hand AROM, periscapular isometrics, cryotherapy for post-operative shoulder prn. Wean from sling next visit.     Consuela Mimes, PT, DPT #[id number]  Gertie Exon, PT 12/21/2021, 5:41 PM

## 2021-12-23 ENCOUNTER — Encounter: Admit: 2021-12-23 | Payer: PRIVATE HEALTH INSURANCE | Attending: Internal Medicine | Primary: Internal Medicine

## 2021-12-26 ENCOUNTER — Ambulatory Visit: Payer: Managed Care, Other (non HMO) | Admitting: Physical Therapy

## 2021-12-26 ENCOUNTER — Encounter: Payer: Self-pay | Admitting: Physical Therapy

## 2021-12-26 DIAGNOSIS — M25512 Pain in left shoulder: Secondary | ICD-10-CM

## 2021-12-26 DIAGNOSIS — M25612 Stiffness of left shoulder, not elsewhere classified: Secondary | ICD-10-CM

## 2021-12-26 DIAGNOSIS — M6281 Muscle weakness (generalized): Secondary | ICD-10-CM

## 2021-12-26 NOTE — Therapy (Unsigned)
OUTPATIENT PHYSICAL THERAPY TREATMENT NOTE   Patient Name: Ariana Jefferson MRN: [id number] DOB:[birth year], 61 y.o., female Today's Date: 12/26/2021  PCP: Leim Fabry, MD REFERRING PROVIDER: Gunnar Fusi, PA  END OF SESSION:   PT End of Session - 12/26/21 1733     Visit Number 4    Number of Visits 17    Date for PT Re-Evaluation 02/10/22    Authorization Type [employer] 2023, 60 combined PT/OT/Chiro    Progress Note Due on Visit 10    PT Start Time 1713    PT Stop Time 1756    PT Time Calculation (min) 43 min    Equipment Utilized During Treatment --    Activity Tolerance Patient tolerated treatment well;Patient limited by pain    Behavior During Therapy WFL for tasks assessed/performed              Past Medical History:  Diagnosis Date   High cholesterol    Past Surgical History:  Procedure Laterality Date   [place]ARE[place] SECTION     CHOLECYSTECTOMY     KNEE SURGERY     [place] GASTRIC SLEEVE RESECTION     Patient Active Problem List   Diagnosis Date Noted   Hx of skin cancer, basal cell 02/07/2018   Obesity 02/07/2018   High cholesterol 12/11/2016   Low vitamin B12 level 12/11/2016   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   Lung nodule seen on imaging study 11/13/2014   DJD (degenerative joint disease) 05/14/2013    REFERRING DIAG:  D98.338 (ICD-10-CM) - S/P arthroscopy of left shoulder  Z01.818 (ICD-10-CM) - Encounter for other preprocedural examination    THERAPY DIAG:  Acute pain of left shoulder  Stiffness of left shoulder, not elsewhere classified  Muscle weakness (generalized)  Rationale for Evaluation and Treatment Rehabilitation  PERTINENT HISTORY: Pt is a 61 year old female s/p L shoulder arthroscopy with biceps tenodesis, resection of adhesions, SAD, DCE, debridement. Patient reports no post-op complications. Pt reports post-op pain is better than she anticipated; pt reports mild pain presently. Patient reports no instructions yet on  exercises to do with shoulder. Pt has weaned from opioid analgesic pain medicine; pt alternates Tylenol and Ibuprofen as needed. Pt denies recent physical trauma. Pt had emergency hemicholectomy earlier this year with good post-op recovery. Pt has been compliant with sling use as directed by her surgeon.    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, 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 (personal history of cancer, chills/fever, night sweats, nausea, vomiting, unrelenting pain):              -hx of basal cell carcinoma, lung nodule on imaging study,  otherwise negative    Living Environment Lives with: lives with their spouse Lives in: House/apartment     Patient Goals: Patient wants to return to golf, being able to remodel her home/paint, able to reach above head     PRECAUTIONS: No active biceps x 6 weeks, no resisted elbow flexion or supination, A/AAROM at 4 weeks post-op   DOS: 11/24/21  SUBJECTIVE:                                                                                                                                                                                      SUBJECTIVE STATEMENT:  Patient sent message to her MD this past Thursday, and she was instructed on weaning from abduction pillow and sling. She has weaned from these and has reduced edema in L upper limb.    PAIN:  Are you having pain? No pain at  rest, 1-2/10 with PROM    OBJECTIVE: (objective measures completed at initial evaluation unless otherwise dated)     Patient Surveys  FOTO: [id number], predicted score of 60   Cognition Patient is oriented to person, place, and time.  Recent memory is intact.  Remote memory is intact.  Attention span and concentration are intact.  Expressive speech is intact.  Patient's fund of knowledge is within normal limits for educational level.                          Gross Musculoskeletal Assessment Tremor: None Bulk: Normal Tone: Normal     Posture Mild forward head, protracted scapulae     Cervical Screen   AROM     AROM (Normal range in degrees) AROM 12/13/2021    Right Left  Shoulder      Flexion   Deferred  Extension   Deferred  Abduction   Deferred  External Rotation   Deferred  Internal Rotation   Deferred  Hands Behind Head   Deferred  Hands Behind Back   Deferred         Elbow      Flexion      Extension      Pronation      Supination      (* = pain; Blank rows = not tested)     PROM     PROM (Normal range in degrees) PROM 12/13/2021    Right Left  Shoulder      Flexion   50  Extension      Abduction      External Rotation   15  Internal Rotation   45  Hands Behind Head      Hands Behind Back             Elbow      Flexion   WNL  Extension   -10  Pronation   WNL  Supination   WNL  (* = pain; Blank rows = not tested)     LE MMT: MMT deferred due to early post-op status* MMT (out of 5) Right  12/13/2021 Left 12/13/2021         Shoulder   Flexion      Extension      Abduction      External rotation      Internal rotation      Horizontal abduction      Horizontal adduction      Lower Trapezius      Rhomboids             Elbow  Flexion      Extension      Pronation      Supination             Wrist  Flexion      Extension      Radial deviation      Ulnar deviation             (* = pain; Blank rows = not tested)    Sensation Deferred   Reflexes Deferred     Palpation   Location LEFT  RIGHT           Subocciptials      Cervical paraspinals      Upper Trapezius 0    Levator Scapulae      Rhomboid Major/Minor      Sternoclavicular joint 0    Acromioclavicular joint 2    Coracoid process 2    Bicipital groove 2    Supraspinatus 1    Infraspinatus 0    Subscapularis      Teres Minor      Teres Major      Pectoralis Major 1    Pectoralis Minor      Anterior Deltoid 1    Lateral Deltoid 1    Posterior Deltoid 0    Latissimus Dorsi      Sternocleidomastoid      (Blank rows = not tested) Graded on 0-4 scale (0 = no pain, 1 = pain, 2 = pain with wincing/grimacing/flinching, 3 = pain with withdrawal, 4 = unwilling to allow palpation), (Blank rows = not tested)         TODAY'S TREATMENT      Manual Therapy - for R shoulder ROM and to prevent R shoulder stiffness   R shoulder PROM into flexion and ER to neutral, ROM within pt tolerance, gentle oscillations of upper limb during ROM; x 20 minutes R elbow PROM for flexion/extension and pronation/supination; x 3 minutes Gentle STM to R biceps, anterior and middle deltoid; x 3 minutes     Therapeutic Exercise - for shoulder complex ROM as needed for ability to perform reaching and self-care/ADLs    Wand ER AAROM: x10, 3 sec Wand flexion AAROM; 2x10, 3 sec hold  -utilized elbow flexion for return to neutral from shoulder elevation to improve tolerance Pulley; flexion, 2 minutes AAROM  Assisted patient with doffing and donning her post-operative sling in standing position.    PATIENT EDUCATION: Discussed at length current clinical presentation and low index of suspicion for tear of biceps given no significant mechanism and limited clinical history in spite of noted protrusion along medial upper arm. Discussed possible edema and use of active muscle pump with gripping/hand and wrist AROM to facilitate flow of blood and lymph to central  circulation.      *not today* Pendulums, forward/backward, side-to-side circles CW and CCW; x20 each with well arm propped on edge of treatment table Scapular retraction; reviewed Cold pack (unbilled) - for anti-inflammatory and analgesic effect as needed for reduced  pain and improved ability to participate in active PT intervention, along L shoulder in sitting, x 5 minutes Gripping; pink putty; 2 minutes Wrist AROM, flexion/extension and RD/UD; reviewed      PATIENT EDUCATION:  Education details: Plan of care, HEP Person educated: Patient Education method: Explanation, Handout, Return Demonstration Education comprehension: verbalized understanding     HOME EXERCISE PROGRAM: Access Code: [email or web address] URL: [email or web address] Date: 12/14/2021 Prepared by: Consuela Mimes   Exercises - Flexion-Extension Shoulder Pendulum with Table Support  - 2 x daily - 7 x weekly - 2 sets - 10 reps - Horizontal Shoulder Pendulum with Table Support  - 2 x daily - 7 x weekly - 2 sets - 10 reps - Seated Scapular Retraction  - 2 x daily - 7 x weekly - 2 sets - 10 reps - 3sec hold     ASSESSMENT:   CLINICAL IMPRESSION:  Patient voices concerns with trophic change along L medial upper arm with noted edema along medial upper arm - low index of suspicion for tearing of proximal biceps tenodesis. Pt does have edema in L hand with moderate erythema. Pt does not have signs of DVT at this time. Discussed use of active muscle pump to faciliate flow of blood/lymph to central circulation. Informed pt she could use MyChart system to send photo to MD if this worsens or does not improve. Pt is appropriate for weaning from sling starting next visit. She is progressing well with PROM. Will continue to monitor current condition and refer to MD if needed. Patient has remaining deficits including: significant L shoulder ROM deficits, local nociceptive L shoulder pain, edema, decreased L shoulder/elbow  strength. Patient will benefit from continued skilled therapeutic intervention to address the above deficits as needed for improved function and QoL.     REHAB POTENTIAL: Excellent   CLINICAL DECISION MAKING: Evolving/moderate complexity   EVALUATION COMPLEXITY: Low     GOALS: Goals reviewed with patient? Yes   SHORT TERM GOALS: Target date: 01/10/2022   Pt will be independent with HEP to improve strength and decrease neck pain to improve pain-free function at home and work. Baseline: 12/12/21: Baseline HEP initiated Goal status: INITIAL     LONG TERM GOALS: Target date: 02/07/2022   Pt will increase FOTO to at least 60 to demonstrate significant improvement in function at home and work related to neck pain  Baseline: 12/12/21: 29 Goal status: INITIAL   2.   Pt will have L shoulder AROM at least within 10 degrees of contralateral upper extremity indicative of improved ROM as needed for reaching, overhead work, self-care activities/dressing/grooming Baseline: 12/12/21: NO AROM presently, severely limited PROM in early post-op phase.    Goal status: INITIAL   3. Pt will improve L shoulder strength to at least 4+/5 or greater for all motions measured as needed for improved capacity to perform lifting and stabilize shoulder during working with power tools to complete home improvement projects and swing golf club       Baseline: 12/12/21: No MMTs due to early post-op status Goal status: INITIAL   4. Pt will demonstrate golf swing with sound technique and no reproduction of shoulder pain as needed for return to golfing Baseline: 12/12/21: Unable to complete shoulder AROM or compound motion required for golf swing Goal status: INITIAL     PLAN: PT FREQUENCY: 2x/week   PT DURATION: 8 weeks   PLANNED INTERVENTIONS: Therapeutic exercises, Therapeutic activity, Neuromuscular re-education, Patient/Family education, Joint mobilization, Dry Needling, Electrical stimulation, Cryotherapy,  Moist  heat, Traction, and Manual therapy   PLAN FOR NEXT SESSION: Shoulder ROM, no A/AAROM until 4 weeks post-op, wrist/hand AROM, periscapular isometrics, cryotherapy for post-operative shoulder prn. Wean from sling next visit.     Consuela Mimes, PT, DPT #[id number]  Gertie Exon, PT 12/26/2021, 5:34 PM

## 2021-12-28 ENCOUNTER — Encounter: Admit: 2021-12-28 | Payer: PRIVATE HEALTH INSURANCE | Attending: Internal Medicine | Primary: Internal Medicine

## 2021-12-28 ENCOUNTER — Ambulatory Visit: Payer: Managed Care, Other (non HMO) | Admitting: Physical Therapy

## 2021-12-28 ENCOUNTER — Encounter: Payer: Self-pay | Admitting: Physical Therapy

## 2021-12-28 DIAGNOSIS — M25512 Pain in left shoulder: Secondary | ICD-10-CM

## 2021-12-28 DIAGNOSIS — M6281 Muscle weakness (generalized): Secondary | ICD-10-CM

## 2021-12-28 DIAGNOSIS — M25612 Stiffness of left shoulder, not elsewhere classified: Secondary | ICD-10-CM

## 2021-12-28 MED ORDER — DEXMETHYLPHENIDATE ER 20 MG CAPSULE,EXTENDED RELEASE BIPHASIC50-50
20 | ORAL_CAPSULE | ORAL | 1 refills | 30.00000 days | Status: AC
Start: 2021-12-28 — End: 2022-01-24

## 2021-12-28 NOTE — Therapy (Signed)
OUTPATIENT PHYSICAL THERAPY TREATMENT NOTE   Patient Name: Ariana Jefferson MRN: [id number] DOB:May 04, 1960, 61 y.o., female Today's Date: 01/02/2022  PCP: Leim Fabry, MD REFERRING PROVIDER: Gunnar Fusi, PA  END OF SESSION:   PT End of Session - 01/02/22 0540     Visit Number 5    Number of Visits 17    Date for PT Re-Evaluation 02/10/22    Authorization Type [employer] 2023, 60 combined PT/OT/Chiro    Progress Note Due on Visit 10    PT Start Time 1715    PT Stop Time 1758    PT Time Calculation (min) 43 min    Activity Tolerance Patient tolerated treatment well;Patient limited by pain    Behavior During Therapy WFL for tasks assessed/performed               Past Medical History:  Diagnosis Date   High cholesterol    Past Surgical History:  Procedure Laterality Date   CESAREAN SECTION     CHOLECYSTECTOMY     KNEE SURGERY     LAPAROSCOPIC GASTRIC SLEEVE RESECTION     Patient Active Problem List   Diagnosis Date Noted   Hx of skin cancer, basal cell 02/07/2018   Obesity 02/07/2018   High cholesterol 12/11/2016   Low vitamin B12 level 12/11/2016   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   Lung nodule seen on imaging study 11/13/2014   DJD (degenerative joint disease) 05/14/2013    REFERRING DIAG:  W09.811 (ICD-10-CM) - S/P arthroscopy of left shoulder  Z01.818 (ICD-10-CM) - Encounter for other preprocedural examination    THERAPY DIAG:  Acute pain of left shoulder  Stiffness of left shoulder, not elsewhere classified  Muscle weakness (generalized)  Rationale for Evaluation and Treatment Rehabilitation  PERTINENT HISTORY: Pt is a 61 year old female s/p L shoulder arthroscopy with biceps tenodesis, resection of adhesions, SAD, DCE, debridement. Patient reports no post-op complications. Pt reports post-op pain is better than she anticipated; pt reports mild pain presently. Patient reports no instructions yet on exercises to do with shoulder. Pt has  weaned from opioid analgesic pain medicine; pt alternates Tylenol and Ibuprofen as needed. Pt denies recent physical trauma. Pt had emergency hemicholectomy earlier this year with good post-op recovery. Pt has been compliant with sling use as directed by her surgeon.    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, 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 (personal history of cancer, chills/fever, night sweats, nausea, vomiting, unrelenting pain):              -hx of basal cell carcinoma, lung nodule on imaging study,  otherwise negative    Living Environment Lives with: lives with their spouse Lives in: House/apartment     Patient Goals: Patient wants to return to golf, being able to remodel her home/paint, able to reach above head     PRECAUTIONS: No active biceps x 6 weeks, no resisted elbow flexion or supination, A/AAROM at 4 weeks post-op   DOS: 11/24/21   SUBJECTIVE:  SUBJECTIVE STATEMENT:  Patient reports doing well with weaning from sling this week. She reports mild pain in shoulder at this time, though she did have significant pain with ER AAROM exercise at home; this was performed without towel roll/bolster behind upper arm. She reports tolerating  other AAROM drills well.    PAIN:  Are you having pain? No pain at rest, 1-2/10 with PROM    OBJECTIVE: (objective measures completed at initial evaluation unless otherwise dated)     Patient Surveys  FOTO: 3, predicted score of 60   Cognition Patient is oriented to person, place, and time.  Recent memory is intact.  Remote memory is intact.  Attention span and concentration are intact.  Expressive speech is intact.  Patient's fund of knowledge is within normal limits for educational level.                          Gross Musculoskeletal Assessment Tremor: None Bulk: Normal Tone: Normal     Posture Mild forward head, protracted scapulae     Cervical Screen   AROM     AROM (Normal range in degrees) AROM 12/13/2021    Right Left  Shoulder      Flexion   Deferred  Extension   Deferred  Abduction   Deferred  External Rotation   Deferred  Internal Rotation   Deferred  Hands Behind Head   Deferred  Hands Behind Back   Deferred         Elbow      Flexion      Extension      Pronation      Supination      (* = pain; Blank rows = not tested)     PROM     PROM (Normal range in degrees) PROM 12/13/2021    Right Left  Shoulder      Flexion   50  Extension      Abduction      External Rotation   15  Internal Rotation   45  Hands Behind Head      Hands Behind Back             Elbow      Flexion   WNL  Extension   -10  Pronation   WNL  Supination   WNL  (* = pain; Blank rows = not tested)     LE MMT: MMT deferred due to early post-op status* MMT (out of 5) Right 12/13/2021 Left 12/13/2021         Shoulder   Flexion      Extension      Abduction      External rotation      Internal rotation      Horizontal abduction      Horizontal adduction      Lower Trapezius      Rhomboids             Elbow  Flexion      Extension      Pronation      Supination             Wrist  Flexion      Extension      Radial deviation      Ulnar  deviation             (* = pain; Blank rows = not tested)   Sensation Deferred   Reflexes Deferred  Palpation   Location LEFT  RIGHT           Subocciptials      Cervical paraspinals      Upper Trapezius 0    Levator Scapulae      Rhomboid Major/Minor      Sternoclavicular joint 0    Acromioclavicular joint 2    Coracoid process 2    Bicipital groove 2    Supraspinatus 1    Infraspinatus 0    Subscapularis      Teres Minor      Teres Major      Pectoralis Major 1    Pectoralis Minor      Anterior Deltoid 1    Lateral Deltoid 1    Posterior Deltoid 0    Latissimus Dorsi      Sternocleidomastoid      (Blank rows = not tested) Graded on 0-4 scale (0 = no pain, 1 = pain, 2 = pain with wincing/grimacing/flinching, 3 = pain with withdrawal, 4 = unwilling to allow palpation), (Blank rows = not tested)         TODAY'S TREATMENT      Manual Therapy - for R shoulder ROM and to prevent R shoulder stiffness   R shoulder PROM into flexion and ER to neutral, ROM within pt tolerance, gentle oscillations of upper limb during ROM; x 15 minutes R elbow PROM for flexion/extension and pronation/supination; x 1 minutes Gentle STM to R biceps, anterior and middle deltoid, pectoralis major; x 5 minutes Gentle gr I-II A-P mobilizations for pain control; 2x30sec     Therapeutic Exercise - for shoulder complex ROM as needed for ability to perform reaching and self-care/ADLs    Wand ER AAROM: 2x10, 3 sec Wand flexion AAROM; 2x10, 3 sec hold Ball roll across table, flexion AND abduction; x10 ea dir  with arm elevating to 90 deg (raised high-low table)     PATIENT EDUCATION: Reviewed exercise technique for HEP   *next visit* Elbow extension AAROM Shoulder isometrics   *not today* Pulley; flexion, 2 minutes AAROM Pendulums, forward/backward, side-to-side circles CW and CCW; x20 each with well arm propped on edge of treatment table Scapular retraction; reviewed Cold pack  (unbilled) - for anti-inflammatory and analgesic effect as needed for reduced pain and improved ability to participate in active PT intervention, along L shoulder in sitting, x 5 minutes Gripping; pink putty; 2 minutes Wrist AROM, flexion/extension and RD/UD; reviewed      PATIENT EDUCATION:  Education details: see above for patient education details Person educated: Patient Education method: Explanation, Handout Education comprehension: verbalized understanding     HOME EXERCISE PROGRAM: Access Code: [email or web address]      ASSESSMENT:   CLINICAL IMPRESSION:  Patient is able to tolerate L shoulder flexion PROM WFL. She is progressing with AAROM, though she still has mild ROM deficits compared to contralateral shoulder. Tolerance of L shoulder external rotation AAROM is improved with towel behind upper arm to limit shoulder extension. Pt overall participates very well with physical therapy, but she does have intermittent discomfort with shoulder AAROM that is typical early in A/AAROM phase of rehab. Patient has remaining deficits including: significant L shoulder ROM deficits, local nociceptive L shoulder pain, edema, decreased L shoulder/elbow strength. Patient will benefit from continued skilled therapeutic intervention to address the above deficits as needed for improved function and QoL.     REHAB POTENTIAL: Excellent   CLINICAL DECISION MAKING: Evolving/moderate complexity   EVALUATION COMPLEXITY: Low  GOALS: Goals reviewed with patient? Yes   SHORT TERM GOALS: Target date: 01/10/2022   Pt will be independent with HEP to improve strength and decrease neck pain to improve pain-free function at home and work. Baseline: 12/12/21: Baseline HEP initiated Goal status: INITIAL     LONG TERM GOALS: Target date: 02/07/2022   Pt will increase FOTO to at least 60 to demonstrate significant improvement in function at home and work related to neck pain  Baseline: 12/12/21: 29 Goal  status: INITIAL   2.   Pt will have L shoulder AROM at least within 10 degrees of contralateral upper extremity indicative of improved ROM as needed for reaching, overhead work, self-care activities/dressing/grooming Baseline: 12/12/21: NO AROM presently, severely limited PROM in early post-op phase.    Goal status: INITIAL   3. Pt will improve L shoulder strength to at least 4+/5 or greater for all motions measured as needed for improved capacity to perform lifting and stabilize shoulder during working with power tools to complete home improvement projects and swing golf club       Baseline: 12/12/21: No MMTs due to early post-op status Goal status: INITIAL   4. Pt will demonstrate golf swing with sound technique and no reproduction of shoulder pain as needed for return to golfing Baseline: 12/12/21: Unable to complete shoulder AROM or compound motion required for golf swing Goal status: INITIAL     PLAN: PT FREQUENCY: 2x/week   PT DURATION: 8 weeks   PLANNED INTERVENTIONS: Therapeutic exercises, Therapeutic activity, Neuromuscular re-education, Patient/Family education, Joint mobilization, Dry Needling, Electrical stimulation, Cryotherapy, Moist heat, Traction, and Manual therapy   PLAN FOR NEXT SESSION: Continue with shoulder A/AAROM for gradually restoring full ROM, periscapular isotonics, 3-way shoulder isometrics     Consuela Mimes, PT, DPT #[id number]  Gertie Exon, PT 01/02/2022, 5:40 AM

## 2022-01-02 ENCOUNTER — Encounter: Payer: Self-pay | Admitting: Physical Therapy

## 2022-01-02 ENCOUNTER — Ambulatory Visit: Payer: Managed Care, Other (non HMO) | Admitting: Physical Therapy

## 2022-01-02 DIAGNOSIS — M25612 Stiffness of left shoulder, not elsewhere classified: Secondary | ICD-10-CM

## 2022-01-02 DIAGNOSIS — M25512 Pain in left shoulder: Secondary | ICD-10-CM

## 2022-01-02 DIAGNOSIS — M6281 Muscle weakness (generalized): Secondary | ICD-10-CM

## 2022-01-02 NOTE — Telephone Encounter
I spoke to [place] from [place]. Irma stated that standard appeal has turnaround time of 30-60 days. I will push out due date two weeks which should make 30 days for follow up. Irma recommened that I call the member services number on the back of the insurance card to check on status of Appeal.Yoona Ishii, CPHTMedication Management Clinic [facility] [address], [place] [address]U: [phone]: [phone]

## 2022-01-02 NOTE — Therapy (Signed)
OUTPATIENT PHYSICAL THERAPY TREATMENT NOTE   Patient Name: Ariana Jefferson MRN: [id number] DOB:Mar 06, 1960, 61 y.o., female Today's Date: 01/04/2022  PCP: Leim Fabry, MD REFERRING PROVIDER: Gunnar Fusi, PA  END OF SESSION:   PT End of Session - 01/04/22 0746     Visit Number 6    Number of Visits 17    Date for PT Re-Evaluation 02/10/22    Authorization Type [employer] 2023, 60 combined PT/OT/Chiro    Progress Note Due on Visit 10    PT Start Time 1718    PT Stop Time 1806    PT Time Calculation (min) 48 min    Activity Tolerance Patient tolerated treatment well;Patient limited by pain    Behavior During Therapy WFL for tasks assessed/performed                Past Medical History:  Diagnosis Date   High cholesterol    Past Surgical History:  Procedure Laterality Date   CESAREAN SECTION     CHOLECYSTECTOMY     KNEE SURGERY     LAPAROSCOPIC GASTRIC SLEEVE RESECTION     Patient Active Problem List   Diagnosis Date Noted   Hx of skin cancer, basal cell 02/07/2018   Obesity 02/07/2018   High cholesterol 12/11/2016   Low vitamin B12 level 12/11/2016   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   Lung nodule seen on imaging study 11/13/2014   DJD (degenerative joint disease) 05/14/2013    REFERRING DIAG:  M42.683 (ICD-10-CM) - S/P arthroscopy of left shoulder  Z01.818 (ICD-10-CM) - Encounter for other preprocedural examination    THERAPY DIAG:  Acute pain of left shoulder  Stiffness of left shoulder, not elsewhere classified  Muscle weakness (generalized)  Rationale for Evaluation and Treatment Rehabilitation  PERTINENT HISTORY: Pt is a 61 year old female s/p L shoulder arthroscopy with biceps tenodesis, resection of adhesions, SAD, DCE, debridement. Patient reports no post-op complications. Pt reports post-op pain is better than she anticipated; pt reports mild pain presently. Patient reports no instructions yet on exercises to do with shoulder. Pt has  weaned from opioid analgesic pain medicine; pt alternates Tylenol and Ibuprofen as needed. Pt denies recent physical trauma. Pt had emergency hemicholectomy earlier this year with good post-op recovery. Pt has been compliant with sling use as directed by her surgeon.    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, 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 (personal history of cancer, chills/fever, night sweats, nausea, vomiting, unrelenting pain):              -hx of basal cell carcinoma, lung nodule on imaging study,  otherwise negative    Living Environment Lives with: lives with their spouse Lives in: House/apartment     Patient Goals: Patient wants to return to golf, being able to remodel her home/paint, able to reach above head     PRECAUTIONS: No active biceps x 6 weeks, no resisted elbow flexion or supination, A/AAROM at 4 weeks post-op   DOS: 11/24/21   SUBJECTIVE:  SUBJECTIVE STATEMENT:  Patient reports having good f/u with her surgeon. She reports continuous nagging pain affecting R shoulder. She reports pain along distal clavicle and anterior shoulder/bicipital groove region. Patient was given new Rx for pain control. She reports difficulty with  comfort at night.    PAIN:  Are you having pain? No pain at rest, 1-2/10 with PROM    OBJECTIVE: (objective measures completed at initial evaluation unless otherwise dated)     Patient Surveys  FOTO: 60, predicted score of 60   Cognition Patient is oriented to person, place, and time.  Recent memory is intact.  Remote memory is intact.  Attention span and concentration are intact.  Expressive speech is intact.  Patient's fund of knowledge is within normal limits for educational level.                          Gross Musculoskeletal Assessment Tremor: None Bulk: Normal Tone: Normal     Posture Mild forward head, protracted scapulae     Cervical Screen   AROM     AROM (Normal range in degrees) AROM 12/13/2021    Right Left  Shoulder      Flexion   Deferred  Extension   Deferred  Abduction   Deferred  External Rotation   Deferred  Internal Rotation   Deferred  Hands Behind Head   Deferred  Hands Behind Back   Deferred         Elbow      Flexion      Extension      Pronation      Supination      (* = pain; Blank rows = not tested)     PROM     PROM (Normal range in degrees) PROM 12/13/2021    Right Left  Shoulder      Flexion   50  Extension      Abduction      External Rotation   15  Internal Rotation   45  Hands Behind Head      Hands Behind Back             Elbow      Flexion   WNL  Extension   -10  Pronation   WNL  Supination   WNL  (* = pain; Blank rows = not tested)     LE MMT: MMT deferred due to early post-op status* MMT (out of 5) Right 12/13/2021 Left 12/13/2021         Shoulder   Flexion      Extension      Abduction      External rotation      Internal rotation      Horizontal abduction      Horizontal adduction      Lower Trapezius      Rhomboids             Elbow  Flexion      Extension      Pronation      Supination             Wrist  Flexion      Extension      Radial deviation      Ulnar deviation              (* = pain; Blank rows = not tested)   Sensation Deferred   Reflexes Deferred     Palpation   Location  LEFT  RIGHT           Subocciptials      Cervical paraspinals      Upper Trapezius 0    Levator Scapulae      Rhomboid Major/Minor      Sternoclavicular joint 0    Acromioclavicular joint 2    Coracoid process 2    Bicipital groove 2    Supraspinatus 1    Infraspinatus 0    Subscapularis      Teres Minor      Teres Major      Pectoralis Major 1    Pectoralis Minor      Anterior Deltoid 1    Lateral Deltoid 1    Posterior Deltoid 0    Latissimus Dorsi      Sternocleidomastoid      (Blank rows = not tested) Graded on 0-4 scale (0 = no pain, 1 = pain, 2 = pain with wincing/grimacing/flinching, 3 = pain with withdrawal, 4 = unwilling to allow palpation), (Blank rows = not tested)         TODAY'S TREATMENT      Manual Therapy - for R shoulder ROM and to prevent R shoulder stiffness   R shoulder PROM into flexion and ER to neutral, ROM within pt tolerance, gentle oscillations of upper limb during ROM; x 10 minutes R elbow PROM for flexion/extension and pronation/supination; x 1 minutes Gentle STM to R biceps, anterior and middle deltoid, pectoralis major; x 5 minutes Gentle gr I-II A-P mobilizations for pain control; 2x30sec     Therapeutic Exercise - for shoulder complex ROM as needed for ability to perform reaching and self-care/ADLs   Upper body ergometer, 2.5 minutes forward, 2.5 minutes backward - for tissue warm-up to improve muscle performance, improved soft tissue mobility/extensibility - 3 minutes unbilled time, subjective information gathered during billed time   Wand flexion AAROM; 2x10, 3 sec hold  -elbow flexion for return to neutral Wand ER AAROM, in standing today: 20x, 3 sec Ball roll up wall, yellow physioball; x10  PATIENT EDUCATION: Discusses strategies for positioning arm for improved comfort while pt is lying/resting. Reviewed  current restrictions and allowance for AROM as tolerated without loading biceps.    *next visit* Shoulder isometrics   *not today* Pulley; flexion, 2 minutes AAROM Pendulums, forward/backward, side-to-side circles CW and CCW; x20 each with well arm propped on edge of treatment table Scapular retraction; reviewed Gripping; pink putty; 2 minutes Wrist AROM, flexion/extension and RD/UD; reviewed     Cold pack (unbilled) - post-treatment; for anti-inflammatory and analgesic effect as needed for reduced pain and improved ability to participate in active PT intervention, along L shoulder in sitting, x 5 minutes      PATIENT EDUCATION:  Education details: see above for patient education details Person educated: Patient Education method: Explanation, Handout Education comprehension: verbalized understanding     HOME EXERCISE PROGRAM: Access Code: [email or web address]      ASSESSMENT:   CLINICAL IMPRESSION:  Patient demonstrates improving shoulder ROM and has improved forward elevation and ER with PROM/AAROM today with repeated bouts of gently moving into end-range. Patient has intermittent significant pain at lower shoulder flexion ROM with elbow extended; this is improved with shortening biceps via elbow flexion. Isolated elbow ROM is WFL with mild tightness reported with end-range elbow extension. Pt has had intermittent difficulty with pain control, especially at night, and her MD has given new Rx for anti-inflammatory treatment/pain management which she will be picking up this  evening. Patient has remaining deficits including: significant L shoulder ROM deficits, local nociceptive L shoulder pain, edema, decreased L shoulder/elbow strength. Patient will benefit from continued skilled therapeutic intervention to address the above deficits as needed for improved function and QoL.     REHAB POTENTIAL: Excellent   CLINICAL DECISION MAKING: Evolving/moderate complexity   EVALUATION COMPLEXITY:  Low     GOALS: Goals reviewed with patient? Yes   SHORT TERM GOALS: Target date: 01/10/2022   Pt will be independent with HEP to improve strength and decrease neck pain to improve pain-free function at home and work. Baseline: 12/12/21: Baseline HEP initiated Goal status: INITIAL     LONG TERM GOALS: Target date: 02/07/2022   Pt will increase FOTO to at least 60 to demonstrate significant improvement in function at home and work related to neck pain  Baseline: 12/12/21: 29 Goal status: INITIAL   2.   Pt will have L shoulder AROM at least within 10 degrees of contralateral upper extremity indicative of improved ROM as needed for reaching, overhead work, self-care activities/dressing/grooming Baseline: 12/12/21: NO AROM presently, severely limited PROM in early post-op phase.    Goal status: INITIAL   3. Pt will improve L shoulder strength to at least 4+/5 or greater for all motions measured as needed for improved capacity to perform lifting and stabilize shoulder during working with power tools to complete home improvement projects and swing golf club       Baseline: 12/12/21: No MMTs due to early post-op status Goal status: INITIAL   4. Pt will demonstrate golf swing with sound technique and no reproduction of shoulder pain as needed for return to golfing Baseline: 12/12/21: Unable to complete shoulder AROM or compound motion required for golf swing Goal status: INITIAL     PLAN: PT FREQUENCY: 2x/week   PT DURATION: 8 weeks   PLANNED INTERVENTIONS: Therapeutic exercises, Therapeutic activity, Neuromuscular re-education, Patient/Family education, Joint mobilization, Dry Needling, Electrical stimulation, Cryotherapy, Moist heat, Traction, and Manual therapy   PLAN FOR NEXT SESSION: Continue with shoulder A/AAROM for gradually restoring full ROM, periscapular isotonics, 3-way shoulder isometrics     Valentina Gu, PT, DPT [id number]  Eilleen Kempf, PT 01/04/2022, 7:47  AM

## 2022-01-04 ENCOUNTER — Encounter: Admit: 2022-01-04 | Payer: PRIVATE HEALTH INSURANCE | Attending: Internal Medicine | Primary: Internal Medicine

## 2022-01-04 ENCOUNTER — Ambulatory Visit: Payer: Managed Care, Other (non HMO) | Admitting: Physical Therapy

## 2022-01-04 DIAGNOSIS — M25612 Stiffness of left shoulder, not elsewhere classified: Secondary | ICD-10-CM

## 2022-01-04 DIAGNOSIS — M25512 Pain in left shoulder: Secondary | ICD-10-CM

## 2022-01-04 DIAGNOSIS — M6281 Muscle weakness (generalized): Secondary | ICD-10-CM

## 2022-01-04 MED ORDER — LOSARTAN 100 MG-HYDROCHLOROTHIAZIDE 12.5 MG TABLET
100-12.5 | ORAL_TABLET | ORAL | 3 refills | 90.00000 days | Status: AC
Start: 2022-01-04 — End: 2022-11-24

## 2022-01-04 NOTE — Therapy (Signed)
OUTPATIENT PHYSICAL THERAPY TREATMENT NOTE   Patient Name: Ariana Jefferson MRN: [id number] DOB:May 19, 1960, 61 y.o., female Today's Date: 01/04/2022  PCP: Leim Fabry, MD REFERRING PROVIDER: Gunnar Fusi, PA  END OF SESSION:   PT End of Session - 01/04/22 1716     Visit Number 7    Number of Visits 17    Date for PT Re-Evaluation 02/10/22    Authorization Type [employer] 2023, 60 combined PT/OT/Chiro    Progress Note Due on Visit 10    PT Start Time 1714    PT Stop Time 1758    PT Time Calculation (min) 44 min    Activity Tolerance Patient tolerated treatment well;Patient limited by pain    Behavior During Therapy WFL for tasks assessed/performed                 Past Medical History:  Diagnosis Date   High cholesterol    Past Surgical History:  Procedure Laterality Date   CESAREAN SECTION     CHOLECYSTECTOMY     KNEE SURGERY     LAPAROSCOPIC GASTRIC SLEEVE RESECTION     Patient Active Problem List   Diagnosis Date Noted   Hx of skin cancer, basal cell 02/07/2018   Obesity 02/07/2018   High cholesterol 12/11/2016   Low vitamin B12 level 12/11/2016   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   Lung nodule seen on imaging study 11/13/2014   DJD (degenerative joint disease) 05/14/2013    REFERRING DIAG:  X09.407 (ICD-10-CM) - S/P arthroscopy of left shoulder  Z01.818 (ICD-10-CM) - Encounter for other preprocedural examination    THERAPY DIAG:  Acute pain of left shoulder  Stiffness of left shoulder, not elsewhere classified  Muscle weakness (generalized)  Rationale for Evaluation and Treatment Rehabilitation  PERTINENT HISTORY: Pt is a 61 year old female s/p L shoulder arthroscopy with biceps tenodesis, resection of adhesions, SAD, DCE, debridement. Patient reports no post-op complications. Pt reports post-op pain is better than she anticipated; pt reports mild pain presently. Patient reports no instructions yet on exercises to do with shoulder. Pt has  weaned from opioid analgesic pain medicine; pt alternates Tylenol and Ibuprofen as needed. Pt denies recent physical trauma. Pt had emergency hemicholectomy earlier this year with good post-op recovery. Pt has been compliant with sling use as directed by her surgeon.    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, 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 (personal history of cancer, chills/fever, night sweats, nausea, vomiting, unrelenting pain):              -hx of basal cell carcinoma, lung nodule on imaging study,  otherwise negative    Living Environment Lives with: lives with their spouse Lives in: House/apartment     Patient Goals: Patient wants to return to golf, being able to remodel her home/paint, able to reach above head     PRECAUTIONS: No active biceps x 6 weeks, no resisted elbow flexion or supination, A/AAROM at 4 weeks post-op   DOS: 11/24/21   SUBJECTIVE:  SUBJECTIVE STATEMENT:  Patient reports frustration with pain in her L shoulder this week. She reports helping with remodeling in her home; no heavy activity with L arm, but pt has done some painting with her L arm. Pt uses R arm for most activity. Patient reports compliance with HEP.     PAIN:  Are you having pain? 1-2/10 pain at arrival    OBJECTIVE: (objective measures completed at initial evaluation unless otherwise dated)     Patient Surveys  FOTO: 72, predicted score of 60   Cognition Patient is oriented to person, place, and time.  Recent memory is intact.  Remote memory is intact.  Attention span and concentration are intact.  Expressive speech is intact.  Patient's fund of knowledge is within normal limits for educational level.                          Gross Musculoskeletal Assessment Tremor: None Bulk: Normal Tone: Normal     Posture Mild forward head, protracted scapulae     Cervical Screen   AROM     AROM (Normal range in degrees) AROM 12/13/2021    Right Left  Shoulder      Flexion   Deferred  Extension   Deferred  Abduction   Deferred  External Rotation   Deferred  Internal Rotation   Deferred  Hands Behind Head   Deferred  Hands Behind Back   Deferred         Elbow      Flexion      Extension      Pronation      Supination      (* = pain; Blank rows = not tested)     PROM     PROM (Normal range in degrees) PROM 12/13/2021    Right Left  Shoulder      Flexion   50  Extension      Abduction      External Rotation   15  Internal Rotation   45  Hands Behind Head      Hands Behind Back             Elbow      Flexion   WNL  Extension   -10  Pronation   WNL  Supination   WNL  (* = pain; Blank rows = not tested)     LE MMT: MMT deferred due to early post-op status* MMT (out of 5) Right 12/13/2021 Left 12/13/2021         Shoulder   Flexion      Extension      Abduction      External rotation      Internal rotation      Horizontal abduction      Horizontal adduction      Lower Trapezius      Rhomboids             Elbow  Flexion      Extension      Pronation      Supination             Wrist  Flexion      Extension      Radial deviation      Ulnar deviation             (* = pain; Blank  rows = not tested)   Sensation Deferred   Reflexes Deferred     Palpation  Location LEFT  RIGHT           Subocciptials      Cervical paraspinals      Upper Trapezius 0    Levator Scapulae      Rhomboid Major/Minor      Sternoclavicular joint 0    Acromioclavicular joint 2    Coracoid process 2    Bicipital groove 2    Supraspinatus 1    Infraspinatus 0    Subscapularis      Teres Minor      Teres Major      Pectoralis Major 1    Pectoralis Minor      Anterior Deltoid 1    Lateral Deltoid 1    Posterior Deltoid 0    Latissimus Dorsi      Sternocleidomastoid      (Blank rows = not tested) Graded on 0-4 scale (0 = no pain, 1 = pain, 2 = pain with wincing/grimacing/flinching, 3 = pain with withdrawal, 4 = unwilling to allow palpation), (Blank rows = not tested)         TODAY'S TREATMENT      Manual Therapy - for R shoulder ROM and to prevent R shoulder stiffness   R shoulder PROM into flexion and ER to neutral, ROM within pt tolerance, gentle oscillations of upper limb during ROM; x 10 minutes R elbow PROM for flexion/extension and pronation/supination; x 1 minutes Gentle STM to R biceps, anterior and middle deltoid, pectoralis major; x 5 minutes     Therapeutic Exercise - for shoulder complex ROM as needed for ability to perform reaching and self-care/ADLs   Upper body ergometer, 4 minutes forward - for tissue warm-up to improve muscle performance, improved soft tissue mobility/extensibility - subjective information gathered during billed time   Wand flexion AAROM; 2x10, 3 sec hold  -elbow flexion for return to neutral Wand ER AAROM, in standing today: 20x, 3 sec Ball roll up wall, yellow physioball; x10  Shoulder isometrics; shoulder flexion and ABD; x10, 5 sec - heavy demonstration and verbal/tactile cueing for technique  -well tolerated today  PATIENT EDUCATION: Discusses strategies for pain control including continuing use of Rx medications as directed  by her surgeon, positioning while lying in bed, and use of cryotherapy at home      *not today* Pulley; flexion, 2 minutes AAROM Pendulums, forward/backward, side-to-side circles CW and CCW; x20 each with well arm propped on edge of treatment table Scapular retraction; reviewed Gripping; pink putty; 2 minutes Wrist AROM, flexion/extension and RD/UD; reviewed     Cold pack (unbilled) - post-treatment; for anti-inflammatory and analgesic effect as needed for reduced pain and improved ability to participate in active PT intervention, along L shoulder in sitting, x 5 minutes      PATIENT EDUCATION:  Education details: see above for patient education details Person educated: Patient Education method: Explanation, Handout Education comprehension: verbalized understanding     HOME EXERCISE PROGRAM: Access Code: [email or web address]      ASSESSMENT:   CLINICAL IMPRESSION:  Patient reports notable pain with ROM of L shoulder at this time. She reports difficulty with getting comfortable at night and getting good quality/quantity of sleep. Pt demonstrates shoulder PROM that is progressing appropriately at this time post-operatively. Pt is continuing with AAROM at home for gradually increasing shoulder motion. Discussed use of cryotherapy at home prn, positioning for improved comfort at night, and use of Rx medications as direct by her physician to improve pain control at this time. Pt  has no significant pain with addition of isometrics. Patient has remaining deficits including: significant L shoulder ROM deficits, local nociceptive L shoulder pain, edema, decreased L shoulder/elbow strength. Patient will benefit from continued skilled therapeutic intervention to address the above deficits as needed for improved function and QoL.     REHAB POTENTIAL: Excellent   CLINICAL DECISION MAKING: Evolving/moderate complexity   EVALUATION COMPLEXITY: Low     GOALS: Goals reviewed with patient? Yes    SHORT TERM GOALS: Target date: 01/10/2022   Pt will be independent with HEP to improve strength and decrease neck pain to improve pain-free function at home and work. Baseline: 12/12/21: Baseline HEP initiated Goal status: INITIAL     LONG TERM GOALS: Target date: 02/07/2022   Pt will increase FOTO to at least 60 to demonstrate significant improvement in function at home and work related to neck pain  Baseline: 12/12/21: 29 Goal status: INITIAL   2.   Pt will have L shoulder AROM at least within 10 degrees of contralateral upper extremity indicative of improved ROM as needed for reaching, overhead work, self-care activities/dressing/grooming Baseline: 12/12/21: NO AROM presently, severely limited PROM in early post-op phase.    Goal status: INITIAL   3. Pt will improve L shoulder strength to at least 4+/5 or greater for all motions measured as needed for improved capacity to perform lifting and stabilize shoulder during working with power tools to complete home improvement projects and swing golf club       Baseline: 12/12/21: No MMTs due to early post-op status Goal status: INITIAL   4. Pt will demonstrate golf swing with sound technique and no reproduction of shoulder pain as needed for return to golfing Baseline: 12/12/21: Unable to complete shoulder AROM or compound motion required for golf swing Goal status: INITIAL     PLAN: PT FREQUENCY: 2x/week   PT DURATION: 8 weeks   PLANNED INTERVENTIONS: Therapeutic exercises, Therapeutic activity, Neuromuscular re-education, Patient/Family education, Joint mobilization, Dry Needling, Electrical stimulation, Cryotherapy, Moist heat, Traction, and Manual therapy   PLAN FOR NEXT SESSION: Continue with shoulder A/AAROM for gradually restoring full ROM, periscapular isotonics, 3-way shoulder isometrics     Consuela Mimes, PT, DPT #[id number]  Gertie Exon, PT 01/04/2022, 5:17 PM

## 2022-01-06 ENCOUNTER — Encounter: Admit: 2022-01-06 | Payer: PRIVATE HEALTH INSURANCE | Attending: Obstetrics and Gynecology | Primary: Internal Medicine

## 2022-01-06 ENCOUNTER — Ambulatory Visit: Admit: 2022-01-06 | Payer: BLUE CROSS/BLUE SHIELD | Attending: Obstetrics and Gynecology | Primary: Internal Medicine

## 2022-01-06 DIAGNOSIS — Z1211 Encounter for screening for malignant neoplasm of colon: Secondary | ICD-10-CM

## 2022-01-06 DIAGNOSIS — K219 Gastro-esophageal reflux disease without esophagitis: Secondary | ICD-10-CM

## 2022-01-06 DIAGNOSIS — Z01419 Encounter for gynecological examination (general) (routine) without abnormal findings: Secondary | ICD-10-CM

## 2022-01-06 DIAGNOSIS — R55 Syncope and collapse: Secondary | ICD-10-CM

## 2022-01-06 DIAGNOSIS — J189 Pneumonia, unspecified organism: Secondary | ICD-10-CM

## 2022-01-06 DIAGNOSIS — F341 Dysthymic disorder: Secondary | ICD-10-CM

## 2022-01-06 DIAGNOSIS — R002 Palpitations: Secondary | ICD-10-CM

## 2022-01-06 DIAGNOSIS — I499 Cardiac arrhythmia, unspecified: Secondary | ICD-10-CM

## 2022-01-06 DIAGNOSIS — F909 Attention-deficit hyperactivity disorder, unspecified type: Secondary | ICD-10-CM

## 2022-01-06 DIAGNOSIS — I10 Essential (primary) hypertension: Secondary | ICD-10-CM

## 2022-01-06 DIAGNOSIS — Z1231 Encounter for screening mammogram for malignant neoplasm of breast: Secondary | ICD-10-CM

## 2022-01-06 DIAGNOSIS — IMO0001 Reflux: Secondary | ICD-10-CM

## 2022-01-06 NOTE — Progress Notes
Kristie Frye is a 61 y.o. G10P2002 female who presents to this practice for an annual exam.  Denies bleeding, discharge, painPatient's last menstrual period was 02/02/2013 (exact date). -only issue is weight Patient History?	Problem List: has Varicose vein; Depression; Anxiety; Acne rosacea; GERD (gastroesophageal reflux disease); HTN (hypertension); Seborrheic dermatitis; Vitamin D deficiency; High cholesterol; Colon cancer screening- 6/21 2 adenoma polyps repeat 3 years dr Camie Patience ; Morbid obesity with BMI of 40.0-44.9, adult (HC Code); and Psoriasis on their problem list. ?	Past Surgical History: has a past surgical history that includes Ablation saphenous vein w/ RFA; Urethral sling; and Laparoscopic colon resection.?	Past Medical History: has a past medical history of ADHD (attention deficit hyperactivity disorder), Arrhythmia, Dysthymia, GERD (gastroesophageal reflux disease), Heart palpitations, Hypertension, Pneumonia, Reflux, and Syncope.?	Family History: family history includes Breast cancer in her paternal aunt; Coronary Artery Disease in her mother; Diabetes in her brother, brother, father, and mother; Heart disease in her mother; Hypertension in her brother and brother; Obesity in her brother and brother; Pulmonary embolism in her mother; Sleep apnea in her brother and brother; Thrombophilia in her mother.?	Allergies:has No Known Allergies. Medications: Current Outpatient Medications: ?  cholecalciferol (vitamin D3), 1,000 Units, Oral, Daily?  clobetasoL, Apply topically 2 (two) times daily.?  dexmethylphenidate, 20 mg, Oral, Q24H?  dexmethylphenidate, TAKE 1 TABLET BY MOUTH TWICE A DAY?  DULoxetine, TAKE 1 CAPSULE BY MOUTH DAILY?  losartan-hydrochlorothiazide, 1 tablet, Oral, Q24H?  metoprolol succinate XL, TAKE 1 TABLET BY MOUTH DAILY WITH OR IMMEDIATELY FOLLOWING A MEAL?  omeprazole, TAKE 1 CAPSULE BY MOUTH DAILY?  Vtama, Apply topically daily.?	? ivermectin, 1 Application , Topical (Top), BID (Patient not taking: Reported on 08/12/2021) ?	Social History: reports that she has never smoked. She has never used smokeless tobacco. She reports that she does not currently use alcohol. She reports that she does not use drugs. ?	Menstrual/Sexual History: widow, wife died 2020/08/10?	Feels safe at home  OB History Gravida Para Term Preterm AB Living 2 2 2  0 0 2 SAB IAB Ectopic Molar Multiple Live Births 0 0 0   0 2  # Outcome Date GA Lbr Len/2nd Weight Sex Delivery Anes PTL Lv 2 Term 2006    F Vag-Spont   LIV 1 Term 08/11/02 Vag-Spont   LIV ROS: negative Objective: ?	BP 124/78  - Ht 5' 8.5 (1.74 m)  - Wt 125.2 kg  - LMP 02/02/2013 (Exact Date)  - BMI 41.36 kg/m?  Physical Exam Neuro - Alert and oriented x3?	Gen - appears well, no distress?	Psych - patient answers all questions appropriately with normal affect?	Neck - normal appearance?	Resp - no increased work of breathing?	Abd - soft, non tender, non distended. No masses palpated?	Musculoskeletal - grossly normal?	Skin - no visible lesions?	BREAST AND PELVIC EXAM:?	Breasts - symmetrical, normal appearing. No masses, lymphadenopathy, or skin changes noted?	 External female genitalia:  without lesions or erythema. ?	?	Urethral meatus: normal in appearance. ?	?	Vagina: No abnormal vaginal discharge, lesions  ?	?	Cervix: without visible lesions, no CMT?	?	Uterus:normal size, shape and consistency?	?	Adnexa: no palpable masses. Non tender?	?	Rectal: no masses, hemoccult negative	?	Health Maintenance: Colonoscopy:?2021 Tubular adenoma F/u 3 yrs?Dexa Bone Density: ?Mammogram:??[place]-[facility] [place]:NWGNF:??[id number] ?Assessment / Plan: Kristie Frye is a 61 y.o. G79P2002 female with concern re weight-discussed GLP-1, contravePap guidelines reviewed?	Patient exam or treatment required medical chaperone.?	The sensitive parts of the examination were performed with chaperone present:  Rose capozziello. MAKim C. Primitivo Gauze, MD

## 2022-01-07 ENCOUNTER — Encounter: Payer: Self-pay | Admitting: Physical Therapy

## 2022-01-09 ENCOUNTER — Encounter: Payer: Self-pay | Admitting: Physical Therapy

## 2022-01-09 ENCOUNTER — Ambulatory Visit: Payer: Managed Care, Other (non HMO) | Admitting: Physical Therapy

## 2022-01-09 DIAGNOSIS — M25512 Pain in left shoulder: Secondary | ICD-10-CM

## 2022-01-09 DIAGNOSIS — M25612 Stiffness of left shoulder, not elsewhere classified: Secondary | ICD-10-CM

## 2022-01-09 DIAGNOSIS — M6281 Muscle weakness (generalized): Secondary | ICD-10-CM

## 2022-01-09 NOTE — Therapy (Signed)
OUTPATIENT PHYSICAL THERAPY TREATMENT NOTE   Patient Name: Ariana Jefferson MRN: [id number] DOB:[birth year], 61 y.o., female Today's Date: 01/10/2022  PCP: Leim Fabry, MD REFERRING PROVIDER: Gunnar Fusi, PA  END OF SESSION:   PT End of Session - 01/09/22 1720     Visit Number 8    Number of Visits 17    Date for PT Re-Evaluation 02/10/22    Authorization Type [employer] 2023, 60 combined PT/OT/Chiro    Progress Note Due on Visit 10    PT Start Time 1715    PT Stop Time 1805    PT Time Calculation (min) 50 min    Activity Tolerance Patient tolerated treatment well;Patient limited by pain    Behavior During Therapy WFL for tasks assessed/performed                  Past Medical History:  Diagnosis Date   High cholesterol    Past Surgical History:  Procedure Laterality Date   CESAREAN SECTION     CHOLECYSTECTOMY     KNEE SURGERY     LAPAROSCOPIC GASTRIC SLEEVE RESECTION     Patient Active Problem List   Diagnosis Date Noted   Hx of skin cancer, basal cell 02/07/2018   Obesity 02/07/2018   High cholesterol 12/11/2016   Low vitamin B12 level 12/11/2016   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   Lung nodule seen on imaging study 11/13/2014   DJD (degenerative joint disease) 05/14/2013    REFERRING DIAG:  J57.017 (ICD-10-CM) - S/P arthroscopy of left shoulder  Z01.818 (ICD-10-CM) - Encounter for other preprocedural examination    THERAPY DIAG:  Acute pain of left shoulder  Stiffness of left shoulder, not elsewhere classified  Muscle weakness (generalized)  Rationale for Evaluation and Treatment Rehabilitation  PERTINENT HISTORY: Pt is a 61 year old female s/p L shoulder arthroscopy with biceps tenodesis, resection of adhesions, SAD, DCE, debridement. Patient reports no post-op complications. Pt reports post-op pain is better than she anticipated; pt reports mild pain presently. Patient reports no instructions yet on exercises to do with shoulder. Pt  has weaned from opioid analgesic pain medicine; pt alternates Tylenol and Ibuprofen as needed. Pt denies recent physical trauma. Pt had emergency hemicholectomy earlier this year with good post-op recovery. Pt has been compliant with sling use as directed by her surgeon.    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, 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 (personal history of cancer, chills/fever, night sweats, nausea, vomiting, unrelenting pain):              -hx of basal cell carcinoma, lung nodule on imaging study,  otherwise negative    Living Environment Lives with: lives with their spouse Lives in: House/apartment     Patient Goals: Patient wants to return to golf, being able to remodel her home/paint, able to reach above head     PRECAUTIONS: No active biceps x 6 weeks, no resisted elbow flexion or supination, A/AAROM at 4 weeks post-op   DOS: 11/24/21   SUBJECTIVE:  SUBJECTIVE STATEMENT:  Patient reports doing better tonight than last week. Patient reports improving sleep quality at this time. She is using meloxicam and muscle relaxer at night as prescribed by her surgeon for symptom management. Patient reports compliance with HEP.    PAIN:   Are you having pain? No    OBJECTIVE: (objective measures completed at initial evaluation unless otherwise dated)     Patient Surveys  FOTO: 44, predicted score of 60   Cognition Patient is oriented to person, place, and time.  Recent memory is intact.  Remote memory is intact.  Attention span and concentration are intact.  Expressive speech is intact.  Patient's fund of knowledge is within normal limits for educational level.                          Gross Musculoskeletal Assessment Tremor: None Bulk: Normal Tone: Normal     Posture Mild forward head, protracted scapulae     Cervical Screen   AROM     AROM (Normal range in degrees) AROM 12/13/2021    Right Left  Shoulder      Flexion   Deferred  Extension   Deferred  Abduction   Deferred  External Rotation   Deferred  Internal Rotation   Deferred  Hands Behind Head   Deferred  Hands Behind Back   Deferred         Elbow      Flexion      Extension      Pronation      Supination      (* = pain; Blank rows = not tested)     PROM     PROM (Normal range in degrees) PROM 12/13/2021    Right Left  Shoulder      Flexion   50  Extension      Abduction      External Rotation   15  Internal Rotation   45  Hands Behind Head      Hands Behind Back             Elbow      Flexion   WNL  Extension   -10  Pronation   WNL  Supination   WNL  (* = pain; Blank rows = not tested)     LE MMT: MMT deferred due to early post-op status* MMT (out of 5) Right 12/13/2021 Left 12/13/2021         Shoulder   Flexion      Extension      Abduction      External rotation      Internal rotation      Horizontal abduction      Horizontal adduction      Lower Trapezius      Rhomboids             Elbow  Flexion      Extension      Pronation      Supination             Wrist  Flexion      Extension      Radial deviation      Ulnar deviation             (* = pain; Blank rows = not tested)    Sensation Deferred   Reflexes Deferred     Palpation   Location LEFT  RIGHT  Subocciptials      Cervical paraspinals      Upper Trapezius 0    Levator Scapulae      Rhomboid Major/Minor      Sternoclavicular joint 0    Acromioclavicular joint 2    Coracoid process 2    Bicipital groove 2    Supraspinatus 1    Infraspinatus 0    Subscapularis      Teres Minor      Teres Major      Pectoralis Major 1    Pectoralis Minor      Anterior Deltoid 1    Lateral Deltoid 1    Posterior Deltoid 0    Latissimus Dorsi      Sternocleidomastoid      (Blank rows = not tested) Graded on 0-4 scale (0 = no pain, 1 = pain, 2 = pain with wincing/grimacing/flinching, 3 = pain with withdrawal, 4 = unwilling to allow palpation), (Blank rows = not tested)         TODAY'S TREATMENT      Manual Therapy - for R shoulder ROM and to prevent R shoulder stiffness   L shoulder PROM into flexion and ER to neutral, ROM within pt tolerance, gentle oscillations of upper limb during ROM; x 10 minutes L elbow PROM for flexion/extension and pronation/supination; x 1 minutes Gentle STM to L biceps, anterior and middle deltoid, pectoralis major; x 3 minutes     Therapeutic Exercise - for shoulder complex ROM as needed for ability to perform reaching and self-care/ADLs   Upper body ergometer, 4 minutes forward - for tissue warm-up to improve muscle performance, improved soft tissue mobility/extensibility - subjective information gathered during billed time   Attempted wand supine shoulder abduction; unable to complete with L arm discomfort Wand ER AAROM, in standing with back to wall today: 20x, 3 sec Ball roll up wall, yellow physioball; x5 Swiss ball roll across table, shoulder abduction; x15, Green physioball  Shoulder isometrics; shoulder flexion, ABD, and extension; x10 each direction, 5 sec - heavy demonstration and verbal/tactile cueing for technique  -well tolerated today  PATIENT  EDUCATION: Discusses strategies for pain control including continuing use of Rx medications as directed by her surgeon, positioning while lying in bed, and use of cryotherapy at home      *not today* Pulley; flexion, 2 minutes AAROM Pendulums, forward/backward, side-to-side circles CW and CCW; x20 each with well arm propped on edge of treatment table Scapular retraction; reviewed Gripping; pink putty; 2 minutes Wrist AROM, flexion/extension and RD/UD; reviewed     Cold pack (unbilled) - post-treatment; for anti-inflammatory and analgesic effect as needed for reduced pain and improved ability to participate in active PT intervention, along L shoulder in sitting, x 5 minutes      PATIENT EDUCATION:  Education details: see above for patient education details Person educated: Patient Education method: Explanation, Handout Education comprehension: verbalized understanding     HOME EXERCISE PROGRAM: Access Code: [email or web address]      ASSESSMENT:   CLINICAL IMPRESSION:  Patient reports improved pain control and improved ability to get sleep. Pt demonstrates WFL L shoulder flexion PROM. She has intermittent discomfort with return to neutral from flexion and abduction. Pt tolerates isometrics well into flexion and abduction - pt does have intermittent pain with extension isometric that is improved with modification of force exerted into towel on wall. Patient has remaining deficits including: significant L shoulder ROM deficits, local nociceptive L shoulder pain, edema, decreased L shoulder/elbow strength. Patient  will benefit from continued skilled therapeutic intervention to address the above deficits as needed for improved function and QoL.     REHAB POTENTIAL: Excellent   CLINICAL DECISION MAKING: Evolving/moderate complexity   EVALUATION COMPLEXITY: Low     GOALS: Goals reviewed with patient? Yes   SHORT TERM GOALS: Target date: 01/10/2022   Pt will be independent with HEP to  improve strength and decrease neck pain to improve pain-free function at home and work. Baseline: 12/12/21: Baseline HEP initiated Goal status: INITIAL     LONG TERM GOALS: Target date: 02/07/2022   Pt will increase FOTO to at least 60 to demonstrate significant improvement in function at home and work related to neck pain  Baseline: 12/12/21: 29 Goal status: INITIAL   2.   Pt will have L shoulder AROM at least within 10 degrees of contralateral upper extremity indicative of improved ROM as needed for reaching, overhead work, self-care activities/dressing/grooming Baseline: 12/12/21: NO AROM presently, severely limited PROM in early post-op phase.    Goal status: INITIAL   3. Pt will improve L shoulder strength to at least 4+/5 or greater for all motions measured as needed for improved capacity to perform lifting and stabilize shoulder during working with power tools to complete home improvement projects and swing golf club       Baseline: 12/12/21: No MMTs due to early post-op status Goal status: INITIAL   4. Pt will demonstrate golf swing with sound technique and no reproduction of shoulder pain as needed for return to golfing Baseline: 12/12/21: Unable to complete shoulder AROM or compound motion required for golf swing Goal status: INITIAL     PLAN: PT FREQUENCY: 2x/week   PT DURATION: 8 weeks   PLANNED INTERVENTIONS: Therapeutic exercises, Therapeutic activity, Neuromuscular re-education, Patient/Family education, Joint mobilization, Dry Needling, Electrical stimulation, Cryotherapy, Moist heat, Traction, and Manual therapy   PLAN FOR NEXT SESSION: Continue with shoulder A/AAROM for gradually restoring full ROM, periscapular isotonics, 3-way shoulder isometrics     Consuela MimesJeremy Choya Tornow, PT, DPT #[id number]  Gertie ExonJeremy T Carynn Felling, PT 01/10/2022, 2:59 PM

## 2022-01-10 ENCOUNTER — Encounter
Admit: 2022-01-10 | Payer: PRIVATE HEALTH INSURANCE | Attending: Vascular and Interventional Radiology | Primary: Internal Medicine

## 2022-01-11 ENCOUNTER — Ambulatory Visit: Payer: Managed Care, Other (non HMO)

## 2022-01-11 DIAGNOSIS — M25512 Pain in left shoulder: Secondary | ICD-10-CM | POA: Diagnosis not present

## 2022-01-11 DIAGNOSIS — M6281 Muscle weakness (generalized): Secondary | ICD-10-CM

## 2022-01-11 DIAGNOSIS — M25612 Stiffness of left shoulder, not elsewhere classified: Secondary | ICD-10-CM

## 2022-01-11 NOTE — Therapy (Signed)
OUTPATIENT PHYSICAL THERAPY TREATMENT NOTE   Patient Name: Ariana Jefferson MRN: [id number] DOB:07/03/1960, 61 y.o., female Today's Date: 01/11/2022  PCP: Leim Fabry, MD REFERRING PROVIDER: Gunnar Fusi, PA  END OF SESSION:   PT End of Session - 01/11/22 1723     Visit Number 9    Number of Visits 17    Date for PT Re-Evaluation 02/10/22    Authorization Type [employer] 2023, 60 combined PT/OT/Chiro    Authorization Time Period 12/12/21-02/10/22    Progress Note Due on Visit 10    PT Start Time 1720    PT Stop Time 1800    PT Time Calculation (min) 40 min    Activity Tolerance Patient tolerated treatment well;Patient limited by pain    Behavior During Therapy [facility] for tasks assessed/performed                  Past Medical History:  Diagnosis Date   High cholesterol    Past Surgical History:  Procedure Laterality Date   CESAREAN SECTION     CHOLECYSTECTOMY     KNEE SURGERY     LAPAROSCOPIC GASTRIC SLEEVE RESECTION     Patient Active Problem List   Diagnosis Date Noted   Hx of skin cancer, basal cell 02/07/2018   Obesity 02/07/2018   High cholesterol 12/11/2016   Low vitamin B12 level 12/11/2016   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   Lung nodule seen on imaging study 11/13/2014   DJD (degenerative joint disease) 05/14/2013    REFERRING DIAG:  X09.407 (ICD-10-CM) - S/P arthroscopy of left shoulder  Z01.818 (ICD-10-CM) - Encounter for other preprocedural examination    THERAPY DIAG:  Acute pain of left shoulder  Stiffness of left shoulder, not elsewhere classified  Muscle weakness (generalized)  Rationale for Evaluation and Treatment Rehabilitation  PERTINENT HISTORY: Pt is a 61 year old female s/p L shoulder arthroscopy with biceps tenodesis, resection of adhesions, SAD, DCE, debridement. Patient reports no post-op complications. Pt reports post-op pain is better than she anticipated; pt reports mild pain presently. Patient reports no  instructions yet on exercises to do with shoulder. Pt has weaned from opioid analgesic pain medicine; pt alternates Tylenol and Ibuprofen as needed. Pt denies recent physical trauma. Pt had emergency hemicholectomy earlier this year with good post-op recovery. Pt has been compliant with sling use as directed by her surgeon.       Patient Goals: Patient wants to return to golf, being able to remodel her home/paint, able to reach above head     PRECAUTIONS: No active biceps x 6 weeks, no resisted elbow flexion or supination, A/AAROM at 4 weeks post-op   DOS: 11/24/21   SUBJECTIVE:  SUBJECTIVE STATEMENT:  HEP updates going well. Pain remains generally well controlled. Now 30 days into PT cert, pt feels gradual progress is being made, however she admits to feeling impatient.    PAIN:  Are you having pain? Yes ~1/10 Left shoulder and into her entire brachium with certain stretches at home       TODAY'S TREATMENT 01/11/22   -FOTO Survey -flexion table slides x15 (slight scaption)  -Shoulder adduction axial towel roll 12x3secH  -scapular retraction 12x3secH  -Left GHJ ER table slide stretch 2x30sec  -Left GHKJ ABDCT table slide 1x12x3secH -isometric standing Left shoulder extension, pronated hand into towel (terminal elbow extension_ 12x3sec H -supine Left AArom long lever flexion 25-90 degrees with isometric hold at 90 degrees 8x10sec -Left GHJ lateral distraction  2x30sec with towel with GHJ neutral (elbow extended and pronated)    PATIENT EDUCATION: reminded to avoid elbow flexion and supination actively; discussed potential pronated elbow flexion as an alternative strategy for EMERGENCY SITUATIONS ONLY.     PATIENT EDUCATION:  Education details: see above for patient education details Person educated:  Patient Education method: Explanation, Handout Education comprehension: verbalized understanding     HOME EXERCISE PROGRAM: Access Code: [email or web address]      ASSESSMENT:   CLINICAL IMPRESSION: HEP good. Pain generally well controlled however pt has been busy today and did not have a chance to take pain med prior to session. Educated on a potential 2nd option for AA/ROM for GHJ ER at home which in clinic is perceived as better controlled- objectively appears to achieve biceps restrictions with greater success. Generally good tolerance to session today, mild ashiness without ado regarding pain at end of session. Patient will benefit from continued skilled therapeutic intervention to address the above deficits as needed for improved function and QoL.     REHAB POTENTIAL: Excellent   CLINICAL DECISION MAKING: Evolving/moderate complexity   EVALUATION COMPLEXITY: Low     GOALS: Goals reviewed with patient? Yes   SHORT TERM GOALS: Target date: 01/10/2022   Pt will be independent with HEP to improve strength and decrease neck pain to improve pain-free function at home and work. Baseline: 12/12/21: Baseline HEP initiated; 01/11/22: HEP recently updated  Goal status: Achieved;      LONG TERM GOALS: Target date: 02/07/2022   Pt will increase FOTO to at least 60 to demonstrate significant improvement in function at home and work related to neck pain  Baseline: 12/12/21: 29; 01/11/22: 53%  Goal status: making progress   2.   Pt will have L shoulder AROM at least within 10 degrees of contralateral upper extremity indicative of improved ROM as needed for reaching, overhead work, self-care activities/dressing/grooming Baseline: 12/12/21: NO AROM presently, severely limited PROM in early post-op phase.    Goal status: deferred    3. Pt will improve L shoulder strength to at least 4+/5 or greater for all motions measured as needed for improved capacity to perform lifting and stabilize shoulder  during working with power tools to complete home improvement projects and swing golf club       Baseline: 12/12/21: No MMTs due to early post-op status Goal status: deferred   4. Pt will demonstrate golf swing with sound technique and no reproduction of shoulder pain as needed for return to golfing Baseline: 12/12/21: Unable to complete shoulder AROM or compound motion required for golf swing Goal status: deferred     PLAN: PT FREQUENCY: 2x/week   PT DURATION: 8 weeks   PLANNED INTERVENTIONS: Therapeutic exercises,  Therapeutic activity, Neuromuscular re-education, Patient/Family education, Joint mobilization, Dry Needling, Electrical stimulation, Cryotherapy, Moist heat, Traction, and Manual therapy   PLAN FOR NEXT SESSION: Continue with shoulder A/AAROM for gradually restoring full ROM, periscapular isotonics, 3-way shoulder isometrics  5:33 PM, 01/11/22 Rosamaria Lints, PT, DPT Physical Therapist - [facility] in [place]  [phone] (Office)     Xenia C, PT 01/11/2022, 5:26 PM

## 2022-01-16 ENCOUNTER — Ambulatory Visit: Payer: Managed Care, Other (non HMO) | Admitting: Physical Therapy

## 2022-01-18 ENCOUNTER — Encounter: Payer: Managed Care, Other (non HMO) | Admitting: Physical Therapy

## 2022-01-19 ENCOUNTER — Encounter: Admit: 2022-01-19 | Payer: PRIVATE HEALTH INSURANCE | Attending: Internal Medicine | Primary: Internal Medicine

## 2022-01-19 MED ORDER — DULOXETINE 60 MG CAPSULE,DELAYED RELEASE
60 | ORAL_CAPSULE | ORAL | 3 refills | 45.00000 days | Status: AC
Start: 2022-01-19 — End: 2022-11-02

## 2022-01-19 NOTE — Telephone Encounter
I spoke to Eve from [employer]. Eve stated that she didn't see an appeal for Vtama Cream on file. Eve stated that appeal has to be faxed physically and not electronically. Eve provided fax number of [phone] to which I faxed Appeal to successfully. I will push due date out 10 days to check on status of appeal.Hatem Cull, CPHTMedication Management Clinic [facility] [address], [place] [address]G: [phone]: [phone]

## 2022-01-24 ENCOUNTER — Encounter: Admit: 2022-01-24 | Payer: PRIVATE HEALTH INSURANCE | Attending: Internal Medicine | Primary: Internal Medicine

## 2022-01-24 MED ORDER — DEXMETHYLPHENIDATE ER 20 MG CAPSULE,EXTENDED RELEASE BIPHASIC50-50
20 | ORAL_CAPSULE | ORAL | 1 refills | 30.00000 days | Status: AC
Start: 2022-01-24 — End: 2022-02-24

## 2022-01-27 ENCOUNTER — Ambulatory Visit: Admit: 2022-01-27 | Payer: BLUE CROSS/BLUE SHIELD | Attending: Internal Medicine | Primary: Internal Medicine

## 2022-01-27 MED ORDER — FLUOCINOLONE ACETONIDE OIL 0.01 % EAR DROPS
0.01 | TOPICAL | 1.00 refills | 30.00000 days | Status: AC
Start: 2022-01-27 — End: 2022-01-27

## 2022-01-27 MED ORDER — METRONIDAZOLE 0.75 % TOPICAL GEL
0.75 | TOPICAL | 1.00 refills | 30.00000 days | Status: AC
Start: 2022-01-27 — End: 2022-11-27

## 2022-01-27 MED ORDER — KETOCONAZOLE 2 % TOPICAL CREAM
2 | TOPICAL | 3.00 refills | 30.00000 days | Status: AC
Start: 2022-01-27 — End: 2022-11-03

## 2022-01-27 MED ORDER — DEXMETHYLPHENIDATE 10 MG TABLET
10 | ORAL_TABLET | Freq: Two times a day (BID) | ORAL | 1 refills | 30.00000 days | Status: AC
Start: 2022-01-27 — End: 2022-04-19

## 2022-01-27 MED ORDER — KETOCONAZOLE 2 % SHAMPOO
2 | TOPICAL | 3.00 refills | 30.00000 days | Status: AC
Start: 2022-01-27 — End: ?

## 2022-01-30 ENCOUNTER — Encounter: Payer: Self-pay | Admitting: Physical Therapy

## 2022-01-30 ENCOUNTER — Ambulatory Visit: Payer: Managed Care, Other (non HMO) | Attending: Physician Assistant | Admitting: Physical Therapy

## 2022-01-30 DIAGNOSIS — M25612 Stiffness of left shoulder, not elsewhere classified: Secondary | ICD-10-CM | POA: Diagnosis present

## 2022-01-30 DIAGNOSIS — M25512 Pain in left shoulder: Secondary | ICD-10-CM | POA: Diagnosis present

## 2022-01-30 DIAGNOSIS — M6281 Muscle weakness (generalized): Secondary | ICD-10-CM | POA: Insufficient documentation

## 2022-01-30 NOTE — Therapy (Signed)
OUTPATIENT PHYSICAL THERAPY TREATMENT AND PROGRESS NOTE/RE-CERTIFICATION   Dates of reporting period  01/11/22   to   01/30/22    Patient Name: Ariana Jefferson MRN: [id number] DOB:05/18/1960, 61 y.o., female Today's Date: 01/30/2022  PCP: Leim FabryBarbara Aldridge, MD REFERRING PROVIDER: Gunnar FusiMiranda Butler, PA  END OF SESSION:   PT End of Session - 01/30/22 1805     Visit Number 10    Number of Visits 17    Date for PT Re-Evaluation 02/10/22    Authorization Type [employer] 2023, 60 combined PT/OT/Chiro    Authorization Time Period 12/12/21-02/10/22    Progress Note Due on Visit 10    PT Start Time 1802    PT Stop Time 1845    PT Time Calculation (min) 43 min    Activity Tolerance Patient tolerated treatment well;Patient limited by pain    Behavior [facility] for tasks assessed/performed              Past Medical History:  Diagnosis Date   High cholesterol    Past Surgical History:  Procedure Laterality Date   CESAREAN SECTION     CHOLECYSTECTOMY     KNEE SURGERY     LAPAROSCOPIC GASTRIC SLEEVE RESECTION     Patient Active Problem List   Diagnosis Date Noted   Hx of skin cancer, basal cell 02/07/2018   Obesity 02/07/2018   High cholesterol 12/11/2016   Low vitamin B12 level 12/11/2016   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   Lung nodule seen on imaging study 11/13/2014   DJD (degenerative joint disease) 05/14/2013    REFERRING DIAG:  N82.956Z98.890 (ICD-10-CM) - S/P arthroscopy of left shoulder  Z01.818 (ICD-10-CM) - Encounter for other preprocedural examination    THERAPY DIAG:  Acute pain of left shoulder  Stiffness of left shoulder, not elsewhere classified  Muscle weakness (generalized)  Rationale for Evaluation and Treatment Rehabilitation  PERTINENT HISTORY: Pt is a 61 year old female s/p L shoulder arthroscopy with biceps tenodesis, resection of adhesions, SAD, DCE, debridement. Patient reports no post-op complications. Pt reports post-op pain is  better than she anticipated; pt reports mild pain presently. Patient reports no instructions yet on exercises to do with shoulder. Pt has weaned from opioid analgesic pain medicine; pt alternates Tylenol and Ibuprofen as needed. Pt denies recent physical trauma. Pt had emergency hemicholectomy earlier this year with good post-op recovery. Pt has been compliant with sling use as directed by her surgeon.       Patient Goals: Patient wants to return to golf, being able to remodel her home/paint, able to reach above head     PRECAUTIONS: No active biceps x 6 weeks, no resisted elbow flexion or supination, A/AAROM at 4 weeks post-op   DOS: 11/24/21   SUBJECTIVE:  SUBJECTIVE STATEMENT:  Patient feels she is making fair progress. Patient reports up until today, she has experienced very nominal pain in her L shoulder; pt is not using medications often for pain at this point. 40-45% SANE score at this time. Patient still does not have access to full overhead range. Patient is not yet able to load surgical shoulder. Pt needs improved shoulder abduction and ability to reach behind her back. Pt reports difficulty with pulling up pants. Pt reports intermittent aching in her shoulder.    PAIN:  Are you having pain? No   OBJECTIVE: (objective measures completed at initial evaluation unless otherwise dated)   Patient Surveys  Eval: FOTO: 29, predicted score of 60   Posture Mild forward head, protracted scapulae   AROM          AROM (Normal range in degrees) AROM 12/13/2021 AROM 01/30/22    Right Left Right Left  Shoulder        Flexion   Deferred WNL 127  Extension   Deferred WNL 72  Abduction   Deferred WNL 133  External Rotation   Deferred WNL 40  Internal Rotation   Deferred WNL 68  Hands Behind Head   Deferred     Hands Behind Back   Deferred             Elbow        Flexion        Extension        Pronation        Supination        (* = pain; Blank rows = not tested)     PROM          PROM (Normal range in degrees) PROM 12/13/2021 PROM 01/30/22 PROM 01/30/22    Right Left Right Left  Shoulder        Flexion   50 WNL 145  Extension        Abduction      110  External Rotation   15 WNL 50  Internal Rotation   45 WNL WNL  Hands Behind Head        Hands Behind Back                 Elbow        Flexion   WNL  WNL  Extension   -10  0  Pronation   WNL  WNL  Supination   WNL  WNL  (* = pain; Blank rows = not tested)     LE MMT: MMT (out of 5) Right 12/13/2021 Left 12/13/2021 Right 01/30/22 Left 01/30/22           Shoulder     Flexion     5 4  Extension        Abduction     5 4  External rotation     5 4-  Internal rotation     5 4+  Horizontal abduction        Horizontal adduction        Lower Trapezius        Rhomboids                 Elbow    Flexion     5 4  Extension     5 4  Pronation        Supination                 Wrist  Flexion        Extension        Radial deviation        Ulnar deviation                 (* = pain; Blank rows = not tested)    Palpation   Location LEFT  RIGHT           Subocciptials      Cervical paraspinals      Upper Trapezius 0    Levator Scapulae      Rhomboid Major/Minor      Sternoclavicular joint 0    Acromioclavicular joint 2    Coracoid process 2    Bicipital groove 2    Supraspinatus 1    Infraspinatus 0    Subscapularis      Teres Minor      Teres Major      Pectoralis Major 1    Pectoralis Minor      Anterior Deltoid 1    Lateral Deltoid 1    Posterior Deltoid 0    Latissimus Dorsi      Sternocleidomastoid      (Blank rows = not tested) Graded on 0-4 scale (0 = no pain, 1 = pain, 2 = pain with wincing/grimacing/flinching, 3 = pain with withdrawal, 4 = unwilling to allow palpation), (Blank rows = not  tested)            TODAY'S TREATMENT 01/30/22    Manual Therapy - for R shoulder ROM and to prevent R shoulder stiffness   L shoulder PROM into flexion, abduction, ER and IR as tolerated by patient with gentle overpressure into end-ROM; x 10 minutes L elbow PROM for flexion/extension and pronation/supination; x 1 minutes Gentle GHJ mobilizations at gr I-II, inferior and A-P for 2x30 sec eeach Gentle STM to L biceps, anterior and middle deltoid; x 2 minutes     Therapeutic Exercise - for shoulder complex ROM as needed for ability to perform reaching and self-care/ADLs   GOAL UPDATE PERFORMED  Upper body ergometer, 2 minutes forward, 2 minutes backward - for tissue warm-up to improve muscle performance, improved soft tissue mobility/extensibility - subjective information gathered during this billed time   Wand flexion AAROM in supine; 2x10 Sidelying shoulder abduction AROM; 1x10 with surgical UE Wand ER AAROM, with elbow propped to 90 deg on table; 2x10   *next visit* Shoulder isometrics; shoulder flexion, ABD, and extension; x10 each direction, 5 sec - heavy demonstration and verbal/tactile cueing for technique             PATIENT EDUCATION: Discussed current progress in PT, expectations moving forward with therapy, continued plan of care and prognosis. Updated and reviewed HEP.          *not today* Ball roll up wall, yellow physioball; x5 Swiss ball roll across table, shoulder abduction; x15, Green physioball  Pulley; flexion, 2 minutes AAROM Pendulums, forward/backward, side-to-side circles CW and CCW; x20 each with well arm propped on edge of treatment table Scapular retraction; reviewed Gripping; pink putty; 2 minutes Wrist AROM, flexion/extension and RD/UD; reviewed        PATIENT EDUCATION:  Education details: see above for patient education details Person educated: Patient Education method: Explanation, Handout Education comprehension: verbalized  understanding     HOME EXERCISE PROGRAM: Access Code: [email or web address]      ASSESSMENT:   CLINICAL IMPRESSION: Data from previous visit carried forward for FOTO given update on visit # 9 (last  visit). Other long-term goal were formally updated today with patient demonstrating fair L upper limb strength and improved ROM versus previous measures taken. Pt does need continued work on restoration of L shoulder active and passive ROM with subsequent transition to strengthening phase of rehab. Pt did have significant difficulty with pain control when first weaning from her sling; this is much improved at this time, though discomfort is experienced with end-ROM into ER and shoulder elevation. Patient is not yet ready for power production, large HABD and ER ROM required, and rapid motion of golf swing; thus, this was deferred for today's goal update. Pt has remaining deficits in L shoulder strength (deltoid, RTC, periscapular mm), active and passive ROM, L shoulder stiffness, postural changes, and anterior shoulder/biceps pain intermittently with ROM work. Patient will benefit from continued skilled therapeutic intervention to address the above deficits as needed for improved function and QoL.     REHAB POTENTIAL: Excellent   CLINICAL DECISION MAKING: Evolving/moderate complexity   EVALUATION COMPLEXITY: Low     GOALS: Goals reviewed with patient? Yes   SHORT TERM GOALS: Target date: 01/10/2022   Pt will be independent with HEP to improve strength and decrease neck pain to improve pain-free function at home and work. Baseline: 12/12/21: Baseline HEP initiated; 01/11/22: HEP recently updated  Goal status: Achieved;      LONG TERM GOALS: Target date: 02/07/2022   Pt will increase FOTO to at least 60 to demonstrate significant improvement in function at home and work related to neck pain  Baseline: 12/12/21: 29/60   01/11/22: 53/60  Goal status: IN PROGRESS   2.   Pt will have L shoulder AROM at  least within 10 degrees of contralateral upper extremity indicative of improved ROM as needed for reaching, overhead work, self-care activities/dressing/grooming Baseline: 12/12/21: NO AROM presently, severely limited PROM in early post-op phase.   01/30/22: Shoulder IR WNL; pt is limited in all other planes of motion at this time Goal status: ON-GOING   3. Pt will improve L shoulder strength to at least 4+/5 or greater for all motions measured as needed for improved capacity to perform lifting and stabilize shoulder during working with power tools to complete home improvement projects and swing golf club       Baseline: 12/12/21: No MMTs due to early post-op status   01/30/22: MMT 4- to 4+ Goal status: ON-GOING   4. Pt will demonstrate golf swing with sound technique and no reproduction of shoulder pain as needed for return to golfing Baseline: 12/12/21: Unable to complete shoulder AROM or compound motion required for golf swing   01/30/22: Deferred Goal status: DEFERRED     PLAN: PT FREQUENCY: 2x/week   PT DURATION: 8 weeks   PLANNED INTERVENTIONS: Therapeutic exercises, Therapeutic activity, Neuromuscular re-education, Patient/Family education, Joint mobilization, Dry Needling, Electrical stimulation, Cryotherapy, Moist heat, Traction, and Manual therapy   PLAN FOR NEXT SESSION: Continue with shoulder A/AAROM for gradually restoring full ROM, periscapular isotonics, 3-way shoulder isometrics. Recommend continued PT 2x/week for 4-6 weeks for restoration of motion and then to regain normal strength.    Consuela Mimes, PT, DPT #[id number]  Ariana Jefferson, PT 01/30/2022, 6:06 PM

## 2022-01-31 NOTE — Telephone Encounter
I spoke to [employer]. They stated that the appeal for V[employer] Cream was received and is still in processing. I will push due date out 10 days to check on status of appeal.Duanne Duchesne [facility],  CPHTMedication Management Clinic Technician	[facility] Main Phone: [phone] AM12/01/2022

## 2022-02-01 ENCOUNTER — Ambulatory Visit: Payer: Managed Care, Other (non HMO) | Admitting: Physical Therapy

## 2022-02-01 DIAGNOSIS — M25612 Stiffness of left shoulder, not elsewhere classified: Secondary | ICD-10-CM

## 2022-02-01 DIAGNOSIS — M6281 Muscle weakness (generalized): Secondary | ICD-10-CM

## 2022-02-01 DIAGNOSIS — M25512 Pain in left shoulder: Secondary | ICD-10-CM

## 2022-02-01 NOTE — Therapy (Signed)
OUTPATIENT PHYSICAL THERAPY TREATMENT   Patient Name: Ariana Jefferson MRN: [id number] DOB:10/23/1960, 61 y.o., female Today's Date: 02/01/2022  PCP: Leim FabryBarbara Aldridge, MD REFERRING PROVIDER: Gunnar FusiMiranda Butler, PA  END OF SESSION:   PT End of Session - 02/01/22 1719     Visit Number 11    Number of Visits 17    Date for PT Re-Evaluation 02/10/22    Authorization Type [employer] 2023, 60 combined PT/OT/Chiro    Authorization Time Period 12/12/21-02/10/22    Progress Note Due on Visit 10    PT Start Time 1713    PT Stop Time 1802    PT Time Calculation (min) 49 min    Activity Tolerance Patient tolerated treatment well;Patient limited by pain    Behavior During Therapy WFL for tasks assessed/performed               Past Medical History:  Diagnosis Date   High cholesterol    Past Surgical History:  Procedure Laterality Date   CESAREAN SECTION     CHOLECYSTECTOMY     KNEE SURGERY     LAPAROSCOPIC GASTRIC SLEEVE RESECTION     Patient Active Problem List   Diagnosis Date Noted   Hx of skin cancer, basal cell 02/07/2018   Obesity 02/07/2018   High cholesterol 12/11/2016   Low vitamin B12 level 12/11/2016   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   Lung nodule seen on imaging study 11/13/2014   DJD (degenerative joint disease) 05/14/2013    REFERRING DIAG:  G29.528Z98.890 (ICD-10-CM) - S/P arthroscopy of left shoulder  Z01.818 (ICD-10-CM) - Encounter for other preprocedural examination    THERAPY DIAG:  Acute pain of left shoulder  Stiffness of left shoulder, not elsewhere classified  Muscle weakness (generalized)  Rationale for Evaluation and Treatment Rehabilitation  PERTINENT HISTORY: Pt is a 61 year old female s/p L shoulder arthroscopy with biceps tenodesis, resection of adhesions, SAD, DCE, debridement. Patient reports no post-op complications. Pt reports post-op pain is better than she anticipated; pt reports mild pain presently. Patient reports no instructions yet  on exercises to do with shoulder. Pt has weaned from opioid analgesic pain medicine; pt alternates Tylenol and Ibuprofen as needed. Pt denies recent physical trauma. Pt had emergency hemicholectomy earlier this year with good post-op recovery. Pt has been compliant with sling use as directed by her surgeon.       Patient Goals: Patient wants to return to golf, being able to remodel her home/paint, able to reach above head     PRECAUTIONS: No active biceps x 6 weeks, no resisted elbow flexion or supination, A/AAROM at 4 weeks post-op   DOS: 11/24/21   SUBJECTIVE:  SUBJECTIVE STATEMENT:  Patient feels tolerating last session well. Patient reports feeling relatively well this evening. She reports mild < 1/10 aching. She feels she is most challenged a limited by glenohumeral ER.    PAIN:  Are you having pain? No   OBJECTIVE: (objective measures completed at initial evaluation unless otherwise dated)   Patient Surveys  Eval: FOTO: 29, predicted score of 60   Posture Mild forward head, protracted scapulae   AROM          AROM (Normal range in degrees) AROM 12/13/2021 AROM 01/30/22    Right Left Right Left  Shoulder        Flexion   Deferred WNL 127  Extension   Deferred WNL 72  Abduction   Deferred WNL 133  External Rotation   Deferred WNL 40  Internal Rotation   Deferred WNL 68  Hands Behind Head   Deferred    Hands Behind Back   Deferred             Elbow        Flexion        Extension        Pronation        Supination        (* = pain; Blank rows = not tested)     PROM          PROM (Normal range in degrees) PROM 12/13/2021 PROM 01/30/22 PROM 01/30/22    Right Left Right Left  Shoulder        Flexion   50 WNL 145  Extension        Abduction      110  External Rotation   15 WNL 50   Internal Rotation   45 WNL WNL  Hands Behind Head        Hands Behind Back                 Elbow        Flexion   WNL  WNL  Extension   -10  0  Pronation   WNL  WNL  Supination   WNL  WNL  (* = pain; Blank rows = not tested)     LE MMT: MMT (out of 5) Right 12/13/2021 Left 12/13/2021 Right 01/30/22 Left 01/30/22           Shoulder     Flexion     5 4  Extension        Abduction     5 4  External rotation     5 4-  Internal rotation     5 4+  Horizontal abduction        Horizontal adduction        Lower Trapezius        Rhomboids                 Elbow    Flexion     5 4  Extension     5 4  Pronation        Supination                 Wrist    Flexion        Extension        Radial deviation        Ulnar deviation                 (* = pain; Blank rows = not tested)  Palpation   Location LEFT  RIGHT           Subocciptials      Cervical paraspinals      Upper Trapezius 0    Levator Scapulae      Rhomboid Major/Minor      Sternoclavicular joint 0    Acromioclavicular joint 2    Coracoid process 2    Bicipital groove 2    Supraspinatus 1    Infraspinatus 0    Subscapularis      Teres Minor      Teres Major      Pectoralis Major 1    Pectoralis Minor      Anterior Deltoid 1    Lateral Deltoid 1    Posterior Deltoid 0    Latissimus Dorsi      Sternocleidomastoid      (Blank rows = not tested) Graded on 0-4 scale (0 = no pain, 1 = pain, 2 = pain with wincing/grimacing/flinching, 3 = pain with withdrawal, 4 = unwilling to allow palpation), (Blank rows = not tested)            TODAY'S TREATMENT 02/01/22    Manual Therapy - for R shoulder ROM and to prevent R shoulder stiffness   L shoulder PROM into flexion, abduction, ER and IR as tolerated by patient with gentle overpressure into end-ROM; x 10 minutes L elbow PROM for flexion/extension and pronation/supination; x 1 minutes Gentle GHJ mobilizations at gr I-II, inferior and A-P for 2x30 sec  each  *not today* Gentle STM to L biceps, anterior and middle deltoid; x 2 minutes     Therapeutic Exercise - for shoulder complex ROM as needed for ability to perform reaching and self-care/ADLs   Upper body ergometer, 2 minutes forward, 2 minutes backward - for tissue warm-up to improve muscle performance, improved soft tissue mobility/extensibility - subjective information gathered during this billed time   Supine, wand ER AAROM, with elbow propped to 90 deg on table; 2x10 Sidelying shoulder abduction AROM; 1x10 with surgical UE Sidelying ER AROM; 2x10   Shoulder isometrics; shoulder flexion, ABD, and extension; x15 each direction, 5 sec - heavy demonstration and verbal/tactile cueing for technique             PATIENT EDUCATION: Discussed current progress in PT, expectations moving forward with therapy, continued plan of care and prognosis. Updated and reviewed HEP.         *not today* Wand flexion AAROM in supine; 2x10 Ball roll up wall, yellow physioball; x5 Swiss ball roll across table, shoulder abduction; x15, Green physioball  Pulley; flexion, 2 minutes AAROM Pendulums, forward/backward, side-to-side circles CW and CCW; x20 each with well arm propped on edge of treatment table Scapular retraction; reviewed Gripping; pink putty; 2 minutes Wrist AROM, flexion/extension and RD/UD; reviewed        PATIENT EDUCATION:  Education details: see above for patient education details Person educated: Patient Education method: Explanation, Handout Education comprehension: verbalized understanding     HOME EXERCISE PROGRAM: Access Code: [email or web address]      ASSESSMENT:   CLINICAL IMPRESSION: Patient has low-level pain at this time and is progressing well with shoulder ROM. She is most limited in shoulder complex abduction and glenohumeral external rotation. She tolerates isometrics well without notable pain along biceps or GHJ. Pt still needs further work on attaining full ROM  prior to progression of strengthening. Pt has remaining deficits in L shoulder strength (deltoid, RTC, periscapular mm), active and passive ROM, L  shoulder stiffness, postural changes, and anterior shoulder/biceps pain intermittently with ROM work. Patient will benefit from continued skilled therapeutic intervention to address the above deficits as needed for improved function and QoL.     REHAB POTENTIAL: Excellent   CLINICAL DECISION MAKING: Evolving/moderate complexity   EVALUATION COMPLEXITY: Low     GOALS: Goals reviewed with patient? Yes   SHORT TERM GOALS: Target date: 01/10/2022   Pt will be independent with HEP to improve strength and decrease neck pain to improve pain-free function at home and work. Baseline: 12/12/21: Baseline HEP initiated; 01/11/22: HEP recently updated  Goal status: Achieved;      LONG TERM GOALS: Target date: 02/07/2022   Pt will increase FOTO to at least 60 to demonstrate significant improvement in function at home and work related to neck pain  Baseline: 12/12/21: 29/60   01/11/22: 53/60  Goal status: IN PROGRESS   2.   Pt will have L shoulder AROM at least within 10 degrees of contralateral upper extremity indicative of improved ROM as needed for reaching, overhead work, self-care activities/dressing/grooming Baseline: 12/12/21: NO AROM presently, severely limited PROM in early post-op phase.   01/30/22: Shoulder IR WNL; pt is limited in all other planes of motion at this time Goal status: ON-GOING   3. Pt will improve L shoulder strength to at least 4+/5 or greater for all motions measured as needed for improved capacity to perform lifting and stabilize shoulder during working with power tools to complete home improvement projects and swing golf club       Baseline: 12/12/21: No MMTs due to early post-op status   01/30/22: MMT 4- to 4+ Goal status: ON-GOING   4. Pt will demonstrate golf swing with sound technique and no reproduction of shoulder  pain as needed for return to golfing Baseline: 12/12/21: Unable to complete shoulder AROM or compound motion required for golf swing   01/30/22: Deferred Goal status: DEFERRED     PLAN: PT FREQUENCY: 2x/week   PT DURATION: 8 weeks   PLANNED INTERVENTIONS: Therapeutic exercises, Therapeutic activity, Neuromuscular re-education, Patient/Family education, Joint mobilization, Dry Needling, Electrical stimulation, Cryotherapy, Moist heat, Traction, and Manual therapy   PLAN FOR NEXT SESSION: Continue with shoulder A/AAROM for gradually restoring full ROM, periscapular isotonics, 3-way shoulder isometrics. Recommend continued PT 2x/week for 4-6 weeks for restoration of motion and then to regain normal strength.    Consuela Mimes, PT, DPT #[id number]  Gertie Exon, PT 02/01/2022, 6:03 PM

## 2022-02-04 ENCOUNTER — Encounter: Payer: Self-pay | Admitting: Physical Therapy

## 2022-02-07 ENCOUNTER — Ambulatory Visit: Payer: Managed Care, Other (non HMO) | Admitting: Physical Therapy

## 2022-02-08 ENCOUNTER — Encounter: Payer: Managed Care, Other (non HMO) | Admitting: Physical Therapy

## 2022-02-08 NOTE — Addendum Note (Signed)
Addended by: Consuela Mimes T on: 02/08/2022 11:36 AM   Modules accepted: Orders

## 2022-02-09 ENCOUNTER — Ambulatory Visit: Payer: Managed Care, Other (non HMO) | Admitting: Physical Therapy

## 2022-02-09 NOTE — Telephone Encounter
I spoke [place] [employer] from [employer] stated that [employer] Appeal is still pending and it has a dSue Lushsion due date on 02/18/22. [id number] reference# [id number]. IHebert Sohock back on 02/18/22.Kennia Vanvorst, CPHTMedication Management Clinic [facility] [address], [place] [phone]

## 2022-02-15 ENCOUNTER — Ambulatory Visit: Payer: Managed Care, Other (non HMO) | Admitting: Physical Therapy

## 2022-02-15 DIAGNOSIS — M6281 Muscle weakness (generalized): Secondary | ICD-10-CM

## 2022-02-15 DIAGNOSIS — M25512 Pain in left shoulder: Secondary | ICD-10-CM | POA: Diagnosis not present

## 2022-02-15 DIAGNOSIS — M25612 Stiffness of left shoulder, not elsewhere classified: Secondary | ICD-10-CM

## 2022-02-15 NOTE — Therapy (Signed)
OUTPATIENT PHYSICAL THERAPY TREATMENT   Patient Name: Ariana Jefferson MRN: [id number] DOB:[birth year], 61 y.o., female Today's Date: 02/15/2022  PCP: Leim Fabry, MD REFERRING PROVIDER: Gunnar Fusi, PA  END OF SESSION:   PT End of Session - 02/15/22 1636     Visit Number 12    Number of Visits 17    Date for PT Re-Evaluation 02/10/22    Authorization Type [employer] 2023, 60 combined PT/OT/Chiro    Authorization Time Period 12/12/21-02/10/22    Progress Note Due on Visit 10    PT Start Time 1630    PT Stop Time 1713    PT Time Calculation (min) 43 min    Activity Tolerance Patient tolerated treatment well;Patient limited by pain    Behavior During Therapy WFL for tasks assessed/performed              Past Medical History:  Diagnosis Date   High cholesterol    Past Surgical History:  Procedure Laterality Date   CESAREAN SECTION     CHOLECYSTECTOMY     KNEE SURGERY     LAPAROSCOPIC GASTRIC SLEEVE RESECTION     Patient Active Problem List   Diagnosis Date Noted   Hx of skin cancer, basal cell 02/07/2018   Obesity 02/07/2018   High cholesterol 12/11/2016   Low vitamin B12 level 12/11/2016   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   Lung nodule seen on imaging study 11/13/2014   DJD (degenerative joint disease) 05/14/2013    REFERRING DIAG:  K93.818 (ICD-10-CM) - S/P arthroscopy of left shoulder  Z01.818 (ICD-10-CM) - Encounter for other preprocedural examination    THERAPY DIAG:  Acute pain of left shoulder  Stiffness of left shoulder, not elsewhere classified  Muscle weakness (generalized)  Rationale for Evaluation and Treatment Rehabilitation  PERTINENT HISTORY: Pt is a 61 year old female s/p L shoulder arthroscopy with biceps tenodesis, resection of adhesions, SAD, DCE, debridement. Patient reports no post-op complications. Pt reports post-op pain is better than she anticipated; pt reports mild pain presently. Patient reports no instructions yet on  exercises to do with shoulder. Pt has weaned from opioid analgesic pain medicine; pt alternates Tylenol and Ibuprofen as needed. Pt denies recent physical trauma. Pt had emergency hemicholectomy earlier this year with good post-op recovery. Pt has been compliant with sling use as directed by her surgeon.       Patient Goals: Patient wants to return to golf, being able to remodel her home/paint, able to reach above head     PRECAUTIONS: No active biceps x 6 weeks, no resisted elbow flexion or supination, A/AAROM at 4 weeks post-op   DOS: 11/24/21   SUBJECTIVE:  SUBJECTIVE STATEMENT:  Patient has incisional hernia repair scheduled for 02/22/2022. Pt reports mild soreness in her L shoulder. Patient reports some distress with having another procedure scheduled and not being able to do her normal activities for a long time. She feels that her pain has largely gotten better, though she is still notably challenged with end-range shoulder complex abduction and most significantly with GHJ ER.    PAIN:  Are you having pain? No significant pain at arrival   OBJECTIVE: (objective measures completed at initial evaluation unless otherwise dated)   Patient Surveys  Eval: FOTO: 29, predicted score of 60   Posture Mild forward head, protracted scapulae   AROM          AROM (Normal range in degrees) AROM 12/13/2021 AROM 01/30/22    Right Left Right Left  Shoulder        Flexion   Deferred WNL 127  Extension   Deferred WNL 72  Abduction   Deferred WNL 133  External Rotation   Deferred WNL 40  Internal Rotation   Deferred WNL 68  Hands Behind Head   Deferred    Hands Behind Back   Deferred             Elbow        Flexion        Extension        Pronation        Supination        (* = pain; Blank rows = not  tested)     PROM          PROM (Normal range in degrees) PROM 12/13/2021 PROM 01/30/22 PROM 01/30/22    Right Left Right Left  Shoulder        Flexion   50 WNL 145  Extension        Abduction      110  External Rotation   15 WNL 50  Internal Rotation   45 WNL WNL  Hands Behind Head        Hands Behind Back                 Elbow        Flexion   WNL  WNL  Extension   -10  0  Pronation   WNL  WNL  Supination   WNL  WNL  (* = pain; Blank rows = not tested)     LE MMT: MMT (out of 5) Right 12/13/2021 Left 12/13/2021 Right 01/30/22 Left 01/30/22           Shoulder     Flexion     5 4  Extension        Abduction     5 4  External rotation     5 4-  Internal rotation     5 4+  Horizontal abduction        Horizontal adduction        Lower Trapezius        Rhomboids                 Elbow    Flexion     5 4  Extension     5 4  Pronation        Supination                 Wrist    Flexion        Extension  Radial deviation        Ulnar deviation                 (* = pain; Blank rows = not tested)    Palpation   Location LEFT  RIGHT           Subocciptials      Cervical paraspinals      Upper Trapezius 0    Levator Scapulae      Rhomboid Major/Minor      Sternoclavicular joint 0    Acromioclavicular joint 2    Coracoid process 2    Bicipital groove 2    Supraspinatus 1    Infraspinatus 0    Subscapularis      Teres Minor      Teres Major      Pectoralis Major 1    Pectoralis Minor      Anterior Deltoid 1    Lateral Deltoid 1    Posterior Deltoid 0    Latissimus Dorsi      Sternocleidomastoid      (Blank rows = not tested) Graded on 0-4 scale (0 = no pain, 1 = pain, 2 = pain with wincing/grimacing/flinching, 3 = pain with withdrawal, 4 = unwilling to allow palpation), (Blank rows = not tested)            TODAY'S TREATMENT 02/15/22    Manual Therapy - for R shoulder ROM and to prevent R shoulder stiffness   L shoulder PROM into  flexion, abduction, ER and IR as tolerated by patient with gentle overpressure into end-ROM; x 10 minutes Gentle GHJ mobilizations at gr I-II, inferior and A-P for 2x30 sec each   *not today* L elbow PROM for flexion/extension and pronation/supination; x 1 minutes Gentle STM to L biceps, anterior and middle deltoid; x 2 minutes     Therapeutic Exercise - for shoulder complex ROM as needed for ability to perform reaching and self-care/ADLs   Upper body ergometer, 2 minutes forward, 2 minutes backward - for tissue warm-up to improve muscle performance, improved soft tissue mobility/extensibility - subjective information gathered during this billed time   Supine flexion AROM; 2x10  Sidelying shoulder abduction AROM; 1x10 with surgical UE Sidelying ER AROM; 2x10  Functional IR (hand behind back) AAROM with strap; x10, 5 sec hold                PATIENT EDUCATION: Discussed at length current plan given context of upcoming hernia repair surgery. Discussed typical activity restrictions and tentative hold on strengthening until specific weight limit is discussed with surgeon. Updated HEP for continued shoulder ROM work.         *not today* Shoulder isometrics; shoulder flexion, ABD, and extension; x15 each direction, 5 sec - heavy demonstration and verbal/tactile cueing for technique Supine, wand ER AAROM, with elbow propped to 90 deg on table; 2x10 Wand flexion AAROM in supine; 2x10 Ball roll up wall, yellow physioball; x5 Swiss ball roll across table, shoulder abduction; x15, Green physioball  Pulley; flexion, 2 minutes AAROM Pendulums, forward/backward, side-to-side circles CW and CCW; x20 each with well arm propped on edge of treatment table Scapular retraction; reviewed Gripping; pink putty; 2 minutes Wrist AROM, flexion/extension and RD/UD; reviewed        PATIENT EDUCATION:  Education details: see above for patient education details Person educated: Patient Education  method: Explanation, Handout Education comprehension: verbalized understanding     HOME EXERCISE PROGRAM: Access Code: [email or web address]  ASSESSMENT:   CLINICAL IMPRESSION: Patient is progressing well with shoulder ROM and has forward elevation approaching symmetry with non-surgical upper limb. She has vastly improved abduction and ER, though these still are markedly limited. Pt is most limited with functional IR (hand behind back). Updated HEP for functional IR AAROM within pt tolerance. Pt will continue with A/AAROM work on home and is following up with referring office in early January. Given upcoming hernia repair, we will need to hold on significant loading/resistance exercises moving forward until cleared by surgeon. Pt has remaining deficits in L shoulder strength (deltoid, RTC, periscapular mm), active and passive ROM, L shoulder stiffness, postural changes, and anterior shoulder/biceps pain intermittently with ROM work. Patient will benefit from continued skilled therapeutic intervention to address the above deficits as needed for improved function and QoL.     REHAB POTENTIAL: Excellent   CLINICAL DECISION MAKING: Evolving/moderate complexity   EVALUATION COMPLEXITY: Low     GOALS: Goals reviewed with patient? Yes   SHORT TERM GOALS: Target date: 01/10/2022   Pt will be independent with HEP to improve strength and decrease neck pain to improve pain-free function at home and work. Baseline: 12/12/21: Baseline HEP initiated; 01/11/22: HEP recently updated  Goal status: Achieved;      LONG TERM GOALS: Target date: 02/07/2022   Pt will increase FOTO to at least 60 to demonstrate significant improvement in function at home and work related to neck pain  Baseline: 12/12/21: 29/60   01/11/22: 53/60  Goal status: IN PROGRESS   2.   Pt will have L shoulder AROM at least within 10 degrees of contralateral upper extremity indicative of improved ROM as needed for reaching, overhead  work, self-care activities/dressing/grooming Baseline: 12/12/21: NO AROM presently, severely limited PROM in early post-op phase.   01/30/22: Shoulder IR WNL; pt is limited in all other planes of motion at this time Goal status: ON-GOING   3. Pt will improve L shoulder strength to at least 4+/5 or greater for all motions measured as needed for improved capacity to perform lifting and stabilize shoulder during working with power tools to complete home improvement projects and swing golf club       Baseline: 12/12/21: No MMTs due to early post-op status   01/30/22: MMT 4- to 4+ Goal status: ON-GOING   4. Pt will demonstrate golf swing with sound technique and no reproduction of shoulder pain as needed for return to golfing Baseline: 12/12/21: Unable to complete shoulder AROM or compound motion required for golf swing   01/30/22: Deferred Goal status: DEFERRED     PLAN: PT FREQUENCY: 2x/week   PT DURATION: 8 weeks   PLANNED INTERVENTIONS: Therapeutic exercises, Therapeutic activity, Neuromuscular re-education, Patient/Family education, Joint mobilization, Dry Needling, Electrical stimulation, Cryotherapy, Moist heat, Traction, and Manual therapy   PLAN FOR NEXT SESSION: Continue with shoulder A/AAROM for gradually restoring full ROM, periscapular isotonics, 3-way shoulder isometrics. Recommend continued PT 2x/week for 4-6 weeks for restoration of motion; will initiate strengthening once cleared by surgeon s/p incisional hernie repair.    Consuela MimesJeremy Melida Northington, PT, DPT #[id number]  Gertie ExonJeremy T Shey Yott, PT 02/15/2022, 4:36 PM

## 2022-02-18 ENCOUNTER — Encounter: Payer: Self-pay | Admitting: Physical Therapy

## 2022-02-21 ENCOUNTER — Ambulatory Visit: Payer: Managed Care, Other (non HMO) | Admitting: Physical Therapy

## 2022-02-21 NOTE — Telephone Encounter
I spoke to [employer] froSara Lee BCBSIzetta Dakin Raye confirmed that Appeal has been denied as of 02/18/22. I wUpper [facility] Centerill route Sherlie Banunter to [facility] Pharmacist.Damier Disano, CPHTMedication Management CliVa [facility] - Tuscaloosaalth[address], [id number]:[phone]: [phone]

## 2022-02-23 ENCOUNTER — Encounter: Payer: Managed Care, Other (non HMO) | Admitting: Physical Therapy

## 2022-02-24 ENCOUNTER — Encounter: Admit: 2022-02-24 | Payer: PRIVATE HEALTH INSURANCE | Attending: Internal Medicine | Primary: Internal Medicine

## 2022-02-24 MED ORDER — DEXMETHYLPHENIDATE ER 20 MG CAPSULE,EXTENDED RELEASE BIPHASIC50-50
20 | ORAL_CAPSULE | ORAL | 1 refills | 30.00000 days | Status: AC
Start: 2022-02-24 — End: 2022-04-19

## 2022-02-27 ENCOUNTER — Encounter: Admit: 2022-02-27 | Payer: PRIVATE HEALTH INSURANCE | Primary: Internal Medicine

## 2022-02-27 ENCOUNTER — Encounter: Payer: Managed Care, Other (non HMO) | Admitting: Physical Therapy

## 2022-03-01 ENCOUNTER — Encounter: Payer: Managed Care, Other (non HMO) | Admitting: Physical Therapy

## 2022-03-06 ENCOUNTER — Ambulatory Visit: Payer: Managed Care, Other (non HMO) | Attending: Physician Assistant | Admitting: Physical Therapy

## 2022-03-06 DIAGNOSIS — M25612 Stiffness of left shoulder, not elsewhere classified: Secondary | ICD-10-CM

## 2022-03-06 DIAGNOSIS — M6281 Muscle weakness (generalized): Secondary | ICD-10-CM | POA: Insufficient documentation

## 2022-03-06 DIAGNOSIS — M25512 Pain in left shoulder: Secondary | ICD-10-CM | POA: Insufficient documentation

## 2022-03-06 NOTE — Therapy (Deleted)
OUTPATIENT PHYSICAL THERAPY TREATMENT   Patient Name: Ariana Jefferson MRN: [id number] DOB:03/24/1960, 62 y.o., female Today's Date: 03/06/2022  PCP: Gayland Curry, MD REFERRING PROVIDER: Ann Lions, PA  END OF SESSION:      Past Medical History:  Diagnosis Date   High cholesterol    Past Surgical History:  Procedure Laterality Date   CESAREAN SECTION     CHOLECYSTECTOMY     KNEE SURGERY     LAPAROSCOPIC GASTRIC SLEEVE RESECTION     Patient Active Problem List   Diagnosis Date Noted   Hx of skin cancer, basal cell 02/07/2018   Obesity 02/07/2018   High cholesterol 12/11/2016   Low vitamin B12 level 12/11/2016   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   Lung nodule seen on imaging study 11/13/2014   DJD (degenerative joint disease) 05/14/2013    REFERRING DIAG:  [id number] (ICD-10-CM) - S/P arthroscopy of left shoulder  Z01.818 (ICD-10-CM) - Encounter for other preprocedural examination    THERAPY DIAG:  Acute pain of left shoulder  Stiffness of left shoulder, not elsewhere classified  Muscle weakness (generalized)  Rationale for Evaluation and Treatment Rehabilitation  PERTINENT HISTORY: Pt is a 62 year old female s/p L shoulder arthroscopy with biceps tenodesis, resection of adhesions, SAD, DCE, debridement. Patient reports no post-op complications. Pt reports post-op pain is better than she anticipated; pt reports mild pain presently. Patient reports no instructions yet on exercises to do with shoulder. Pt has weaned from opioid analgesic pain medicine; pt alternates Tylenol and Ibuprofen as needed. Pt denies recent physical trauma. Pt had emergency hemicholectomy earlier this year with good post-op recovery. Pt has been compliant with sling use as directed by her surgeon.       Patient Goals: Patient wants to return to golf, being able to remodel her home/paint, able to reach above head     PRECAUTIONS: No active biceps x 6 weeks, no resisted elbow  flexion or supination, A/AAROM at 4 weeks post-op   DOS: 11/24/21   SUBJECTIVE:                                                                                                                                                                                      SUBJECTIVE STATEMENT:  Patient has incisional hernia repair scheduled for 02/22/2022. Pt reports mild soreness in her L shoulder. Patient reports some distress with having another procedure scheduled and not being able to do her normal activities for a long time. She feels that her pain has largely gotten better, though she is still notably challenged with end-range shoulder complex abduction and most significantly with GHJ ER.    PAIN:  Are you having  pain? No significant pain at arrival   OBJECTIVE: (objective measures completed at initial evaluation unless otherwise dated)   Patient Surveys  Eval: FOTO: 29, predicted score of 60   Posture Mild forward head, protracted scapulae   AROM          AROM (Normal range in degrees) AROM 12/13/2021 AROM 01/30/22    Right Left Right Left  Shoulder        Flexion   Deferred WNL 127  Extension   Deferred WNL 72  Abduction   Deferred WNL 133  External Rotation   Deferred WNL 40  Internal Rotation   Deferred WNL 68  Hands Behind Head   Deferred    Hands Behind Back   Deferred             Elbow        Flexion        Extension        Pronation        Supination        (* = pain; Blank rows = not tested)     PROM          PROM (Normal range in degrees) PROM 12/13/2021 PROM 01/30/22 PROM 01/30/22    Right Left Right Left  Shoulder        Flexion   50 WNL 145  Extension        Abduction      110  External Rotation   15 WNL 50  Internal Rotation   45 WNL WNL  Hands Behind Head        Hands Behind Back                 Elbow        Flexion   WNL  WNL  Extension   -10  0  Pronation   WNL  WNL  Supination   WNL  WNL  (* = pain; Blank rows = not tested)     LE  MMT: MMT (out of 5) Right 12/13/2021 Left 12/13/2021 Right 01/30/22 Left 01/30/22           Shoulder     Flexion     5 4  Extension        Abduction     5 4  External rotation     5 4-  Internal rotation     5 4+  Horizontal abduction        Horizontal adduction        Lower Trapezius        Rhomboids                 Elbow    Flexion     5 4  Extension     5 4  Pronation        Supination                 Wrist    Flexion        Extension        Radial deviation        Ulnar deviation                 (* = pain; Blank rows = not tested)    Palpation   Location LEFT  RIGHT           Subocciptials      Cervical paraspinals      Upper Trapezius 0    Levator  Scapulae      Rhomboid Major/Minor      Sternoclavicular joint 0    Acromioclavicular joint 2    Coracoid process 2    Bicipital groove 2    Supraspinatus 1    Infraspinatus 0    Subscapularis      Teres Minor      Teres Major      Pectoralis Major 1    Pectoralis Minor      Anterior Deltoid 1    Lateral Deltoid 1    Posterior Deltoid 0    Latissimus Dorsi      Sternocleidomastoid      (Blank rows = not tested) Graded on 0-4 scale (0 = no pain, 1 = pain, 2 = pain with wincing/grimacing/flinching, 3 = pain with withdrawal, 4 = unwilling to allow palpation), (Blank rows = not tested)            TODAY'S TREATMENT 03/06/22    Manual Therapy - for R shoulder ROM and to prevent R shoulder stiffness   L shoulder PROM into flexion, abduction, ER and IR as tolerated by patient with gentle overpressure into end-ROM; x 10 minutes Gentle GHJ mobilizations at gr I-II, inferior and A-P for 2x30 sec each   *not today* L elbow PROM for flexion/extension and pronation/supination; x 1 minutes Gentle STM to L biceps, anterior and middle deltoid; x 2 minutes     Therapeutic Exercise - for shoulder complex ROM as needed for ability to perform reaching and self-care/ADLs   Upper body ergometer, 2 minutes  forward, 2 minutes backward - for tissue warm-up to improve muscle performance, improved soft tissue mobility/extensibility - subjective information gathered during this billed time   Supine flexion AROM; 2x10  Sidelying shoulder abduction AROM; 1x10 with surgical UE Sidelying ER AROM; 2x10  Functional IR (hand behind back) AAROM with strap; x10, 5 sec hold                PATIENT EDUCATION: Discussed at length current plan given context of upcoming hernia repair surgery. Discussed typical activity restrictions and tentative hold on strengthening until specific weight limit is discussed with surgeon. Updated HEP for continued shoulder ROM work.         *not today* Shoulder isometrics; shoulder flexion, ABD, and extension; x15 each direction, 5 sec - heavy demonstration and verbal/tactile cueing for technique Supine, wand ER AAROM, with elbow propped to 90 deg on table; 2x10 Wand flexion AAROM in supine; 2x10 Ball roll up wall, yellow physioball; x5 Swiss ball roll across table, shoulder abduction; x15, Green physioball  Pulley; flexion, 2 minutes AAROM Pendulums, forward/backward, side-to-side circles CW and CCW; x20 each with well arm propped on edge of treatment table Scapular retraction; reviewed Gripping; pink putty; 2 minutes Wrist AROM, flexion/extension and RD/UD; reviewed        PATIENT EDUCATION:  Education details: see above for patient education details Person educated: Patient Education method: Explanation, Handout Education comprehension: verbalized understanding     HOME EXERCISE PROGRAM: Access Code: [email or web address]      ASSESSMENT:   CLINICAL IMPRESSION: Patient is progressing well with shoulder ROM and has forward elevation approaching symmetry with non-surgical upper limb. She has vastly improved abduction and ER, though these still are markedly limited. Pt is most limited with functional IR (hand behind back). Updated HEP for functional IR AAROM within pt  tolerance. Pt will continue with A/AAROM work on home and is following up with referring office in early January. Given upcoming  hernia repair, we will need to hold on significant loading/resistance exercises moving forward until cleared by surgeon. Pt has remaining deficits in L shoulder strength (deltoid, RTC, periscapular mm), active and passive ROM, L shoulder stiffness, postural changes, and anterior shoulder/biceps pain intermittently with ROM work. Patient will benefit from continued skilled therapeutic intervention to address the above deficits as needed for improved function and QoL.     REHAB POTENTIAL: Excellent   CLINICAL DECISION MAKING: Evolving/moderate complexity   EVALUATION COMPLEXITY: Low     GOALS: Goals reviewed with patient? Yes   SHORT TERM GOALS: Target date: 01/10/2022   Pt will be independent with HEP to improve strength and decrease neck pain to improve pain-free function at home and work. Baseline: 12/12/21: Baseline HEP initiated; 01/11/22: HEP recently updated  Goal status: Achieved;      LONG TERM GOALS: Target date: 02/07/2022   Pt will increase FOTO to at least 60 to demonstrate significant improvement in function at home and work related to neck pain  Baseline: 12/12/21: 29/60   01/11/22: 53/60  Goal status: IN PROGRESS   2.   Pt will have L shoulder AROM at least within 10 degrees of contralateral upper extremity indicative of improved ROM as needed for reaching, overhead work, self-care activities/dressing/grooming Baseline: 12/12/21: NO AROM presently, severely limited PROM in early post-op phase.   01/30/22: Shoulder IR WNL; pt is limited in all other planes of motion at this time Goal status: ON-GOING   3. Pt will improve L shoulder strength to at least 4+/5 or greater for all motions measured as needed for improved capacity to perform lifting and stabilize shoulder during working with power tools to complete home improvement projects and swing  golf club       Baseline: 12/12/21: No MMTs due to early post-op status   01/30/22: MMT 4- to 4+ Goal status: ON-GOING   4. Pt will demonstrate golf swing with sound technique and no reproduction of shoulder pain as needed for return to golfing Baseline: 12/12/21: Unable to complete shoulder AROM or compound motion required for golf swing   01/30/22: Deferred Goal status: DEFERRED     PLAN: PT FREQUENCY: 2x/week   PT DURATION: 8 weeks   PLANNED INTERVENTIONS: Therapeutic exercises, Therapeutic activity, Neuromuscular re-education, Patient/Family education, Joint mobilization, Dry Needling, Electrical stimulation, Cryotherapy, Moist heat, Traction, and Manual therapy   PLAN FOR NEXT SESSION: Continue with shoulder A/AAROM for gradually restoring full ROM, periscapular isotonics, 3-way shoulder isometrics. Recommend continued PT 2x/week for 4-6 weeks for restoration of motion; will initiate strengthening once cleared by surgeon s/p incisional hernie repair.    Valentina Gu, PT, DPT [id number]  Eilleen Kempf, PT 03/06/2022, 7:55 AM

## 2022-03-08 ENCOUNTER — Ambulatory Visit: Payer: Managed Care, Other (non HMO) | Admitting: Physical Therapy

## 2022-03-13 ENCOUNTER — Encounter: Payer: Self-pay | Admitting: Physical Therapy

## 2022-03-13 ENCOUNTER — Ambulatory Visit: Payer: Managed Care, Other (non HMO) | Admitting: Physical Therapy

## 2022-03-13 DIAGNOSIS — M6281 Muscle weakness (generalized): Secondary | ICD-10-CM

## 2022-03-13 DIAGNOSIS — M25612 Stiffness of left shoulder, not elsewhere classified: Secondary | ICD-10-CM

## 2022-03-13 DIAGNOSIS — M25512 Pain in left shoulder: Secondary | ICD-10-CM

## 2022-03-13 NOTE — Therapy (Signed)
OUTPATIENT PHYSICAL THERAPY TREATMENT/GOAL UPDATE   Patient Name: Ariana Jefferson MRN: [id number] DOB:1960-07-21, 62 y.o., female Today's Date: 03/13/2022  PCP: Leim Fabry, MD REFERRING PROVIDER: Gunnar Fusi, PA  END OF SESSION:   PT End of Session - 03/15/22 0620     Visit Number 13    Number of Visits 17    Date for PT Re-Evaluation 04/20/22    Authorization Type [employer] 2023, 60 combined PT/OT/Chiro    Authorization Time Period 12/12/21-02/10/22    Progress Note Due on Visit 10    PT Start Time 1724    PT Stop Time 1804    PT Time Calculation (min) 40 min    Activity Tolerance Patient tolerated treatment well;Patient limited by pain    Behavior During Therapy [facility] for tasks assessed/performed             Past Medical History:  Diagnosis Date   High cholesterol    Past Surgical History:  Procedure Laterality Date   CESAREAN SECTION     CHOLECYSTECTOMY     KNEE SURGERY     LAPAROSCOPIC GASTRIC SLEEVE RESECTION     Patient Active Problem List   Diagnosis Date Noted   Hx of skin cancer, basal cell 02/07/2018   Obesity 02/07/2018   High cholesterol 12/11/2016   Low vitamin B12 level 12/11/2016   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   Lung nodule seen on imaging study 11/13/2014   DJD (degenerative joint disease) 05/14/2013    REFERRING DIAG:  P73.578 (ICD-10-CM) - S/P arthroscopy of left shoulder  Z01.818 (ICD-10-CM) - Encounter for other preprocedural examination    THERAPY DIAG:  Acute pain of left shoulder  Stiffness of left shoulder, not elsewhere classified  Muscle weakness (generalized)  Rationale for Evaluation and Treatment Rehabilitation  PERTINENT HISTORY: Pt is a 62 year old female s/p L shoulder arthroscopy with biceps tenodesis, resection of adhesions, SAD, DCE, debridement. Patient reports no post-op complications. Pt reports post-op pain is better than she anticipated; pt reports mild pain presently. Patient reports no  instructions yet on exercises to do with shoulder. Pt has weaned from opioid analgesic pain medicine; pt alternates Tylenol and Ibuprofen as needed. Pt denies recent physical trauma. Pt had emergency hemicholectomy earlier this year with good post-op recovery. Pt has been compliant with sling use as directed by her surgeon.       Patient Goals: Patient wants to return to golf, being able to remodel her home/paint, able to reach above head     PRECAUTIONS: No active biceps x 6 weeks, no resisted elbow flexion or supination, A/AAROM at 4 weeks post-op   DOS: 11/24/21   SUBJECTIVE:  SUBJECTIVE STATEMENT:  Patient reports difficulty completing shoulder ROM work/HEP in acute post-op phase. Patient missed PT due to hernia repair. Pt is concerned that her ROM may have regressed due to recent less participation with HEP. 70% SANE score. Patient reports L shoulder stiffness at this time; she feels that her pain is minimal post-operatively.   PAIN:  Are you having pain? No significant pain at arrival   OBJECTIVE: (objective measures completed at initial evaluation unless otherwise dated)   Patient Surveys  Eval: FOTO: 29, predicted score of 60   Posture Mild forward head, protracted scapulae   AROM            AROM (Normal range in degrees) AROM 12/13/2021 AROM 01/30/22 AROM 03/13/22    Right Left Right Left Right Left  Shoulder          Flexion   Deferred WNL 127 WFL 128  Extension   Deferred WNL 72 WFL 64  Abduction   Deferred WNL 133 WFL 121  External Rotation   Deferred WNL 40 WFL 84  Internal Rotation   Deferred WNL 68 WFL 70  Hands Behind Head   Deferred      Hands Behind Back   Deferred                 Elbow          Flexion          Extension          Pronation          Supination          (* =  pain; Blank rows = not tested)     PROM           PROM (Normal range in degrees) PROM 12/13/2021 PROM 01/30/22 PROM 01/30/22 PROM 03/13/2022    Right Left Right Left Left  Shoulder         Flexion   50 WNL 145 159  Extension         Abduction      110 130  External Rotation   15 WNL 50 62  Internal Rotation   45 WNL WNL WNL  Hands Behind Head         Hands Behind Back                   Elbow         Flexion   WNL  WNL   Extension   -10  0   Pronation   WNL  WNL   Supination   WNL  WNL   (* = pain; Blank rows = not tested)     LE MMT: **MMT deferred on 03/13/22 due to recent hernia repair and post-op restrictions MMT (out of 5) Right 12/13/2021 Left 12/13/2021 Right 01/30/22 Left 01/30/22           Shoulder     Flexion     5 4  Extension        Abduction     5 4  External rotation     5 4-  Internal rotation     5 4+  Horizontal abduction        Horizontal adduction        Lower Trapezius        Rhomboids                 Elbow    Flexion     5 4  Extension  5 4  Pronation        Supination                 Wrist    Flexion        Extension        Radial deviation        Ulnar deviation                 (* = pain; Blank rows = not tested)    Palpation   Location LEFT  RIGHT           Subocciptials      Cervical paraspinals      Upper Trapezius 0    Levator Scapulae      Rhomboid Major/Minor      Sternoclavicular joint 0    Acromioclavicular joint 2    Coracoid process 2    Bicipital groove 2    Supraspinatus 1    Infraspinatus 0    Subscapularis      Teres Minor      Teres Major      Pectoralis Major 1    Pectoralis Minor      Anterior Deltoid 1    Lateral Deltoid 1    Posterior Deltoid 0    Latissimus Dorsi      Sternocleidomastoid      (Blank rows = not tested) Graded on 0-4 scale (0 = no pain, 1 = pain, 2 = pain with wincing/grimacing/flinching, 3 = pain with withdrawal, 4 = unwilling to allow palpation), (Blank rows = not  tested)            TODAY'S TREATMENT 03/13/22    Manual Therapy - for R shoulder ROM and to prevent R shoulder stiffness   L shoulder PROM into flexion, abduction, ER and IR as tolerated by patient with gentle overpressure into end-ROM; x 15 minutes Gentle GHJ mobilizations at gr I-II, inferior and A-P for 1x30 sec each Glenohumeral mobilizations  at gr III for improved mobility, inferior and posterior; 1x30 sec each    *not today* L elbow PROM for flexion/extension and pronation/supination; x 1 minutes Gentle STM to L biceps, anterior and middle deltoid; x 2 minutes     Therapeutic Exercise - for shoulder complex ROM as needed for ability to perform reaching and self-care/ADLs  Upper body ergometer, 2 minutes forward, 2 minutes backward - for tissue warm-up to improve muscle performance, improved soft tissue mobility/extensibility - subjective information gathered during this billed time   *GOAL UPDATE PERFORMED  Finger ladder; x5, 5 sec for flexion and abduction   PATIENT EDUCATION: Discussed holding on resistance drills/strengthening due to post-operative status (s/p hernia repair 03/02/22). Pt to continue ROM work at home. Discussed avoidance of Valsalva maneuver.     *next visit* Supine flexion AROM; 2x10 Functional IR (hand behind back) AAROM with strap; x10, 5 sec hold   Sidelying ER AROM; 2x10   *not today* Sidelying shoulder abduction AROM; 1x10 with surgical UE Shoulder isometrics; shoulder flexion, ABD, and extension; x15 each direction, 5 sec - heavy demonstration and verbal/tactile cueing for technique Supine, wand ER AAROM, with elbow propped to 90 deg on table; 2x10 Wand flexion AAROM in supine; 2x10 Ball roll up wall, yellow physioball; x5 Swiss ball roll across table, shoulder abduction; x15, Green physioball  Pulley; flexion, 2 minutes AAROM Pendulums, forward/backward, side-to-side circles CW and CCW; x20 each with well arm propped on edge of  treatment table Scapular retraction; reviewed Gripping; pink  putty; 2 minutes Wrist AROM, flexion/extension and RD/UD; reviewed        PATIENT EDUCATION:  Education details: see above for patient education details Person educated: Patient Education method: Explanation, Handout Education comprehension: verbalized understanding     HOME EXERCISE PROGRAM: Access Code: [email or web address]      ASSESSMENT:   CLINICAL IMPRESSION: Patient has ongoing ROM deficits and has been out of PT due to comorbid incisional hernia requiring repair (pt had hernia repair 03/02/22). Patient does have low level of shoulder pain, but she does have ongoing stiffness/motion loss necessitating further PT intervention. We will need to hold on significant loading/resistance exercise due to current post-operative status to protect hernia repair. Pt has modestly improved ROM since last follow-up. She has had limited attendance over previous month, and will need to be consistent with HEP and PT attendance for best return to PLOF. Pt has remaining deficits in L shoulder strength (deltoid, RTC, periscapular mm), active and passive ROM, L shoulder stiffness, postural changes, and anterior shoulder/biceps pain intermittently with ROM work. Patient will benefit from continued skilled therapeutic intervention to address the above deficits as needed for improved function and QoL.     REHAB POTENTIAL: Excellent   CLINICAL DECISION MAKING: Evolving/moderate complexity   EVALUATION COMPLEXITY: Low     GOALS: Goals reviewed with patient? Yes   SHORT TERM GOALS: Target date: 01/10/2022   Pt will be independent with HEP to improve strength and decrease neck pain to improve pain-free function at home and work. Baseline: 12/12/21: Baseline HEP initiated; 01/11/22: HEP recently updated  Goal status: Achieved;      LONG TERM GOALS: Target date: 02/07/2022   Pt will increase FOTO to at least 60 to demonstrate significant  improvement in function at home and work related to neck pain  Baseline: 12/12/21: 29/60   01/11/22: 53/60   03/13/22:FOTO to be obtained next visit Goal status: IN PROGRESS   2.   Pt will have L shoulder AROM at least within 10 degrees of contralateral upper extremity indicative of improved ROM as needed for reaching, overhead work, self-care activities/dressing/grooming Baseline: 12/12/21: NO AROM presently, severely limited PROM in early post-op phase.   01/30/22: Shoulder IR WNL; pt is limited in all other planes of motion at this time    03/13/22: Pt has primary remaining AROM limitations in flexion and abduction Goal status: ON-GOING   3. Pt will improve L shoulder strength to at least 4+/5 or greater for all motions measured as needed for improved capacity to perform lifting and stabilize shoulder during working with power tools to complete home improvement projects and swing golf club       Baseline: 12/12/21: No MMTs due to early post-op status   01/30/22: MMT 4- to 4+      03/13/22: Deferred Goal status: ON-GOING   4. Pt will demonstrate golf swing with sound technique and no reproduction of shoulder pain as needed for return to golfing Baseline: 12/12/21: Unable to complete shoulder AROM or compound motion required for golf swing   01/30/22: Deferred.   03/13/22: Deferred.  Goal status: DEFERRED     PLAN: PT FREQUENCY: 1-2x/week   PT DURATION:  4-6 weeks   PLANNED INTERVENTIONS: Therapeutic exercises, Therapeutic activity, Neuromuscular re-education, Patient/Family education, Joint mobilization, Dry Needling, Electrical stimulation, Cryotherapy, Moist heat, Traction, and Manual therapy   PLAN FOR NEXT SESSION: Obtain FOTO next visit. Continue with shoulder A/AAROM for gradually restoring full ROM. Will hold on resistance drills/strengthening until 6 weeks s/p  hernia repair.    Valentina Gu, PT, DPT #[id number]  Eilleen Kempf, PT 03/15/2022, 6:21 AM

## 2022-03-15 ENCOUNTER — Encounter: Payer: Self-pay | Admitting: Physical Therapy

## 2022-03-15 ENCOUNTER — Ambulatory Visit: Payer: Managed Care, Other (non HMO) | Admitting: Physical Therapy

## 2022-03-15 DIAGNOSIS — M25612 Stiffness of left shoulder, not elsewhere classified: Secondary | ICD-10-CM

## 2022-03-15 DIAGNOSIS — M25512 Pain in left shoulder: Secondary | ICD-10-CM | POA: Diagnosis not present

## 2022-03-15 DIAGNOSIS — M6281 Muscle weakness (generalized): Secondary | ICD-10-CM

## 2022-03-15 NOTE — Therapy (Signed)
OUTPATIENT PHYSICAL THERAPY TREATMENT  Patient Name: Ariana Jefferson MRN: [id number] DOB:11/09/1960, 62 y.o., female Today's Date: 03/15/2022  PCP: Gayland Curry, MD REFERRING PROVIDER: Ann Lions, PA  END OF SESSION:   PT End of Session - 03/15/22 1717     Visit Number 14    Number of Visits 17    Date for PT Re-Evaluation 04/20/22    Authorization Type [employer] 2023, 60 combined PT/OT/Chiro    Authorization Time Period 12/12/21-02/10/22    Progress Note Due on Visit 10    PT Start Time 1713    PT Stop Time 1756    PT Time Calculation (min) 43 min    Activity Tolerance Patient tolerated treatment well;Patient limited by pain    Behavior During Therapy WFL for tasks assessed/performed              Past Medical History:  Diagnosis Date   High cholesterol    Past Surgical History:  Procedure Laterality Date   [place] GASTRIC SLEEVE RESECTION     Patient Active Problem List   Diagnosis Date Noted   Hx of skin cancer, basal cell 02/07/2018   Obesity 02/07/2018   High cholesterol 12/11/2016   Low vitamin B12 level 12/11/2016   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   Lung nodule seen on imaging study 11/13/2014   DJD (degenerative joint disease) 05/14/2013    REFERRING DIAG:  W38.882 (ICD-10-CM) - S/P arthroscopy of left shoulder  Z01.818 (ICD-10-CM) - Encounter for other preprocedural examination    THERAPY DIAG:  Acute pain of left shoulder  Stiffness of left shoulder, not elsewhere classified  Muscle weakness (generalized)  Rationale for Evaluation and Treatment Rehabilitation  PERTINENT HISTORY: Pt is a 62 year old female s/p L shoulder arthroscopy with biceps tenodesis, resection of adhesions, SAD, DCE, debridement. Patient reports no post-op complications. Pt reports post-op pain is better than she anticipated; pt reports mild pain presently. Patient reports no instructions yet on  exercises to do with shoulder. Pt has weaned from opioid analgesic pain medicine; pt alternates Tylenol and Ibuprofen as needed. Pt denies recent physical trauma. Pt had emergency hemicholectomy earlier this year with good post-op recovery. Pt has been compliant with sling use as directed by her surgeon.       Patient Goals: Patient wants to return to golf, being able to remodel her home/paint, able to reach above head     PRECAUTIONS: No active biceps x 6 weeks, no resisted elbow flexion or supination, A/AAROM at 4 weeks post-op   DOS: 11/24/21   SUBJECTIVE:  SUBJECTIVE STATEMENT:  Patient reports tolerating last session well. She reports notable soreness after ROM work with her R shoulder. Pt reports compliance with HEP, focusing on shoulder ROM without notable loading.    PAIN:  Are you having pain? Pt reports mild soreness at arrival to PT   OBJECTIVE: (objective measures completed at initial evaluation unless otherwise dated)   Patient Surveys  Eval: FOTO: 29, predicted score of 60   Posture Mild forward head, protracted scapulae   AROM            AROM (Normal range in degrees) AROM 12/13/2021 AROM 01/30/22 AROM 03/13/22    Right Left Right Left Right Left  Shoulder          Flexion   Deferred WNL 127 WFL 128  Extension   Deferred WNL 72 WFL 64  Abduction   Deferred WNL 133 WFL 121  External Rotation   Deferred WNL 40 WFL 84  Internal Rotation   Deferred WNL 68 WFL 70  Hands Behind Head   Deferred      Hands Behind Back   Deferred                 Elbow          Flexion          Extension          Pronation          Supination          (* = pain; Blank rows = not tested)     PROM           PROM (Normal range in degrees) PROM 12/13/2021 PROM 01/30/22 PROM 01/30/22 PROM 03/13/2022     Right Left Right Left Left  Shoulder         Flexion   50 WNL 145 159  Extension         Abduction      110 130  External Rotation   15 WNL 50 62  Internal Rotation   45 WNL WNL WNL  Hands Behind Head         Hands Behind Back                   Elbow         Flexion   WNL  WNL   Extension   -10  0   Pronation   WNL  WNL   Supination   WNL  WNL   (* = pain; Blank rows = not tested)     LE MMT: **MMT deferred on 03/13/22 due to recent hernia repair and post-op restrictions MMT (out of 5) Right 12/13/2021 Left 12/13/2021 Right 01/30/22 Left 01/30/22           Shoulder     Flexion     5 4  Extension        Abduction     5 4  External rotation     5 4-  Internal rotation     5 4+  Horizontal abduction        Horizontal adduction        Lower Trapezius        Rhomboids                 Elbow    Flexion     5 4  Extension     5 4  Pronation        Supination  Wrist    Flexion        Extension        Radial deviation        Ulnar deviation                 (* = pain; Blank rows = not tested)    Palpation   Location LEFT  RIGHT           Subocciptials      Cervical paraspinals      Upper Trapezius 0    Levator Scapulae      Rhomboid Major/Minor      Sternoclavicular joint 0    Acromioclavicular joint 2    Coracoid process 2    Bicipital groove 2    Supraspinatus 1    Infraspinatus 0    Subscapularis      Teres Minor      Teres Major      Pectoralis Major 1    Pectoralis Minor      Anterior Deltoid 1    Lateral Deltoid 1    Posterior Deltoid 0    Latissimus Dorsi      Sternocleidomastoid      (Blank rows = not tested) Graded on 0-4 scale (0 = no pain, 1 = pain, 2 = pain with wincing/grimacing/flinching, 3 = pain with withdrawal, 4 = unwilling to allow palpation), (Blank rows = not tested)            TODAY'S TREATMENT    03/15/2022   FOTO UPDATE OBTAINED*    Manual Therapy - for R shoulder ROM and to prevent R shoulder  stiffness   L shoulder PROM into flexion, abduction, ER and IR as tolerated by patient with gentle overpressure into end-ROM; x 15 minutes Gentle GHJ mobilizations at gr I-II, inferior and A-P for 1x30 sec each Glenohumeral mobilizations  at gr III for improved mobility, inferior and posterior; 1x30 sec each    *not today* L elbow PROM for flexion/extension and pronation/supination; x 1 minutes Gentle STM to L biceps, anterior and middle deltoid; x 2 minutes     Therapeutic Exercise - for shoulder complex ROM as needed for ability to perform reaching and self-care/ADLs  Upper body ergometer, 2 minutes forward, 2 minutes backward - for tissue warm-up to improve muscle performance, improved soft tissue mobility/extensibility - subjective information gathered during this billed time   Supine, wand ER AAROM, with elbow propped to 90 deg on table; 2x10  Supine flexion AROM; 2x10 Functional IR (hand behind back) AAROM with strap; x10, 5 sec hold   Sidelying ER AROM; 2x10  Finger ladder; x5, 5 sec for flexion and abduction   PATIENT EDUCATION: Discussed holding on resistance drills/strengthening due to post-operative status (s/p hernia repair 03/02/22). Discussed following up with surgeon tomorrow on activity restrictions still in place after hernia repair.    Cold pack (unbilled) - for anti-inflammatory and analgesic effect as needed for reduced pain and improved ability to participate in active PT intervention, along R shoulder with R elbow propped on pillow, pt seated;  x 5 minutes    *not today* Sidelying shoulder abduction AROM; 1x10 with surgical UE Shoulder isometrics; shoulder flexion, ABD, and extension; x15 each direction, 5 sec - heavy demonstration and verbal/tactile cueing for technique Wand flexion AAROM in supine; 2x10 Ball roll up wall, yellow physioball; x5 Swiss ball roll across table, shoulder abduction; x15, Green physioball  Pulley; flexion, 2 minutes  AAROM Pendulums, forward/backward, side-to-side circles CW and  CCW; x20 each with well arm propped on edge of treatment table Scapular retraction; reviewed Gripping; pink putty; 2 minutes Wrist AROM, flexion/extension and RD/UD; reviewed        PATIENT EDUCATION:  Education details: see above for patient education details Person educated: Patient Education method: Explanation, Handout Education comprehension: verbalized understanding     HOME EXERCISE PROGRAM: Access Code: [email or web address]      ASSESSMENT:   CLINICAL IMPRESSION: Patient has improving  L shoulder ROM, though deficits still remain in end-range flexion, abduction, and ER. Pt does have discomfort following ROM work given remaining L shoulder stiffness. We are holding on resistance drills and significant loading due to post-operative restrictions after hernia repair. Pt is following up with surgeon who completed hernia repair tomorrow. Pt has remaining deficits in L shoulder strength (deltoid, RTC, periscapular mm), active and passive ROM, L shoulder stiffness, postural changes, and anterior shoulder/biceps pain intermittently with ROM work. Patient will benefit from continued skilled therapeutic intervention to address the above deficits as needed for improved function and QoL.     REHAB POTENTIAL: Excellent   CLINICAL DECISION MAKING: Evolving/moderate complexity   EVALUATION COMPLEXITY: Low     GOALS: Goals reviewed with patient? Yes   SHORT TERM GOALS: Target date: 01/10/2022   Pt will be independent with HEP to improve strength and decrease neck pain to improve pain-free function at home and work. Baseline: 12/12/21: Baseline HEP initiated; 01/11/22: HEP recently updated  Goal status: Achieved;      LONG TERM GOALS: Target date: 02/07/2022   Pt will increase FOTO to at least 60 to demonstrate significant improvement in function at home and work related to neck pain  Baseline: 12/12/21: 29/60   01/11/22: 53/60    03/13/22:FOTO to be obtained next visit Goal status: IN PROGRESS   2.   Pt will have L shoulder AROM at least within 10 degrees of contralateral upper extremity indicative of improved ROM as needed for reaching, overhead work, self-care activities/dressing/grooming Baseline: 12/12/21: NO AROM presently, severely limited PROM in early post-op phase.   01/30/22: Shoulder IR WNL; pt is limited in all other planes of motion at this time    03/13/22: Pt has primary remaining AROM limitations in flexion and abduction Goal status: ON-GOING   3. Pt will improve L shoulder strength to at least 4+/5 or greater for all motions measured as needed for improved capacity to perform lifting and stabilize shoulder during working with power tools to complete home improvement projects and swing golf club       Baseline: 12/12/21: No MMTs due to early post-op status   01/30/22: MMT 4- to 4+      03/13/22: Deferred Goal status: ON-GOING   4. Pt will demonstrate golf swing with sound technique and no reproduction of shoulder pain as needed for return to golfing Baseline: 12/12/21: Unable to complete shoulder AROM or compound motion required for golf swing   01/30/22: Deferred.   03/13/22: Deferred.  Goal status: DEFERRED     PLAN: PT FREQUENCY: 1-2x/week   PT DURATION:  4-6 weeks   PLANNED INTERVENTIONS: Therapeutic exercises, Therapeutic activity, Neuromuscular re-education, Patient/Family education, Joint mobilization, Dry Needling, Electrical stimulation, Cryotherapy, Moist heat, Traction, and Manual therapy   PLAN FOR NEXT SESSION: Obtain FOTO next visit. Continue with shoulder A/AAROM for gradually restoring full ROM. Will hold on resistance drills/strengthening until 6 weeks s/p hernia repair.    Valentina Gu, PT, DPT #[id number]  Ariana Jefferson, PT 03/15/2022, 5:17 PM

## 2022-03-20 ENCOUNTER — Ambulatory Visit: Payer: Managed Care, Other (non HMO) | Admitting: Physical Therapy

## 2022-03-20 DIAGNOSIS — M25512 Pain in left shoulder: Secondary | ICD-10-CM | POA: Diagnosis not present

## 2022-03-20 DIAGNOSIS — M25612 Stiffness of left shoulder, not elsewhere classified: Secondary | ICD-10-CM

## 2022-03-20 DIAGNOSIS — M6281 Muscle weakness (generalized): Secondary | ICD-10-CM

## 2022-03-20 NOTE — Therapy (Signed)
OUTPATIENT PHYSICAL THERAPY TREATMENT  Patient Name: Ariana Jefferson MRN: [id number] DOB:[birth year], 62 y.o., female Today's Date: 03/20/2022  PCP: Leim Fabry, MD REFERRING PROVIDER: Gunnar Fusi, PA  END OF SESSION:   PT End of Session - 03/21/22 0725     Visit Number 15    Number of Visits 17    Date for PT Re-Evaluation 04/20/22    Authorization Type [employer] 2023, 60 combined PT/OT/Chiro    Authorization Time Period 12/12/21-02/10/22    Progress Note Due on Visit 10    PT Start Time 1715    PT Stop Time 1811    PT Time Calculation (min) 56 min    Activity Tolerance Patient tolerated treatment well;Patient limited by pain    Behavior During Therapy WFL for tasks assessed/performed               Past Medical History:  Diagnosis Date   High cholesterol    Past Surgical History:  Procedure Laterality Date   CESAREAN SECTION     CHOLECYSTECTOMY     KNEE SURGERY     LAPAROSCOPIC GASTRIC SLEEVE RESECTION     Patient Active Problem List   Diagnosis Date Noted   Hx of skin cancer, basal cell 02/07/2018   Obesity 02/07/2018   High cholesterol 12/11/2016   Low vitamin B12 level 12/11/2016   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   Lung nodule seen on imaging study 11/13/2014   DJD (degenerative joint disease) 05/14/2013    REFERRING DIAG:  W09.811 (ICD-10-CM) - S/P arthroscopy of left shoulder  Z01.818 (ICD-10-CM) - Encounter for other preprocedural examination    THERAPY DIAG:  Acute pain of left shoulder  Stiffness of left shoulder, not elsewhere classified  Muscle weakness (generalized)  Rationale for Evaluation and Treatment Rehabilitation  PERTINENT HISTORY: Pt is a 62 year old female s/p L shoulder arthroscopy with biceps tenodesis, resection of adhesions, SAD, DCE, debridement. Patient reports no post-op complications. Pt reports post-op pain is better than she anticipated; pt reports mild pain presently. Patient reports no instructions yet on  exercises to do with shoulder. Pt has weaned from opioid analgesic pain medicine; pt alternates Tylenol and Ibuprofen as needed. Pt denies recent physical trauma. Pt had emergency hemicholectomy earlier this year with good post-op recovery. Pt has been compliant with sling use as directed by her surgeon.       Patient Goals: Patient wants to return to golf, being able to remodel her home/paint, able to reach above head     PRECAUTIONS: No active biceps x 6 weeks, no resisted elbow flexion or supination, A/AAROM at 4 weeks post-op   DOS: 11/24/21   SUBJECTIVE:  SUBJECTIVE STATEMENT:  Patient reports no significant symptoms at arrival to PT. She had f/u with surgeon yesterday (for hernia repair) and was informed she may perform light resistance exercises with load less than 10-lb limit. She reports ongoing difficulty with accessing shoulder elevation/abduction/ER ROM.    PAIN:  Are you having pain? Pt reports mild soreness at arrival to PT   OBJECTIVE: (objective measures completed at initial evaluation unless otherwise dated)   Patient Surveys  Eval: FOTO: 29, predicted score of 60   Posture Mild forward head, protracted scapulae   AROM            AROM (Normal range in degrees) AROM 12/13/2021 AROM 01/30/22 AROM 03/13/22    Right Left Right Left Right Left  Shoulder          Flexion   Deferred WNL 127 WFL 128  Extension   Deferred WNL 72 WFL 64  Abduction   Deferred WNL 133 WFL 121  External Rotation   Deferred WNL 40 WFL 84  Internal Rotation   Deferred WNL 68 WFL 70  Hands Behind Head   Deferred      Hands Behind Back   Deferred                 Elbow          Flexion          Extension          Pronation          Supination          (* = pain; Blank rows = not tested)     PROM            PROM (Normal range in degrees) PROM 12/13/2021 PROM 01/30/22 PROM 01/30/22 PROM 03/13/2022    Right Left Right Left Left  Shoulder         Flexion   50 WNL 145 159  Extension         Abduction      110 130  External Rotation   15 WNL 50 62  Internal Rotation   45 WNL WNL WNL  Hands Behind Head         Hands Behind Back                   Elbow         Flexion   WNL  WNL   Extension   -10  0   Pronation   WNL  WNL   Supination   WNL  WNL   (* = pain; Blank rows = not tested)     LE MMT: **MMT deferred on 03/13/22 due to recent hernia repair and post-op restrictions MMT (out of 5) Right 12/13/2021 Left 12/13/2021 Right 01/30/22 Left 01/30/22           Shoulder     Flexion     5 4  Extension        Abduction     5 4  External rotation     5 4-  Internal rotation     5 4+  Horizontal abduction        Horizontal adduction        Lower Trapezius        Rhomboids                 Elbow    Flexion     5 4  Extension     5 4  Pronation        Supination                 Wrist    Flexion        Extension        Radial deviation        Ulnar deviation                 (* = pain; Blank rows = not tested)    Palpation   Location LEFT  RIGHT           Subocciptials      Cervical paraspinals      Upper Trapezius 0    Levator Scapulae      Rhomboid Major/Minor      Sternoclavicular joint 0    Acromioclavicular joint 2    Coracoid process 2    Bicipital groove 2    Supraspinatus 1    Infraspinatus 0    Subscapularis      Teres Minor      Teres Major      Pectoralis Major 1    Pectoralis Minor      Anterior Deltoid 1    Lateral Deltoid 1    Posterior Deltoid 0    Latissimus Dorsi      Sternocleidomastoid      (Blank rows = not tested) Graded on 0-4 scale (0 = no pain, 1 = pain, 2 = pain with wincing/grimacing/flinching, 3 = pain with withdrawal, 4 = unwilling to allow palpation), (Blank rows = not tested)            TODAY'S TREATMENT    03/20/2022     Manual Therapy - for R shoulder ROM and to prevent R shoulder stiffness   L shoulder PROM into flexion, abduction, ER and IR as tolerated by patient with gentle overpressure into end-ROM; x 20 minutes Gentle GHJ mobilizations at gr I-II, inferior and A-P for 2x30 sec each Glenohumeral mobilizations  at gr III for improved mobility, inferior and posterior; 2x30 sec each    *not today* L elbow PROM for flexion/extension and pronation/supination; x 1 minutes Gentle STM to L biceps, anterior and middle deltoid; x 2 minutes     Therapeutic Exercise - for shoulder complex ROM as needed for ability to perform reaching and self-care/ADLs  Upper body ergometer, 2 minutes forward, 2 minutes backward - for tissue warm-up to improve muscle performance, improved soft tissue mobility/extensibility - subjective information gathered during this billed time   Supine flexion AROM; 2x10 Sidelying ER AROM; 2x10  Standing, wand ER AAROM, with elbow propped to 90 deg on handrail (on staircase) in center of gym 2x10  Functional IR (hand behind back) AAROM with strap; x10, 5 sec hold    Finger ladder; x5, 5 sec for flexion today   PATIENT EDUCATION: Discussed continuing with resistance drills with future PT visits with primary focus on ROM restoration at this time to prevent stiffness    Cold pack (unbilled) - for anti-inflammatory and analgesic effect as needed for reduced pain and improved ability to participate in active PT intervention, along R shoulder with R elbow propped on pillow, pt seated;  x 5 minutes    *not today* Sidelying shoulder abduction AROM; 1x10 with surgical UE Shoulder isometrics; shoulder flexion, ABD, and extension; x15 each direction, 5 sec - heavy demonstration and verbal/tactile cueing for technique Wand flexion AAROM in supine; 2x10 Ball roll up wall, yellow physioball; x5 Swiss ball roll across  table, shoulder abduction; x15, Green physioball  Pulley; flexion, 2  minutes AAROM Pendulums, forward/backward, side-to-side circles CW and CCW; x20 each with well arm propped on edge of treatment table Scapular retraction; reviewed Gripping; pink putty; 2 minutes Wrist AROM, flexion/extension and RD/UD; reviewed        PATIENT EDUCATION:  Education details: see above for patient education details Person educated: Patient Education method: Explanation, Handout Education comprehension: verbalized understanding     HOME EXERCISE PROGRAM: Access Code: [email or web address]      ASSESSMENT:   CLINICAL IMPRESSION: Patient has been cleared for progressing into light resistance drills with load < 10 lbs. She has ongoing ROM deficits, though shoulder elevation and ER is markedly improving since the previous week. She needs further work on functional IR, GHJ ER, and shoulder elevation. Pt demonstrates good faith effort with exercise to address remaining ROM deficits, but she does have notable discomfort with movement into end-range ER and ABD. Pt has remaining deficits in L shoulder strength (deltoid, RTC, periscapular mm), active and passive ROM, L shoulder stiffness, postural changes, and anterior shoulder/biceps pain intermittently with ROM work. Patient will benefit from continued skilled therapeutic intervention to address the above deficits as needed for improved function and QoL.     REHAB POTENTIAL: Excellent   CLINICAL DECISION MAKING: Evolving/moderate complexity   EVALUATION COMPLEXITY: Low     GOALS: Goals reviewed with patient? Yes   SHORT TERM GOALS: Target date: 01/10/2022   Pt will be independent with HEP to improve strength and decrease neck pain to improve pain-free function at home and work. Baseline: 12/12/21: Baseline HEP initiated; 01/11/22: HEP recently updated  Goal status: Achieved;      LONG TERM GOALS: Target date: 02/07/2022   Pt will increase FOTO to at least 60 to demonstrate significant improvement in function at home and work  related to neck pain  Baseline: 12/12/21: 29/60   01/11/22: 53/60   03/13/22:FOTO to be obtained next visit Goal status: IN PROGRESS   2.   Pt will have L shoulder AROM at least within 10 degrees of contralateral upper extremity indicative of improved ROM as needed for reaching, overhead work, self-care activities/dressing/grooming Baseline: 12/12/21: NO AROM presently, severely limited PROM in early post-op phase.   01/30/22: Shoulder IR WNL; pt is limited in all other planes of motion at this time    03/13/22: Pt has primary remaining AROM limitations in flexion and abduction Goal status: ON-GOING   3. Pt will improve L shoulder strength to at least 4+/5 or greater for all motions measured as needed for improved capacity to perform lifting and stabilize shoulder during working with power tools to complete home improvement projects and swing golf club       Baseline: 12/12/21: No MMTs due to early post-op status   01/30/22: MMT 4- to 4+      03/13/22: Deferred Goal status: ON-GOING   4. Pt will demonstrate golf swing with sound technique and no reproduction of shoulder pain as needed for return to golfing Baseline: 12/12/21: Unable to complete shoulder AROM or compound motion required for golf swing   01/30/22: Deferred.   03/13/22: Deferred.  Goal status: DEFERRED     PLAN: PT FREQUENCY: 1-2x/week   PT DURATION:  4-6 weeks   PLANNED INTERVENTIONS: Therapeutic exercises, Therapeutic activity, Neuromuscular re-education, Patient/Family education, Joint mobilization, Dry Needling, Electrical stimulation, Cryotherapy, Moist heat, Traction, and Manual therapy   PLAN FOR NEXT SESSION: Obtain FOTO next visit. Continue with shoulder A/AAROM for  gradually restoring full ROM. Pt may initiate light resistance exercise with load < 10 lbs.    Valentina Gu, PT, DPT [id number]  Eilleen Kempf, PT 03/21/2022, 7:25 AM

## 2022-03-21 ENCOUNTER — Encounter: Payer: Self-pay | Admitting: Physical Therapy

## 2022-03-22 ENCOUNTER — Encounter: Payer: Self-pay | Admitting: Physical Therapy

## 2022-03-22 ENCOUNTER — Ambulatory Visit: Payer: Managed Care, Other (non HMO) | Admitting: Physical Therapy

## 2022-03-22 DIAGNOSIS — M25512 Pain in left shoulder: Secondary | ICD-10-CM | POA: Diagnosis not present

## 2022-03-22 DIAGNOSIS — M25612 Stiffness of left shoulder, not elsewhere classified: Secondary | ICD-10-CM

## 2022-03-22 DIAGNOSIS — M6281 Muscle weakness (generalized): Secondary | ICD-10-CM

## 2022-03-22 NOTE — Therapy (Signed)
OUTPATIENT PHYSICAL THERAPY TREATMENT  Patient Name: Ariana Jefferson MRN: [id number] DOB:Feb 23, 1960, 62 y.o., female Today's Date: 03/20/2022  PCP: Leim Fabry, MD REFERRING PROVIDER: Gunnar Fusi, PA  END OF SESSION:   PT End of Session - 03/22/22 1822     Visit Number 16    Number of Visits 17    Date for PT Re-Evaluation 04/20/22    Authorization Type [employer] 2023, 60 combined PT/OT/Chiro    Authorization Time Period 12/12/21-02/10/22    Progress Note Due on Visit 10    PT Start Time 1710    PT Stop Time 1805    PT Time Calculation (min) 55 min    Activity Tolerance Patient tolerated treatment well;Patient limited by pain    Behavior During Therapy WFL for tasks assessed/performed                Past Medical History:  Diagnosis Date   High cholesterol    Past Surgical History:  Procedure Laterality Date   CESAREAN SECTION     CHOLECYSTECTOMY     KNEE SURGERY     LAPAROSCOPIC GASTRIC SLEEVE RESECTION     Patient Active Problem List   Diagnosis Date Noted   Hx of skin cancer, basal cell 02/07/2018   Obesity 02/07/2018   High cholesterol 12/11/2016   Low vitamin B12 level 12/11/2016   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   Lung nodule seen on imaging study 11/13/2014   DJD (degenerative joint disease) 05/14/2013    REFERRING DIAG:  B90.383 (ICD-10-CM) - S/P arthroscopy of left shoulder  Z01.818 (ICD-10-CM) - Encounter for other preprocedural examination    THERAPY DIAG:  Acute pain of left shoulder  Stiffness of left shoulder, not elsewhere classified  Muscle weakness (generalized)  Rationale for Evaluation and Treatment Rehabilitation  PERTINENT HISTORY: Pt is a 62 year old female s/p L shoulder arthroscopy with biceps tenodesis, resection of adhesions, SAD, DCE, debridement. Patient reports no post-op complications. Pt reports post-op pain is better than she anticipated; pt reports mild pain presently. Patient reports no instructions yet  on exercises to do with shoulder. Pt has weaned from opioid analgesic pain medicine; pt alternates Tylenol and Ibuprofen as needed. Pt denies recent physical trauma. Pt had emergency hemicholectomy earlier this year with good post-op recovery. Pt has been compliant with sling use as directed by her surgeon.       Patient Goals: Patient wants to return to golf, being able to remodel her home/paint, able to reach above head     PRECAUTIONS: No active biceps x 6 weeks, no resisted elbow flexion or supination, A/AAROM at 4 weeks post-op   DOS: 11/24/21   SUBJECTIVE:  SUBJECTIVE STATEMENT: Patient reports some stiffness on arrival. Pt reports that she was sore after the last visit but was able to manage symptoms well. Pt states concern during AAROM abduction with a dowel when her elbow buckled during the exercise and had no increase in pain. She reports ongoing difficulty with accessing shoulder elevation/abduction/ER ROM.    PAIN:  Are you having pain? Pt reports mild soreness at arrival to PT   OBJECTIVE: (objective measures completed at initial evaluation unless otherwise dated)   Patient Surveys  Eval: FOTO: 29, predicted score of 60   Posture Mild forward head, protracted scapulae   AROM            AROM (Normal range in degrees) AROM 12/13/2021 AROM 01/30/22 AROM 03/13/22    Right Left Right Left Right Left  Shoulder          Flexion   Deferred WNL 127 WFL 128  Extension   Deferred WNL 72 WFL 64  Abduction   Deferred WNL 133 WFL 121  External Rotation   Deferred WNL 40 WFL 84  Internal Rotation   Deferred WNL 68 WFL 70  Hands Behind Head   Deferred      Hands Behind Back   Deferred                 Elbow          Flexion          Extension          Pronation          Supination          (* =  pain; Blank rows = not tested)     PROM           PROM (Normal range in degrees) PROM 12/13/2021 PROM 01/30/22 PROM 01/30/22 PROM 03/13/2022    Right Left Right Left Left  Shoulder         Flexion   50 WNL 145 159  Extension         Abduction      110 130  External Rotation   15 WNL 50 62  Internal Rotation   45 WNL WNL WNL  Hands Behind Head         Hands Behind Back                   Elbow         Flexion   WNL  WNL   Extension   -10  0   Pronation   WNL  WNL   Supination   WNL  WNL   (* = pain; Blank rows = not tested)     LE MMT: **MMT deferred on 03/13/22 due to recent hernia repair and post-op restrictions MMT (out of 5) Right 12/13/2021 Left 12/13/2021 Right 01/30/22 Left 01/30/22           Shoulder     Flexion     5 4  Extension        Abduction     5 4  External rotation     5 4-  Internal rotation     5 4+  Horizontal abduction        Horizontal adduction        Lower Trapezius        Rhomboids                 Elbow    Flexion  5 4  Extension     5 4  Pronation        Supination                 Wrist    Flexion        Extension        Radial deviation        Ulnar deviation                 (* = pain; Blank rows = not tested)    Palpation   Location LEFT  RIGHT           Subocciptials      Cervical paraspinals      Upper Trapezius 0    Levator Scapulae      Rhomboid Major/Minor      Sternoclavicular joint 0    Acromioclavicular joint 2    Coracoid process 2    Bicipital groove 2    Supraspinatus 1    Infraspinatus 0    Subscapularis      Teres Minor      Teres Major      Pectoralis Major 1    Pectoralis Minor      Anterior Deltoid 1    Lateral Deltoid 1    Posterior Deltoid 0    Latissimus Dorsi      Sternocleidomastoid      (Blank rows = not tested) Graded on 0-4 scale (0 = no pain, 1 = pain, 2 = pain with wincing/grimacing/flinching, 3 = pain with withdrawal, 4 = unwilling to allow palpation), (Blank rows = not  tested)            TODAY'S TREATMENT    03/20/2022    Manual Therapy - for R shoulder ROM and to prevent R shoulder stiffness   L shoulder PROM into flexion, abduction, ER and IR as tolerated by patient with gentle overpressure into end-ROM; x 20 minutes Gentle GHJ mobilizations at gr I-II, inferior and A-P for 2x30 sec each Glenohumeral mobilizations  at gr III for improved mobility, inferior and posterior; 2x30 sec each  Gentle STM to L biceps, anterior and middle deltoid; x 2 minutes    *not today* L elbow PROM for flexion/extension and pronation/supination; x 1 minutes    Therapeutic Exercise - for shoulder complex ROM as needed for ability to perform reaching and self-care/ADLs  Upper body ergometer, 2 minutes forward, 2 minutes backward - for tissue warm-up to improve muscle performance, improved soft tissue mobility/extensibility - subjective information gathered during this billed time    Sidelying abduction AROM; 2x10  Standing, wand ER AAROM, with elbow propped to 90 deg on handrail (on staircase) in center of gym; 2x10    Standing, wand Abduction AAROM; 2x10 with 5 sec hold at top of range    - moderate cueing on staying in true frontal plane of motion and maintaining neutral trunk position    *not today* Supine flexion AROM; 2x10 Functional IR (hand behind back) AAROM with strap; x10, 5 sec hold   Finger ladder; x5, 5 sec for flexion today   PATIENT EDUCATION: Discussed continuing with resistance drills with future PT visits with primary focus on ROM restoration at this time to prevent stiffness    Cold pack (unbilled) - for anti-inflammatory and analgesic effect as needed for reduced pain and improved ability to participate in active PT intervention, along R shoulder with R elbow propped on pillow, pt seated;  x 5 minutes    *  not today* Sidelying shoulder abduction AROM; 1x10 with surgical UE Shoulder isometrics; shoulder flexion, ABD, and extension;  x15 each direction, 5 sec - heavy demonstration and verbal/tactile cueing for technique Wand flexion AAROM in supine; 2x10 Ball roll up wall, yellow physioball; x5 Swiss ball roll across table, shoulder abduction; x15, Green physioball  Pulley; flexion, 2 minutes AAROM Pendulums, forward/backward, side-to-side circles CW and CCW; x20 each with well arm propped on edge of treatment table Scapular retraction; reviewed Gripping; pink putty; 2 minutes Wrist AROM, flexion/extension and RD/UD; reviewed        PATIENT EDUCATION:  Education details: see above for patient education details Person educated: Patient Education method: Explanation, Handout Education comprehension: verbalized understanding     HOME EXERCISE PROGRAM: Access Code: [email or web address]      ASSESSMENT:   CLINICAL IMPRESSION: Patient had difficulty with standing shoulder abduction with a wand and required moderate cueing for neutral trunk position and staying in the frontal plane of motion. Pt tolerated PROM well but felt more easing with AAROM with arm propped for shoulder ER. Patient has been cleared for progressing into light resistance drills with load < 10 lbs. She has ongoing ROM deficits, though shoulder elevation and ER is markedly improving since the previous week. She needs further work on functional IR, GHJ ER, and shoulder elevation. Pt demonstrates good faith effort with exercise to address remaining ROM deficits, but she does have notable discomfort with movement into end-range ER and ABD. Pt has remaining deficits in L shoulder strength (deltoid, RTC, periscapular mm), active and passive ROM, L shoulder stiffness, postural changes, and anterior shoulder/biceps pain intermittently with ROM work. Patient will benefit from continued skilled therapeutic intervention to address the above deficits as needed for improved function and QoL.     REHAB POTENTIAL: Excellent   CLINICAL DECISION MAKING: Evolving/moderate  complexity   EVALUATION COMPLEXITY: Low     GOALS: Goals reviewed with patient? Yes   SHORT TERM GOALS: Target date: 01/10/2022   Pt will be independent with HEP to improve strength and decrease neck pain to improve pain-free function at home and work. Baseline: 12/12/21: Baseline HEP initiated; 01/11/22: HEP recently updated  Goal status: Achieved;      LONG TERM GOALS: Target date: 02/07/2022   Pt will increase FOTO to at least 60 to demonstrate significant improvement in function at home and work related to neck pain  Baseline: 12/12/21: 29/60   01/11/22: 53/60   03/13/22:FOTO to be obtained next visit Goal status: IN PROGRESS   2.   Pt will have L shoulder AROM at least within 10 degrees of contralateral upper extremity indicative of improved ROM as needed for reaching, overhead work, self-care activities/dressing/grooming Baseline: 12/12/21: NO AROM presently, severely limited PROM in early post-op phase.   01/30/22: Shoulder IR WNL; pt is limited in all other planes of motion at this time    03/13/22: Pt has primary remaining AROM limitations in flexion and abduction Goal status: ON-GOING   3. Pt will improve L shoulder strength to at least 4+/5 or greater for all motions measured as needed for improved capacity to perform lifting and stabilize shoulder during working with power tools to complete home improvement projects and swing golf club       Baseline: 12/12/21: No MMTs due to early post-op status   01/30/22: MMT 4- to 4+      03/13/22: Deferred Goal status: ON-GOING   4. Pt will demonstrate golf swing with sound technique and no reproduction of  shoulder pain as needed for return to golfing Baseline: 12/12/21: Unable to complete shoulder AROM or compound motion required for golf swing   01/30/22: Deferred.   03/13/22: Deferred.  Goal status: DEFERRED     PLAN: PT FREQUENCY: 1-2x/week   PT DURATION:  4-6 weeks   PLANNED INTERVENTIONS: Therapeutic exercises, Therapeutic  activity, Neuromuscular re-education, Patient/Family education, Joint mobilization, Dry Needling, Electrical stimulation, Cryotherapy, Moist heat, Traction, and Manual therapy   PLAN FOR NEXT SESSION: Obtain FOTO next visit. Continue with shoulder A/AAROM for gradually restoring full ROM. Pt may initiate light resistance exercise with load < 10 lbs.    Valentina Gu, PT, DPT [phone]  Vonna Drafts, Student-PT 03/22/2022, 6:24 PM

## 2022-03-27 ENCOUNTER — Ambulatory Visit: Payer: Managed Care, Other (non HMO) | Attending: Physician Assistant | Admitting: Physical Therapy

## 2022-03-27 ENCOUNTER — Encounter: Payer: Self-pay | Admitting: Physical Therapy

## 2022-03-27 DIAGNOSIS — M6281 Muscle weakness (generalized): Secondary | ICD-10-CM | POA: Diagnosis present

## 2022-03-27 DIAGNOSIS — M25612 Stiffness of left shoulder, not elsewhere classified: Secondary | ICD-10-CM | POA: Insufficient documentation

## 2022-03-27 DIAGNOSIS — M25512 Pain in left shoulder: Secondary | ICD-10-CM | POA: Diagnosis present

## 2022-03-27 NOTE — Therapy (Unsigned)
OUTPATIENT PHYSICAL THERAPY TREATMENT  Patient Name: Drisana Hui MRN: [id number] DOB:[birth year], 62 y.o., female Today's Date: 03/27/2022   PCP: Gayland Curry, MD REFERRING PROVIDER: Ann Lions, PA  END OF SESSION:   PT End of Session - 03/29/22 0608     Visit Number 17    Number of Visits 30    Date for PT Re-Evaluation 04/20/22    Authorization Type [employer] 2023, 60 combined PT/OT/Chiro    Authorization Time Period 12/12/21-02/10/22    Progress Note Due on Visit 10    PT Start Time 1718    PT Stop Time 1805    PT Time Calculation (min) 47 min    Activity Tolerance Patient tolerated treatment well;Patient limited by pain    Behavior During Therapy [facility] for tasks assessed/performed              Past Medical History:  Diagnosis Date   High cholesterol    Past Surgical History:  Procedure Laterality Date   [place] GASTRIC SLEEVE RESECTION     Patient Active Problem List   Diagnosis Date Noted   Hx of skin cancer, basal cell 02/07/2018   Obesity 02/07/2018   High cholesterol 12/11/2016   Low vitamin B12 level 12/11/2016   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   Lung nodule seen on imaging study 11/13/2014   DJD (degenerative joint disease) 05/14/2013    REFERRING DIAG:  [id number] (ICD-10-CM) - S/P arthroscopy of left shoulder  Z01.818 (ICD-10-CM) - Encounter for other preprocedural examination    THERAPY DIAG:  Acute pain of left shoulder  Stiffness of left shoulder, not elsewhere classified  Muscle weakness (generalized)  Rationale for Evaluation and Treatment Rehabilitation  PERTINENT HISTORY: Pt is a 62 year old female s/p L shoulder arthroscopy with biceps tenodesis, resection of adhesions, SAD, DCE, debridement. Patient reports no post-op complications. Pt reports post-op pain is better than she anticipated; pt reports mild pain presently. Patient reports no instructions yet on  exercises to do with shoulder. Pt has weaned from opioid analgesic pain medicine; pt alternates Tylenol and Ibuprofen as needed. Pt denies recent physical trauma. Pt had emergency hemicholectomy earlier this year with good post-op recovery. Pt has been compliant with sling use as directed by her surgeon.       Patient Goals: Patient wants to return to golf, being able to remodel her home/paint, able to reach above head     PRECAUTIONS: No active biceps x 6 weeks, no resisted elbow flexion or supination, A/AAROM at 4 weeks post-op   DOS: 11/24/21   SUBJECTIVE:  SUBJECTIVE STATEMENT: Patient had follow-up with physician earlier today, and pt states that there were no major concerns at the appointment. Pt reports minimal pain at arrival, but she is concerned with ongoing shoulder stiffness. Pt is compliant with home program.    PAIN:  Are you having pain? Pt reports mild soreness at arrival to PT   OBJECTIVE: (objective measures completed at initial evaluation unless otherwise dated)   Patient Surveys  Eval: FOTO: 29, predicted score of 60   Posture Mild forward head, protracted scapulae   AROM            AROM (Normal range in degrees) AROM 12/13/2021 AROM 01/30/22 AROM 03/13/22    Right Left Right Left Right Left  Shoulder          Flexion   Deferred WNL 127 WFL 128  Extension   Deferred WNL 72 WFL 64  Abduction   Deferred WNL 133 WFL 121  External Rotation   Deferred WNL 40 WFL 84  Internal Rotation   Deferred WNL 68 WFL 70  Hands Behind Head   Deferred      Hands Behind Back   Deferred                 Elbow          Flexion          Extension          Pronation          Supination          (* = pain; Blank rows = not tested)     PROM           PROM (Normal range in degrees)  PROM 12/13/2021 PROM 01/30/22 PROM 01/30/22 PROM 03/13/2022    Right Left Right Left Left  Shoulder         Flexion   50 WNL 145 159  Extension         Abduction      110 130  External Rotation   15 WNL 50 62  Internal Rotation   45 WNL WNL WNL  Hands Behind Head         Hands Behind Back                   Elbow         Flexion   WNL  WNL   Extension   -10  0   Pronation   WNL  WNL   Supination   WNL  WNL   (* = pain; Blank rows = not tested)     LE MMT: **MMT deferred on 03/13/22 due to recent hernia repair and post-op restrictions MMT (out of 5) Right 12/13/2021 Left 12/13/2021 Right 01/30/22 Left 01/30/22           Shoulder     Flexion     5 4  Extension        Abduction     5 4  External rotation     5 4-  Internal rotation     5 4+  Horizontal abduction        Horizontal adduction        Lower Trapezius        Rhomboids                 Elbow    Flexion     5 4  Extension     5 4  Pronation  Supination                 Wrist    Flexion        Extension        Radial deviation        Ulnar deviation                 (* = pain; Blank rows = not tested)    Palpation   Location LEFT  RIGHT           Subocciptials      Cervical paraspinals      Upper Trapezius 0    Levator Scapulae      Rhomboid Major/Minor      Sternoclavicular joint 0    Acromioclavicular joint 2    Coracoid process 2    Bicipital groove 2    Supraspinatus 1    Infraspinatus 0    Subscapularis      Teres Minor      Teres Major      Pectoralis Major 1    Pectoralis Minor      Anterior Deltoid 1    Lateral Deltoid 1    Posterior Deltoid 0    Latissimus Dorsi      Sternocleidomastoid      (Blank rows = not tested) Graded on 0-4 scale (0 = no pain, 1 = pain, 2 = pain with wincing/grimacing/flinching, 3 = pain with withdrawal, 4 = unwilling to allow palpation), (Blank rows = not tested)            TODAY'S TREATMENT    03/27/2022     Manual Therapy - for R  shoulder ROM and to prevent R shoulder stiffness   L shoulder PROM into flexion, abduction, ER and IR as tolerated by patient with gentle overpressure into end-ROM; x 15 minutes Gentle GHJ mobilizations at gr I-II, inferior and A-P for 2x30 sec each Glenohumeral mobilizations  at gr III for improved mobility, inferior and posterior; 1x30 sec each  Passive upper trapezius stretching/scapular depression; 3x30 sec     *not today* Gentle STM to L biceps, anterior and middle deltoid; x 2 minutes L elbow PROM for flexion/extension and pronation/supination; x 1 minutes    Therapeutic Exercise - for shoulder complex ROM as needed for ability to perform reaching and self-care/ADLs  Upper body ergometer, 2 minutes forward, 2 minutes backward - for tissue warm-up to improve muscle performance, improved soft tissue mobility/extensibility - subjective information gathered during this billed time   Sidelying abduction AROM; 1x10    Finger ladder; x5, 10 sec for flexion today  Standing, wand ER AAROM, with elbow propped to 90 deg on handrail (on staircase) in center of gym; 2x10    *next visit*   Standing, wand abduction AAROM; 2x10 with 5 sec hold at top of range    - moderate cueing on staying in true frontal plane of motion and maintaining neutral trunk position   *not today* Supine flexion AROM; 2x10 Functional IR (hand behind back) AAROM with strap; x10, 5 sec hold     PATIENT EDUCATION: Discussed continuing with resistance drills with future PT visits with primary focus on ROM restoration at this time to prevent stiffness    Cold pack (unbilled) - for anti-inflammatory and analgesic effect as needed for reduced pain and improved ability to participate in active PT intervention, along R shoulder with R elbow propped on pillow, pt seated;  x 5 minutes    *not today*  Sidelying shoulder abduction AROM; 1x10 with surgical UE Shoulder isometrics; shoulder flexion, ABD, and extension;  x15 each direction, 5 sec - heavy demonstration and verbal/tactile cueing for technique Wand flexion AAROM in supine; 2x10 Ball roll up wall, yellow physioball; x5 Swiss ball roll across table, shoulder abduction; x15, Green physioball  Pulley; flexion, 2 minutes AAROM Pendulums, forward/backward, side-to-side circles CW and CCW; x20 each with well arm propped on edge of treatment table Scapular retraction; reviewed Gripping; pink putty; 2 minutes Wrist AROM, flexion/extension and RD/UD; reviewed        PATIENT EDUCATION:  Education details: see above for patient education details Person educated: Patient Education method: Explanation, Handout Education comprehension: verbalized understanding     HOME EXERCISE PROGRAM: Access Code: [email or web address]      ASSESSMENT:   CLINICAL IMPRESSION: Patient has remaining L shoulder stiffness and needs further work on joint mobility and shoulder complex ROM with most marked deficits in end-range flexion, shoulder abduction, functional IR (hand behind back), and GHJ ER. She does have pain with accessing end-range of motion, but fortunately her pain is generally low at baseline. Pt is able to progress into light resistance drills (having been cleared by surgeon following hernia repair) as AROM progresses toward functional ROM and near symmetry with opposite upper limb. Pt has remaining deficits in L shoulder strength (deltoid, RTC, periscapular mm), active and passive ROM, L shoulder stiffness, postural changes, and anterior shoulder/biceps pain intermittently with ROM work. Patient will benefit from continued skilled therapeutic intervention to address the above deficits as needed for improved function and QoL.     REHAB POTENTIAL: Excellent   CLINICAL DECISION MAKING: Evolving/moderate complexity   EVALUATION COMPLEXITY: Low     GOALS: Goals reviewed with patient? Yes   SHORT TERM GOALS: Target date: 01/10/2022   Pt will be independent with  HEP to improve strength and decrease neck pain to improve pain-free function at home and work. Baseline: 12/12/21: Baseline HEP initiated; 01/11/22: HEP recently updated  Goal status: Achieved;      LONG TERM GOALS: Target date: 02/07/2022   Pt will increase FOTO to at least 60 to demonstrate significant improvement in function at home and work related to neck pain  Baseline: 12/12/21: 29/60   01/11/22: 53/60   03/13/22:FOTO to be obtained next visit Goal status: IN PROGRESS   2.   Pt will have L shoulder AROM at least within 10 degrees of contralateral upper extremity indicative of improved ROM as needed for reaching, overhead work, self-care activities/dressing/grooming Baseline: 12/12/21: NO AROM presently, severely limited PROM in early post-op phase.   01/30/22: Shoulder IR WNL; pt is limited in all other planes of motion at this time    03/13/22: Pt has primary remaining AROM limitations in flexion and abduction Goal status: ON-GOING   3. Pt will improve L shoulder strength to at least 4+/5 or greater for all motions measured as needed for improved capacity to perform lifting and stabilize shoulder during working with power tools to complete home improvement projects and swing golf club       Baseline: 12/12/21: No MMTs due to early post-op status   01/30/22: MMT 4- to 4+      03/13/22: Deferred Goal status: ON-GOING   4. Pt will demonstrate golf swing with sound technique and no reproduction of shoulder pain as needed for return to golfing Baseline: 12/12/21: Unable to complete shoulder AROM or compound motion required for golf swing   01/30/22: Deferred.   03/13/22: Deferred.  Goal status: DEFERRED     PLAN: PT FREQUENCY: 1-2x/week   PT DURATION:  4-6 weeks   PLANNED INTERVENTIONS: Therapeutic exercises, Therapeutic activity, Neuromuscular re-education, Patient/Family education, Joint mobilization, Dry Needling, Electrical stimulation, Cryotherapy, Moist heat, Traction, and Manual  therapy   PLAN FOR NEXT SESSION: Obtain FOTO next visit. Continue with shoulder A/AAROM for gradually restoring full ROM. Pt may initiate light resistance exercise with load < 10 lbs.    Consuela Mimes, PT, DPT #[id number]  Gertie Exon, PT 03/29/2022, 6:08 AM

## 2022-03-29 ENCOUNTER — Ambulatory Visit: Payer: Managed Care, Other (non HMO) | Admitting: Physical Therapy

## 2022-03-29 ENCOUNTER — Encounter: Payer: Self-pay | Admitting: Physical Therapy

## 2022-03-29 DIAGNOSIS — M25612 Stiffness of left shoulder, not elsewhere classified: Secondary | ICD-10-CM

## 2022-03-29 DIAGNOSIS — M25512 Pain in left shoulder: Secondary | ICD-10-CM

## 2022-03-29 DIAGNOSIS — M6281 Muscle weakness (generalized): Secondary | ICD-10-CM

## 2022-03-29 NOTE — Therapy (Signed)
OUTPATIENT PHYSICAL THERAPY TREATMENT  Patient Name: Ariana Jefferson MRN: [id number] DOB:January 23, 1961, 62 y.o., female Today's Date: 03/29/2022   PCP: Gayland Curry, MD REFERRING PROVIDER: Ann Lions, PA  END OF SESSION:   PT End of Session - 03/29/22 1821     Visit Number 18    Number of Visits 30    Date for PT Re-Evaluation 04/20/22    Authorization Type [employer] 2023, 60 combined PT/OT/Chiro    Authorization Time Period 12/12/21-02/10/22    Progress Note Due on Visit 10    PT Start Time 1713    PT Stop Time 1758    PT Time Calculation (min) 45 min    Activity Tolerance Patient tolerated treatment well;Patient limited by pain    Behavior During Therapy [facility] for tasks assessed/performed               Past Medical History:  Diagnosis Date   High cholesterol    Past Surgical History:  Procedure Laterality Date   [place] GASTRIC SLEEVE RESECTION     Patient Active Problem List   Diagnosis Date Noted   Hx of skin cancer, basal cell 02/07/2018   Obesity 02/07/2018   High cholesterol 12/11/2016   Low vitamin B12 level 12/11/2016   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   Lung nodule seen on imaging study 11/13/2014   DJD (degenerative joint disease) 05/14/2013    REFERRING DIAG:  [id number] (ICD-10-CM) - S/P arthroscopy of left shoulder  Z01.818 (ICD-10-CM) - Encounter for other preprocedural examination    THERAPY DIAG:  Acute pain of left shoulder  Stiffness of left shoulder, not elsewhere classified  Muscle weakness (generalized)  Rationale for Evaluation and Treatment Rehabilitation  PERTINENT HISTORY: Pt is a 62 year old female s/p L shoulder arthroscopy with biceps tenodesis, resection of adhesions, SAD, DCE, debridement. Patient reports no post-op complications. Pt reports post-op pain is better than she anticipated; pt reports mild pain presently. Patient reports no instructions yet on  exercises to do with shoulder. Pt has weaned from opioid analgesic pain medicine; pt alternates Tylenol and Ibuprofen as needed. Pt denies recent physical trauma. Pt had emergency hemicholectomy earlier this year with good post-op recovery. Pt has been compliant with sling use as directed by her surgeon.       Patient Goals: Patient wants to return to golf, being able to remodel her home/paint, able to reach above head     PRECAUTIONS: No active biceps x 6 weeks, no resisted elbow flexion or supination, A/AAROM at 4 weeks post-op   DOS: 11/24/21   SUBJECTIVE:  SUBJECTIVE STATEMENT: Patient reports that she has some typical soreness after the past visit. Pt states that she has been doing HEP at least once a day but feels like it has been stiff the past couple of days.    PAIN:  Are you having pain? Pt reports mild soreness at arrival to PT   OBJECTIVE: (objective measures completed at initial evaluation unless otherwise dated)   Patient Surveys  Eval: FOTO: 29, predicted score of 60   Posture Mild forward head, protracted scapulae   AROM            AROM (Normal range in degrees) AROM 12/13/2021 AROM 01/30/22 AROM 03/13/22    Right Left Right Left Right Left  Shoulder          Flexion   Deferred WNL 127 WFL 128  Extension   Deferred WNL 72 WFL 64  Abduction   Deferred WNL 133 WFL 121  External Rotation   Deferred WNL 40 WFL 84  Internal Rotation   Deferred WNL 68 WFL 70  Hands Behind Head   Deferred      Hands Behind Back   Deferred                 Elbow          Flexion          Extension          Pronation          Supination          (* = pain; Blank rows = not tested)     PROM           PROM (Normal range in degrees) PROM 12/13/2021 PROM 01/30/22 PROM 01/30/22 PROM 03/13/2022     Right Left Right Left Left  Shoulder         Flexion   50 WNL 145 159  Extension         Abduction      110 130  External Rotation   15 WNL 50 62  Internal Rotation   45 WNL WNL WNL  Hands Behind Head         Hands Behind Back                   Elbow         Flexion   WNL  WNL   Extension   -10  0   Pronation   WNL  WNL   Supination   WNL  WNL   (* = pain; Blank rows = not tested)     LE MMT: **MMT deferred on 03/13/22 due to recent hernia repair and post-op restrictions MMT (out of 5) Right 12/13/2021 Left 12/13/2021 Right 01/30/22 Left 01/30/22           Shoulder     Flexion     5 4  Extension        Abduction     5 4  External rotation     5 4-  Internal rotation     5 4+  Horizontal abduction        Horizontal adduction        Lower Trapezius        Rhomboids                 Elbow    Flexion     5 4  Extension     5 4  Pronation  Supination                 Wrist    Flexion        Extension        Radial deviation        Ulnar deviation                 (* = pain; Blank rows = not tested)    Palpation   Location LEFT  RIGHT           Subocciptials      Cervical paraspinals      Upper Trapezius 0    Levator Scapulae      Rhomboid Major/Minor      Sternoclavicular joint 0    Acromioclavicular joint 2    Coracoid process 2    Bicipital groove 2    Supraspinatus 1    Infraspinatus 0    Subscapularis      Teres Minor      Teres Major      Pectoralis Major 1    Pectoralis Minor      Anterior Deltoid 1    Lateral Deltoid 1    Posterior Deltoid 0    Latissimus Dorsi      Sternocleidomastoid      (Blank rows = not tested) Graded on 0-4 scale (0 = no pain, 1 = pain, 2 = pain with wincing/grimacing/flinching, 3 = pain with withdrawal, 4 = unwilling to allow palpation), (Blank rows = not tested)            TODAY'S TREATMENT    03/29/2022     Manual Therapy - for R shoulder ROM and to prevent R shoulder stiffness   L shoulder PROM into  flexion, abduction, ER and IR as tolerated by patient with gentle overpressure into end-ROM; x 15 minutes    *not today* Gentle STM to L biceps, anterior and middle deltoid; x 2 minutes L elbow PROM for flexion/extension and pronation/supination; x 1 minutes Passive upper trapezius stretching/scapular depression; 3x30 sec Gentle GHJ mobilizations at gr I-II, inferior and A-P for 2x30 sec each Glenohumeral mobilizations  at gr III for improved mobility, inferior and posterior; 1x30 sec each   Therapeutic Exercise - for shoulder complex ROM as needed for ability to perform reaching and self-care/ADLs  Upper body ergometer, 2 minutes forward, 2 minutes backward - for tissue warm-up to improve muscle performance, improved soft tissue mobility/extensibility - subjective information gathered during this billed time   Supine ER AAROM with wand; 2x10, 5 sec hold    Supine flexion AAROM with wand; x10, 5 sec hold   Banded scapular rows with green theraband; 2x10    *next visit*   Standing, wand abduction AAROM; 2x10 with 5 sec hold at top of range    - moderate cueing on staying in true frontal plane of motion and maintaining neutral trunk position   *not today* Supine flexion AROM; 2x10 Functional IR (hand behind back) AAROM with strap; x10, 5 sec hold   Sidelying abduction AROM; 1x10 Finger ladder; x5, 10 sec for flexion today Standing, wand ER AAROM, with elbow propped to 90 deg on handrail (on staircase) in center of gym; 2x10  PATIENT EDUCATION: Discussed continuing with resistance drills with future PT visits with primary focus on ROM restoration at this time to prevent stiffness    *not today* Sidelying shoulder abduction AROM; 1x10 with surgical UE Shoulder isometrics; shoulder flexion, ABD, and extension; x15 each direction, 5  sec - heavy demonstration and verbal/tactile cueing for technique Wand flexion AAROM in supine; 2x10 Ball roll up wall, yellow physioball; x5 Swiss  ball roll across table, shoulder abduction; x15, Green physioball  Pulley; flexion, 2 minutes AAROM Pendulums, forward/backward, side-to-side circles CW and CCW; x20 each with well arm propped on edge of treatment table Scapular retraction; reviewed Gripping; pink putty; 2 minutes Wrist AROM, flexion/extension and RD/UD; reviewed    Cold pack (unbilled) - for anti-inflammatory and analgesic effect as needed for reduced pain and improved ability to participate in active PT intervention, along R shoulder with R elbow propped on pillow, pt seated;  x 5 minutes    PATIENT EDUCATION:  Education details: see above for patient education details Person educated: Patient Education method: Explanation, Handout Education comprehension: verbalized understanding     HOME EXERCISE PROGRAM: Access Code: [email or web address]      ASSESSMENT:   CLINICAL IMPRESSION: Patient  is able to progress to scapular rows with light resistance band and denies any increase in pain for progression towards functional activities. Pt was able to maintain proper form for scapular row and minimal fatigue. Pt has remaining stiffness in L shoulder and needs more work on multiple end-ranges of motion of the shoulder complex. Pt has marked deficits in end-range flexion, shoulder abduction, functional IR (hand behind back), and GHJ ER. She does have pain with accessing end-range of motion, but fortunately her pain is generally low at baseline. Pt is able to progress into light resistance drills (having been cleared by surgeon following hernia repair) as AROM progresses toward functional ROM and near symmetry with opposite upper limb. Pt has remaining deficits in L shoulder strength (deltoid, RTC, periscapular mm), active and passive ROM, L shoulder stiffness, postural changes, and anterior shoulder/biceps pain intermittently with ROM work. Patient will benefit from continued skilled therapeutic intervention to address the above deficits as  needed for improved function and QoL.     REHAB POTENTIAL: Excellent   CLINICAL DECISION MAKING: Evolving/moderate complexity   EVALUATION COMPLEXITY: Low     GOALS: Goals reviewed with patient? Yes   SHORT TERM GOALS: Target date: 01/10/2022   Pt will be independent with HEP to improve strength and decrease neck pain to improve pain-free function at home and work. Baseline: 12/12/21: Baseline HEP initiated; 01/11/22: HEP recently updated  Goal status: Achieved;      LONG TERM GOALS: Target date: 02/07/2022   Pt will increase FOTO to at least 60 to demonstrate significant improvement in function at home and work related to neck pain  Baseline: 12/12/21: 29/60   01/11/22: 53/60  03/29/22: 56/60 Goal status: IN PROGRESS   2.   Pt will have L shoulder AROM at least within 10 degrees of contralateral upper extremity indicative of improved ROM as needed for reaching, overhead work, self-care activities/dressing/grooming Baseline: 12/12/21: NO AROM presently, severely limited PROM in early post-op phase.   01/30/22: Shoulder IR WNL; pt is limited in all other planes of motion at this time    03/13/22: Pt has primary remaining AROM limitations in flexion and abduction Goal status: ON-GOING   3. Pt will improve L shoulder strength to at least 4+/5 or greater for all motions measured as needed for improved capacity to perform lifting and stabilize shoulder during working with power tools to complete home improvement projects and swing golf club       Baseline: 12/12/21: No MMTs due to early post-op status   01/30/22: MMT 4- to 4+  03/13/22: Deferred Goal status: ON-GOING   4. Pt will demonstrate golf swing with sound technique and no reproduction of shoulder pain as needed for return to golfing Baseline: 12/12/21: Unable to complete shoulder AROM or compound motion required for golf swing   01/30/22: Deferred.   03/13/22: Deferred.  Goal status: DEFERRED     PLAN: PT FREQUENCY:  1-2x/week   PT DURATION:  4-6 weeks   PLANNED INTERVENTIONS: Therapeutic exercises, Therapeutic activity, Neuromuscular re-education, Patient/Family education, Joint mobilization, Dry Needling, Electrical stimulation, Cryotherapy, Moist heat, Traction, and Manual therapy   PLAN FOR NEXT SESSION: Continue with shoulder A/AAROM for gradually restoring full ROM. Pt may initiate light resistance exercise with load < 10 lbs. Scapular rows, supine serratus press-ups, standing abduction AAROM with wand;   Valentina Gu, PT, DPT [id number]  Vonna Drafts, SPT 03/30/2022, 7:47 AM

## 2022-04-03 ENCOUNTER — Ambulatory Visit: Payer: Managed Care, Other (non HMO) | Admitting: Physical Therapy

## 2022-04-03 ENCOUNTER — Encounter: Payer: Self-pay | Admitting: Physical Therapy

## 2022-04-03 DIAGNOSIS — M25612 Stiffness of left shoulder, not elsewhere classified: Secondary | ICD-10-CM

## 2022-04-03 DIAGNOSIS — M25512 Pain in left shoulder: Secondary | ICD-10-CM

## 2022-04-03 DIAGNOSIS — M6281 Muscle weakness (generalized): Secondary | ICD-10-CM

## 2022-04-03 NOTE — Therapy (Signed)
OUTPATIENT PHYSICAL THERAPY TREATMENT  Patient Name: Ariana Jefferson MRN: [id number] DOB:[birth year], 62 y.o., female Today's Date: 04/03/2022    PCP: Gayland Curry, MD REFERRING PROVIDER: Ann Lions, PA  END OF SESSION:   PT End of Session - 04/03/22 1716     Visit Number 19    Number of Visits 30    Date for PT Re-Evaluation 04/20/22    Authorization Type [employer] 2023, 60 combined PT/OT/Chiro    Authorization Time Period 12/12/21-02/10/22    Progress Note Due on Visit 10    PT Start Time [id number]    PT Stop Time 1800    PT Time Calculation (min) 45 min    Activity Tolerance Patient tolerated treatment well;Patient limited by pain    Behavior During Therapy [facility] for tasks assessed/performed                Past Medical History:  Diagnosis Date   High cholesterol    Past Surgical History:  Procedure Laterality Date   [place] GASTRIC SLEEVE RESECTION     Patient Active Problem List   Diagnosis Date Noted   Hx of skin cancer, basal cell 02/07/2018   Obesity 02/07/2018   High cholesterol 12/11/2016   Low vitamin B12 level 12/11/2016   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   Lung nodule seen on imaging study 11/13/2014   DJD (degenerative joint disease) 05/14/2013    REFERRING DIAG:  [id number] (ICD-10-CM) - S/P arthroscopy of left shoulder  Z01.818 (ICD-10-CM) - Encounter for other preprocedural examination    THERAPY DIAG:  Acute pain of left shoulder  Stiffness of left shoulder, not elsewhere classified  Muscle weakness (generalized)  Rationale for Evaluation and Treatment Rehabilitation  PERTINENT HISTORY: Pt is a 62 year old female s/p L shoulder arthroscopy with biceps tenodesis, resection of adhesions, SAD, DCE, debridement. Patient reports no post-op complications. Pt reports post-op pain is better than she anticipated; pt reports mild pain presently. Patient reports no instructions  yet on exercises to do with shoulder. Pt has weaned from opioid analgesic pain medicine; pt alternates Tylenol and Ibuprofen as needed. Pt denies recent physical trauma. Pt had emergency hemicholectomy earlier this year with good post-op recovery. Pt has been compliant with sling use as directed by her surgeon.       Patient Goals: Patient wants to return to golf, being able to remodel her home/paint, able to reach above head     PRECAUTIONS: No active biceps x 6 weeks, no resisted elbow flexion or supination, A/AAROM at 4 weeks post-op   DOS: 11/24/21   SUBJECTIVE:  SUBJECTIVE STATEMENT: Pt reports no major changes and denies pain currently. Pt states that she has been trying to use her left arm more often to improve her overall movement. Pt reports some soreness but is nothing more than usual. Pt states that she took a 69m muscle relaxer (Tizanidine) prior to coming today.    PAIN:  Are you having pain? Pt reports mild soreness at arrival to PT   OBJECTIVE: (objective measures completed at initial evaluation unless otherwise dated)   Patient Surveys  Eval: FOTO: 29, predicted score of 60   Posture Mild forward head, protracted scapulae   AROM            AROM (Normal range in degrees) AROM 12/13/2021 AROM 01/30/22 AROM 03/13/22    Right Left Right Left Right Left  Shoulder          Flexion   Deferred WNL 127 WFL 128  Extension   Deferred WNL 72 WFL 64  Abduction   Deferred WNL 133 WFL 121  External Rotation   Deferred WNL 40 WFL 84  Internal Rotation   Deferred WNL 68 WFL 70  Hands Behind Head   Deferred      Hands Behind Back   Deferred                 Elbow          Flexion          Extension          Pronation          Supination          (* = pain; Blank rows = not tested)     PROM            PROM (Normal range in degrees) PROM 12/13/2021 PROM 01/30/22 PROM 01/30/22 PROM 03/13/2022    Right Left Right Left Left  Shoulder         Flexion   50 WNL 145 159  Extension         Abduction      110 130  External Rotation   15 WNL 50 62  Internal Rotation   45 WNL WNL WNL  Hands Behind Head         Hands Behind Back                   Elbow         Flexion   WNL  WNL   Extension   -10  0   Pronation   WNL  WNL   Supination   WNL  WNL   (* = pain; Blank rows = not tested)     LE MMT: **MMT deferred on 03/13/22 due to recent hernia repair and post-op restrictions MMT (out of 5) Right 12/13/2021 Left 12/13/2021 Right 01/30/22 Left 01/30/22           Shoulder     Flexion     5 4  Extension        Abduction     5 4  External rotation     5 4-  Internal rotation     5 4+  Horizontal abduction        Horizontal adduction        Lower Trapezius        Rhomboids                 Elbow    Flexion     5 4  Extension     5 4  Pronation        Supination                 Wrist    Flexion        Extension        Radial deviation        Ulnar deviation                 (* = pain; Blank rows = not tested)    Palpation   Location LEFT  RIGHT           Subocciptials      Cervical paraspinals      Upper Trapezius 0    Levator Scapulae      Rhomboid Major/Minor      Sternoclavicular joint 0    Acromioclavicular joint 2    Coracoid process 2    Bicipital groove 2    Supraspinatus 1    Infraspinatus 0    Subscapularis      Teres Minor      Teres Major      Pectoralis Major 1    Pectoralis Minor      Anterior Deltoid 1    Lateral Deltoid 1    Posterior Deltoid 0    Latissimus Dorsi      Sternocleidomastoid      (Blank rows = not tested) Graded on 0-4 scale (0 = no pain, 1 = pain, 2 = pain with wincing/grimacing/flinching, 3 = pain with withdrawal, 4 = unwilling to allow palpation), (Blank rows = not tested)            TODAY'S TREATMENT     03/29/2022     Manual Therapy - for R shoulder ROM and to prevent R shoulder stiffness   L shoulder PROM into flexion, abduction, ER and IR as tolerated by patient with gentle overpressure into end-ROM; x 20 minutes  Gentle STM to L biceps, anterior and middle deltoid; x 2 minutes  Gentle GHJ mobilizations at gr I-II, inferior and A-P for 2x30 sec each   *not today* L elbow PROM for flexion/extension and pronation/supination; x 1 minutes Passive upper trapezius stretching/scapular depression; 3x30 sec Glenohumeral mobilizations  at gr III for improved mobility, inferior and posterior; 1x30 sec each   Therapeutic Exercise - for shoulder complex ROM as needed for ability to perform reaching and self-care/ADLs  Upper body ergometer, 2 minutes forward, 2 minutes backward - for tissue warm-up to improve muscle performance, improved soft tissue mobility/extensibility - subjective information gathered during this billed time       Supine flexion AAROM with wand; x10, 5 sec hold Standing, wand ER AAROM, with elbow propped to 90 deg on handrail (on staircase) in center of gym; x10 5 sec hold   Standing, wand abduction AAROM; 1x10 with 5 sec hold at top of range -moderate cueing on staying in true frontal plane of motion and maintaining neutral trunk position   *next visit*   Sidelying Functional IR (hand behind back) AAROM with strap; x10, 5 sec hold     *not today* Banded scapular rows with green theraband; 2x10 Supine flexion AROM; 2x10  Sidelying abduction AROM; 1x10 Finger ladder; x5, 10 sec for flexion today Supine ER AAROM with wand; 2x10, 5 sec hold   PATIENT EDUCATION: Discussed continuing with resistance drills with future PT visits with primary focus on ROM restoration at this time to prevent stiffness    *not today*  Sidelying shoulder abduction AROM; 1x10 with surgical UE Shoulder isometrics; shoulder flexion, ABD, and extension; x15 each direction, 5 sec - heavy  demonstration and verbal/tactile cueing for technique Wand flexion AAROM in supine; 2x10 Ball roll up wall, yellow physioball; x5 Swiss ball roll across table, shoulder abduction; x15, Green physioball  Pulley; flexion, 2 minutes AAROM Pendulums, forward/backward, side-to-side circles CW and CCW; x20 each with well arm propped on edge of treatment table Scapular retraction; reviewed Gripping; pink putty; 2 minutes Wrist AROM, flexion/extension and RD/UD; reviewed    Cold pack (unbilled) - for anti-inflammatory and analgesic effect as needed for reduced pain and improved ability to participate in active PT intervention, along R shoulder with R elbow propped on pillow, pt seated;  x 5 minutes    PATIENT EDUCATION:  Education details: see above for patient education details Person educated: Patient Education method: Explanation, Handout Education comprehension: verbalized understanding     HOME EXERCISE PROGRAM: Access Code: [email or web address]      ASSESSMENT:   CLINICAL IMPRESSION:  Pt has remaining stiffness in L shoulder and needs more work on multiple end-ranges of motion of the shoulder complex. Patient performed readily available PROM with that may have contributed to Tizanidine effect for decreased muscle guarding. Pt has marked deficits in end-range flexion, shoulder abduction, functional IR (hand behind back), and GHJ ER. She does have pain with accessing end-range of motion, but fortunately her pain is generally low at baseline. Pt is able to progress into light resistance drills (having been cleared by surgeon following hernia repair) as AROM progresses toward functional ROM and near symmetry with opposite upper limb. Pt has remaining deficits in L shoulder strength (deltoid, RTC, periscapular mm), active and passive ROM, L shoulder stiffness, postural changes, and anterior shoulder/biceps pain intermittently with ROM work. Patient will benefit from continued skilled therapeutic  intervention to address the above deficits as needed for improved function and QoL.     REHAB POTENTIAL: Excellent   CLINICAL DECISION MAKING: Evolving/moderate complexity   EVALUATION COMPLEXITY: Low     GOALS: Goals reviewed with patient? Yes   SHORT TERM GOALS: Target date: 01/10/2022   Pt will be independent with HEP to improve strength and decrease neck pain to improve pain-free function at home and work. Baseline: 12/12/21: Baseline HEP initiated; 01/11/22: HEP recently updated  Goal status: Achieved;      LONG TERM GOALS: Target date: 02/07/2022   Pt will increase FOTO to at least 60 to demonstrate significant improvement in function at home and work related to neck pain  Baseline: 12/12/21: 29/60   01/11/22: 53/60  03/29/22: 56/60 Goal status: IN PROGRESS   2.   Pt will have L shoulder AROM at least within 10 degrees of contralateral upper extremity indicative of improved ROM as needed for reaching, overhead work, self-care activities/dressing/grooming Baseline: 12/12/21: NO AROM presently, severely limited PROM in early post-op phase.   01/30/22: Shoulder IR WNL; pt is limited in all other planes of motion at this time    03/13/22: Pt has primary remaining AROM limitations in flexion and abduction Goal status: ON-GOING   3. Pt will improve L shoulder strength to at least 4+/5 or greater for all motions measured as needed for improved capacity to perform lifting and stabilize shoulder during working with power tools to complete home improvement projects and swing golf club       Baseline: 12/12/21: No MMTs due to early post-op status   01/30/22: MMT 4- to 4+  03/13/22: Deferred Goal status: ON-GOING   4. Pt will demonstrate golf swing with sound technique and no reproduction of shoulder pain as needed for return to golfing Baseline: 12/12/21: Unable to complete shoulder AROM or compound motion required for golf swing   01/30/22: Deferred.   03/13/22: Deferred.  Goal status:  DEFERRED     PLAN: PT FREQUENCY: 1-2x/week   PT DURATION:  4-6 weeks   PLANNED INTERVENTIONS: Therapeutic exercises, Therapeutic activity, Neuromuscular re-education, Patient/Family education, Joint mobilization, Dry Needling, Electrical stimulation, Cryotherapy, Moist heat, Traction, and Manual therapy   PLAN FOR NEXT SESSION: Continue with shoulder A/AAROM for gradually restoring full ROM. Pt may initiate light resistance exercise with load < 10 lbs. Scapular rows, supine serratus press-ups, standing abduction AAROM with wand;   Valentina Gu, PT, DPT [phone]  Vonna Drafts, SPT 04/03/2022, 6:14 PM

## 2022-04-05 ENCOUNTER — Ambulatory Visit: Payer: Managed Care, Other (non HMO) | Admitting: Physical Therapy

## 2022-04-05 ENCOUNTER — Encounter: Payer: Self-pay | Admitting: Physical Therapy

## 2022-04-05 DIAGNOSIS — M25512 Pain in left shoulder: Secondary | ICD-10-CM | POA: Diagnosis not present

## 2022-04-05 DIAGNOSIS — M6281 Muscle weakness (generalized): Secondary | ICD-10-CM

## 2022-04-05 DIAGNOSIS — M25612 Stiffness of left shoulder, not elsewhere classified: Secondary | ICD-10-CM

## 2022-04-05 NOTE — Therapy (Signed)
OUTPATIENT PHYSICAL THERAPY TREATMENT AND PROGRESS NOTE AND RE-CERTIFICATION  Patient Name: Ariana Jefferson MRN: [id number] DOB:1960/12/05, 62 y.o., female Today's Date: 04/05/2022    PCP: Gayland Curry, MD REFERRING PROVIDER: Ann Lions, PA  END OF SESSION:   PT End of Session - 04/10/22 0616     Visit Number 20    Number of Visits 30    Date for PT Re-Evaluation 04/20/22    Authorization Type [employer] 2023, 60 combined PT/OT/Chiro    Authorization Time Period 12/12/21-02/10/22    Progress Note Due on Visit 10    PT Start Time 1628    PT Stop Time 1721    PT Time Calculation (min) 53 min    Activity Tolerance Patient tolerated treatment well;Patient limited by pain    Behavior During Therapy [facility] for tasks assessed/performed                  Past Medical History:  Diagnosis Date   High cholesterol    Past Surgical History:  Procedure Laterality Date   [place] GASTRIC SLEEVE RESECTION     Patient Active Problem List   Diagnosis Date Noted   Hx of skin cancer, basal cell 02/07/2018   Obesity 02/07/2018   High cholesterol 12/11/2016   Low vitamin B12 level 12/11/2016   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   Lung nodule seen on imaging study 11/13/2014   DJD (degenerative joint disease) 05/14/2013    REFERRING DIAG:  [id number] (ICD-10-CM) - S/P arthroscopy of left shoulder  Z01.818 (ICD-10-CM) - Encounter for other preprocedural examination    THERAPY DIAG:  Acute pain of left shoulder  Stiffness of left shoulder, not elsewhere classified  Muscle weakness (generalized)  Rationale for Evaluation and Treatment Rehabilitation  PERTINENT HISTORY: Pt is a 62 year old female s/p L shoulder arthroscopy with biceps tenodesis, resection of adhesions, SAD, DCE, debridement. Patient reports no post-op complications. Pt reports post-op pain is better than she anticipated; pt reports mild pain  presently. Patient reports no instructions yet on exercises to do with shoulder. Pt has weaned from opioid analgesic pain medicine; pt alternates Tylenol and Ibuprofen as needed. Pt denies recent physical trauma. Pt had emergency hemicholectomy earlier this year with good post-op recovery. Pt has been compliant with sling use as directed by her surgeon.       Patient Goals: Patient wants to return to golf, being able to remodel her home/paint, able to reach above head     PRECAUTIONS: No active biceps x 6 weeks, no resisted elbow flexion or supination, A/AAROM at 4 weeks post-op   DOS: 11/24/21   SUBJECTIVE:  SUBJECTIVE STATEMENT:  Pt describes remaining limitations with reaching up overhead and behind her back. Pt feels like she is about 80-85% back to normal. Pt reports soreness and stiffness in the last couple of days. Pt describes that she used her pulley's in seated position to increase AAROM with abduction.    PAIN:  Are you having pain? Pt reports mild soreness at arrival to PT   OBJECTIVE: (objective measures completed at initial evaluation unless otherwise dated)   Patient Surveys  Eval: FOTO: 29, predicted score of 60   Posture Mild forward head, protracted scapulae   AROM              AROM (Normal range in degrees) AROM 12/13/2021 AROM 01/30/22 AROM 03/13/22 AROM 04/05/22    Right Left Right Left Right Left Right Left  Shoulder            Flexion   Deferred WNL 127 WFL 128  119  Extension   Deferred WNL 72 WFL 64    Abduction   Deferred WNL 133 WFL 121  132  External Rotation   Deferred WNL 40 WFL 84  80 (arm at side) 82 (arm at 90 deg abduction)  Internal Rotation   Deferred WNL 68 WFL 70  WFL (arm at side) 38 (at 90 abduction)  Hands Behind Head   Deferred        Hands Behind Back    Deferred                     Elbow            Flexion            Extension            Pronation            Supination            (* = pain; Blank rows = not tested)     PROM            PROM (Normal range in degrees) PROM 12/13/2021 PROM 01/30/22 PROM 01/30/22 PROM 03/13/22 PROM 04/05/22    Right Left Right Left Left Left  Shoulder          Flexion   50 WNL 145 159 145  Extension          Abduction      110 130 110  External Rotation   15 WNL 50 62 64  Internal Rotation   45 WNL WNL WNL 67  Hands Behind Head          Hands Behind Back                     Elbow          Flexion   WNL  WNL    Extension   -10  0    Pronation   WNL  WNL    Supination   WNL  WNL    (* = pain; Blank rows = not tested)     LE MMT: MMT (out of 5) Right 12/13/2021 Left 12/13/2021 Right 01/30/22 Left 01/30/22 Left 04/05/22            Shoulder      Flexion     5 4 4  $ Extension         Abduction     5 4 4  $ External rotation     5 4- 4  Internal rotation  5 4+ 4*  Horizontal abduction         Horizontal adduction         Lower Trapezius         Rhomboids                   Elbow     Flexion     5 4 4+  Extension     5 4 4  $ Pronation         Supination                   Wrist     Flexion         Extension         Radial deviation         Ulnar deviation                   (* = pain; Blank rows = not tested)    Palpation   Location LEFT  RIGHT           Subocciptials      Cervical paraspinals      Upper Trapezius 0    Levator Scapulae      Rhomboid Major/Minor      Sternoclavicular joint 0    Acromioclavicular joint 2    Coracoid process 2    Bicipital groove 2    Supraspinatus 1    Infraspinatus 0    Subscapularis      Teres Minor      Teres Major      Pectoralis Major 1    Pectoralis Minor      Anterior Deltoid 1    Lateral Deltoid 1    Posterior Deltoid 0    Latissimus Dorsi      Sternocleidomastoid      (Blank rows = not tested) Graded on 0-4 scale (0 =  no pain, 1 = pain, 2 = pain with wincing/grimacing/flinching, 3 = pain with withdrawal, 4 = unwilling to allow palpation), (Blank rows = not tested)            TODAY'S TREATMENT    04/05/2022  Manual Therapy - for R shoulder ROM and to prevent R shoulder stiffness   L shoulder PROM into flexion, abduction, ER and IR as tolerated by patient with gentle overpressure into end-ROM; x 10 minutes  Gentle STM to L biceps, anterior and middle deltoid; x 2 minutes  Gentle GHJ mobilizations at gr I-II, inferior and A-P for 2x30 sec each   *not today* L elbow PROM for flexion/extension and pronation/supination; x 1 minutes Passive upper trapezius stretching/scapular depression; 3x30 sec Glenohumeral mobilizations  at gr III for improved mobility, inferior and posterior; 1x30 sec each     Therapeutic Exercise - for shoulder complex ROM as needed for ability to perform reaching and self-care/ADLs  *GOAL UPDATE PERFORMED   Upper body ergometer, 2 minutes forward, 2 minutes backward - for tissue warm-up to improve muscle performance, improved soft tissue mobility/extensibility - subjective information gathered during this billed time     Supine flexion AAROM with wand; 2x10, 5 sec hold    Supine flexion AROM; 20x  Standing, wand ER AAROM, with elbow propped to 90 deg on handrail (on staircase) in center of gym; x10 5 sec hold   Standing, wand abduction AAROM; 1x10 with 5 sec hold at top of range -moderate cueing on staying in true frontal plane of motion and maintaining neutral trunk position Standing  serratus slide with foam roll; 1x10 with 5 sec hold at top of range     *next visit*   Sidelying Functional IR (hand behind back) AAROM with strap; x10, 5 sec hold    *not today* Banded scapular rows with green theraband; 2x10 Supine flexion AROM; 2x10 Sidelying abduction AROM; 1x10 Finger ladder; x5, 10 sec for flexion today Supine ER AAROM with wand; 2x10, 5 sec hold   PATIENT  EDUCATION: Discussed continuing with resistance drills with future PT visits with primary focus on ROM restoration at this time to prevent stiffness    *not today* Sidelying shoulder abduction AROM; 1x10 with surgical UE Shoulder isometrics; shoulder flexion, ABD, and extension; x15 each direction, 5 sec - heavy demonstration and verbal/tactile cueing for technique Wand flexion AAROM in supine; 2x10 Ball roll up wall, yellow physioball; x5 Swiss ball roll across table, shoulder abduction; x15, Green physioball  Pulley; flexion, 2 minutes AAROM Pendulums, forward/backward, side-to-side circles CW and CCW; x20 each with well arm propped on edge of treatment table Scapular retraction; reviewed Gripping; pink putty; 2 minutes Wrist AROM, flexion/extension and RD/UD; reviewed    Cold pack (unbilled) - for anti-inflammatory and analgesic effect as needed for reduced pain and improved ability to participate in active PT intervention, along R shoulder with R elbow propped on pillow, pt seated;  x 5 minutes    PATIENT EDUCATION:  Education details: see above for patient education details Person educated: Patient Education method: Explanation, Handout Education comprehension: verbalized understanding     HOME EXERCISE PROGRAM: Access Code: [email or web address] URL: [email or web address] Date: 04/05/2022 Prepared by: Valentina Gu  Exercises - Supine Shoulder Flexion with Dowel  - 2 x daily - 7 x weekly - 2 sets - 10 reps - Supine Shoulder Flexion Extension Full Range AROM  - 2 x daily - 7 x weekly - 2 sets - 10 reps - Supine Shoulder External Rotation in 45 Degrees Abduction AAROM with Dowel  - 2 x daily - 7 x weekly - 2 sets - 10 reps - Standing Shoulder Internal Rotation Stretch with Towel  - 2 x daily - 7 x weekly - 2 sets - 10 reps - 5sec hold - Shoulder Flexion Wall Slide with Towel  - 2 x daily - 7 x weekly - 2 sets - 5-10 reps - Standing Shoulder Abduction Slides at Wall  - 2  x daily - 7 x weekly - 2 sets - 5-10 reps       ASSESSMENT:   CLINICAL IMPRESSION: Patient has modest change in PROM with remaining deficits. Pt is making slow progression with AROM due to more AAROM and PT will work towards more AROM to assist with marked deficits and functional activities. Pt's MMT has remained constant in past weeks and progression has been slow due to post-hernia surgery protocol. Pt has remaining stiffness in L shoulder ROM and needs more work on multiple end-ranges of motion of the shoulder complex. Pt has marked deficits in end-range flexion, shoulder abduction, functional IR (hand behind back), and GHJ ER. She does have pain with accessing end-range of motion, but fortunately her pain is generally low at baseline. Pt is able to progress into light resistance drills (having been cleared by surgeon following hernia repair) as AROM progresses toward functional ROM and near symmetry with opposite upper limb. Pt has remaining deficits in L shoulder strength (deltoid, RTC, periscapular mm), active and passive ROM, L shoulder stiffness, postural changes, and anterior shoulder/biceps pain intermittently with ROM work. Patient  will benefit from continued skilled therapeutic intervention to address the above deficits as needed for improved function and QoL.     REHAB POTENTIAL: Excellent   CLINICAL DECISION MAKING: Evolving/moderate complexity   EVALUATION COMPLEXITY: Low     GOALS: Goals reviewed with patient? Yes   SHORT TERM GOALS: Target date: 01/10/2022   Pt will be independent with HEP to improve strength and decrease neck pain to improve pain-free function at home and work. Baseline: 12/12/21: Baseline HEP initiated; 01/11/22: HEP recently updated 04/05/22: HEP updated today Goal status: Achieved;      LONG TERM GOALS: Target date: 02/07/2022   Pt will increase FOTO to at least 60 to demonstrate significant improvement in function at home and work related to neck  pain  Baseline: 12/12/21: 29/60   01/11/22: 53/60    03/29/22: 56/60 Goal status: IN PROGRESS   2.   Pt will have L shoulder AROM at least within 10 degrees of contralateral upper extremity indicative of improved ROM as needed for reaching, overhead work, self-care activities/dressing/grooming Baseline: 12/12/21: NO AROM presently, severely limited PROM in early post-op phase.   01/30/22: Shoulder IR WNL; pt is limited in all other planes of motion at this time    03/13/22: Pt has primary remaining AROM limitations in flexion and abduction.     04/05/22: Pt has remaining AROM limitations in flexion, abduction, IR (at 90 abduction), and ER (at 90 abduction),  Goal status: ON-GOING   3. Pt will improve L shoulder strength to at least 4+/5 or greater for all motions measured as needed for improved capacity to perform lifting and stabilize shoulder during working with power tools to complete home improvement projects and swing golf club       Baseline: 12/12/21: No MMTs due to early post-op status   01/30/22: MMT 4- to 4+      03/13/22: Deferred.  04/05/22: Gross strength 4/5 (see chart above) Goal status: ON-GOING   4. Pt will demonstrate golf swing with sound technique and no reproduction of shoulder pain as needed for return to golfing Baseline: 12/12/21: Unable to complete shoulder AROM or compound motion required for golf swing   01/30/22: Deferred.   03/13/22: Deferred. 04/05/22: deferred Goal status: DEFERRED     PLAN: PT FREQUENCY: 1-2x/week   PT DURATION:  4-6 weeks   PLANNED INTERVENTIONS: Therapeutic exercises, Therapeutic activity, Neuromuscular re-education, Patient/Family education, Joint mobilization, Dry Needling, Electrical stimulation, Cryotherapy, Moist heat, Traction, and Manual therapy   PLAN FOR NEXT SESSION: Continue with shoulder A/AAROM for gradually restoring full ROM. Pt may initiate light resistance exercise with load < 10 lbs. Scapular rows, supine serratus press-ups,  standing abduction AAROM with wand;   Valentina Gu, PT, DPT [phone]  Vonna Drafts, SPT 04/10/2022, 6:16 AM

## 2022-04-07 ENCOUNTER — Ambulatory Visit: Admit: 2022-04-07 | Payer: BLUE CROSS/BLUE SHIELD | Primary: Internal Medicine

## 2022-04-10 ENCOUNTER — Ambulatory Visit: Payer: Managed Care, Other (non HMO) | Admitting: Physical Therapy

## 2022-04-12 ENCOUNTER — Ambulatory Visit: Payer: Managed Care, Other (non HMO) | Admitting: Physical Therapy

## 2022-04-12 DIAGNOSIS — M25512 Pain in left shoulder: Secondary | ICD-10-CM | POA: Diagnosis not present

## 2022-04-12 DIAGNOSIS — M6281 Muscle weakness (generalized): Secondary | ICD-10-CM

## 2022-04-12 DIAGNOSIS — M25612 Stiffness of left shoulder, not elsewhere classified: Secondary | ICD-10-CM

## 2022-04-12 NOTE — Therapy (Unsigned)
OUTPATIENT PHYSICAL THERAPY TREATMENT  Patient Name: Ariana Jefferson MRN: [id number] DOB:[birth year], 62 y.o., female Today's Date: 04/12/2022    PCP: Gayland Curry, MD REFERRING PROVIDER: Ann Lions, PA  END OF SESSION:   PT End of Session - 04/12/22 1717     Visit Number 21    Number of Visits 30    Date for PT Re-Evaluation 04/20/22    Authorization Type [employer] 2023, 60 combined PT/OT/Chiro    Authorization Time Period 12/12/21-02/10/22    Progress Note Due on Visit 10    PT Start Time [id number]    PT Stop Time 1809    PT Time Calculation (min) 54 min    Activity Tolerance Patient tolerated treatment well;Patient limited by pain    Behavior During Therapy [facility] for tasks assessed/performed                   Past Medical History:  Diagnosis Date   High cholesterol    Past Surgical History:  Procedure Laterality Date   [place] GASTRIC SLEEVE RESECTION     Patient Active Problem List   Diagnosis Date Noted   Hx of skin cancer, basal cell 02/07/2018   Obesity 02/07/2018   High cholesterol 12/11/2016   Low vitamin B12 level 12/11/2016   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   Lung nodule seen on imaging study 11/13/2014   DJD (degenerative joint disease) 05/14/2013    REFERRING DIAG:  [id number] (ICD-10-CM) - S/P arthroscopy of left shoulder  Z01.818 (ICD-10-CM) - Encounter for other preprocedural examination    THERAPY DIAG:  No diagnosis found.  Rationale for Evaluation and Treatment Rehabilitation  PERTINENT HISTORY: Pt is a 62 year old female s/p L shoulder arthroscopy with biceps tenodesis, resection of adhesions, SAD, DCE, debridement. Patient reports no post-op complications. Pt reports post-op pain is better than she anticipated; pt reports mild pain presently. Patient reports no instructions yet on exercises to do with shoulder. Pt has weaned from opioid analgesic pain medicine; pt  alternates Tylenol and Ibuprofen as needed. Pt denies recent physical trauma. Pt had emergency hemicholectomy earlier this year with good post-op recovery. Pt has been compliant with sling use as directed by her surgeon.       Patient Goals: Patient wants to return to golf, being able to remodel her home/paint, able to reach above head     PRECAUTIONS: No active biceps x 6 weeks, no resisted elbow flexion or supination, A/AAROM at 4 weeks post-op   DOS: 11/24/21   SUBJECTIVE:  SUBJECTIVE STATEMENT: Patient reports starting Prednisone regimen earlier this week. Pt states that she feel less achiness this week and has been trying to use her LUE for more daily activities.     PAIN:  Are you having pain? Pt reports mild soreness at arrival to PT   OBJECTIVE: (objective measures completed at initial evaluation unless otherwise dated)   Patient Surveys  Eval: FOTO: 29, predicted score of 60   Posture Mild forward head, protracted scapulae   AROM              AROM (Normal range in degrees) AROM 12/13/2021 AROM 01/30/22 AROM 03/13/22 AROM 04/05/22    Right Left Right Left Right Left Right Left  Shoulder            Flexion   Deferred WNL 127 WFL 128  119  Extension   Deferred WNL 72 WFL 64    Abduction   Deferred WNL 133 WFL 121  132  External Rotation   Deferred WNL 40 WFL 84  80 (arm at side) 82 (arm at 90 deg abduction)  Internal Rotation   Deferred WNL 68 WFL 70  WFL (arm at side) 38 (at 90 abduction)  Hands Behind Head   Deferred        Hands Behind Back   Deferred                     Elbow            Flexion            Extension            Pronation            Supination            (* = pain; Blank rows = not tested)     PROM            PROM (Normal range in degrees) PROM 12/13/2021  PROM 01/30/22 PROM 01/30/22 PROM 03/13/22 PROM 04/05/22    Right Left Right Left Left Left  Shoulder          Flexion   50 WNL 145 159 145  Extension          Abduction      110 130 110  External Rotation   15 WNL 50 62 64  Internal Rotation   45 WNL WNL WNL 67  Hands Behind Head          Hands Behind Back                     Elbow          Flexion   WNL  WNL    Extension   -10  0    Pronation   WNL  WNL    Supination   WNL  WNL    (* = pain; Blank rows = not tested)     LE MMT: MMT (out of 5) Right 12/13/2021 Left 12/13/2021 Right 01/30/22 Left 01/30/22 Left 04/05/22            Shoulder      Flexion     5 4 4  $ Extension         Abduction     5 4 4  $ External rotation     5 4- 4  Internal rotation     5 4+ 4*  Horizontal abduction         Horizontal adduction  Lower Trapezius         Rhomboids                   Elbow     Flexion     5 4 4+  Extension     5 4 4  $ Pronation         Supination                   Wrist     Flexion         Extension         Radial deviation         Ulnar deviation                   (* = pain; Blank rows = not tested)    Palpation   Location LEFT  RIGHT           Subocciptials      Cervical paraspinals      Upper Trapezius 0    Levator Scapulae      Rhomboid Major/Minor      Sternoclavicular joint 0    Acromioclavicular joint 2    Coracoid process 2    Bicipital groove 2    Supraspinatus 1    Infraspinatus 0    Subscapularis      Teres Minor      Teres Major      Pectoralis Major 1    Pectoralis Minor      Anterior Deltoid 1    Lateral Deltoid 1    Posterior Deltoid 0    Latissimus Dorsi      Sternocleidomastoid      (Blank rows = not tested) Graded on 0-4 scale (0 = no pain, 1 = pain, 2 = pain with wincing/grimacing/flinching, 3 = pain with withdrawal, 4 = unwilling to allow palpation), (Blank rows = not tested)            TODAY'S TREATMENT    04/12/2022  Manual Therapy - for R shoulder ROM and to  prevent R shoulder stiffness   L shoulder PROM into flexion, abduction, ER and IR as tolerated by patient with gentle overpressure into end-ROM; x 15 minutes  Gentle STM to L biceps, anterior and middle deltoid; x 2 minutes  Gentle GHJ mobilizations at gr I-II, inferior and A-P for 2x30 sec each   *not today* L elbow PROM for flexion/extension and pronation/supination; x 1 minutes Passive upper trapezius stretching/scapular depression; 3x30 sec Glenohumeral mobilizations  at gr III for improved mobility, inferior and posterior; 1x30 sec each     Therapeutic Exercise - for shoulder complex ROM as needed for ability to perform reaching and self-care/ADLs   Upper body ergometer, 2 minutes forward, 2 minutes backward - for tissue warm-up to improve muscle performance, improved soft tissue mobility/extensibility - subjective information gathered during this billed time     Supine flexion AAROM with wand; 1x10, 5 sec hold   Standing, wand ER AAROM, with elbow propped to 90 deg on handrail (on staircase) in center of gym; x10 5 sec hold   Standing, wand abduction AAROM; 1x10 with 5 sec hold at top of range -moderate cueing on staying in true frontal plane of motion and maintaining neutral trunk position  Banded scapular rows with green theraband; 1x10   *next visit*   Standing Functional IR (hand behind back) AAROM with strap; x10, 5 sec hold   Supine flexion AROM; 20x Standing serratus  slide with foam roll; 1x10 with 5 sec hold at top of range    *not today* Supine flexion AROM; 2x10 Sidelying abduction AROM; 1x10 Finger ladder; x5, 10 sec for flexion today Supine ER AAROM with wand; 2x10, 5 sec hold   PATIENT EDUCATION: Discussed continuing with resistance drills with future PT visits with primary focus on ROM restoration at this time to prevent stiffness  Cold pack (unbilled) - for anti-inflammatory and analgesic effect as needed for reduced pain and improved ability to  participate in active PT intervention, along R shoulder with R elbow propped on pillow, pt seated;  x 5 minutes    *not today* Sidelying shoulder abduction AROM; 1x10 with surgical UE Shoulder isometrics; shoulder flexion, ABD, and extension; x15 each direction, 5 sec - heavy demonstration and verbal/tactile cueing for technique Wand flexion AAROM in supine; 2x10 Ball roll up wall, yellow physioball; x5 Swiss ball roll across table, shoulder abduction; x15, Green physioball  Pulley; flexion, 2 minutes AAROM Pendulums, forward/backward, side-to-side circles CW and CCW; x20 each with well arm propped on edge of treatment table Scapular retraction; reviewed Gripping; pink putty; 2 minutes Wrist AROM, flexion/extension and RD/UD; reviewed    PATIENT EDUCATION:  Education details: see above for patient education details Person educated: Patient Education method: Explanation, Handout Education comprehension: verbalized understanding     HOME EXERCISE PROGRAM: Access Code: [email or web address] URL: [email or web address] Date: 04/05/2022 Prepared by: Valentina Gu  Exercises - Supine Shoulder Flexion with Dowel  - 2 x daily - 7 x weekly - 2 sets - 10 reps - Supine Shoulder Flexion Extension Full Range AROM  - 2 x daily - 7 x weekly - 2 sets - 10 reps - Supine Shoulder External Rotation in 45 Degrees Abduction AAROM with Dowel  - 2 x daily - 7 x weekly - 2 sets - 10 reps - Standing Shoulder Internal Rotation Stretch with Towel  - 2 x daily - 7 x weekly - 2 sets - 10 reps - 5sec hold - Shoulder Flexion Wall Slide with Towel  - 2 x daily - 7 x weekly - 2 sets - 5-10 reps - Standing Shoulder Abduction Slides at Wall  - 2 x daily - 7 x weekly - 2 sets - 5-10 reps       ASSESSMENT:   CLINICAL IMPRESSION: Patient arrives today with minor decrease in stiffness and tolerated well with a slight increase with PROM; pt seems to be responding well with recent Prednisone taper (pt on day 3  of taper today). Patient has modest change in PROM with remaining deficits. Pt has remaining stiffness in L shoulder ROM and needs more work on multiple end-ranges of motion of the shoulder complex. Pt is having slight increase with glenohumeral flexion and ER. Pt has marked deficits in end-range flexion, shoulder abduction, functional IR (hand behind back), and GHJ ER. She does have pain with accessing end-range of motion, but fortunately her pain is generally low at baseline. Pt is able to progress into light resistance drills (having been cleared by surgeon following hernia repair) as AROM progresses toward functional ROM and near symmetry with opposite upper limb. Pt has remaining deficits in L shoulder strength (deltoid, RTC, periscapular mm), active and passive ROM, L shoulder stiffness, postural changes, and anterior shoulder/biceps pain intermittently with ROM work. Patient will benefit from continued skilled therapeutic intervention to address the above deficits as needed for improved function and QoL.     REHAB POTENTIAL: Excellent   CLINICAL DECISION MAKING:  Evolving/moderate complexity   EVALUATION COMPLEXITY: Low     GOALS: Goals reviewed with patient? Yes   SHORT TERM GOALS: Target date: 01/10/2022   Pt will be independent with HEP to improve strength and decrease neck pain to improve pain-free function at home and work. Baseline: 12/12/21: Baseline HEP initiated; 01/11/22: HEP recently updated 04/05/22: HEP updated today Goal status: Achieved;      LONG TERM GOALS: Target date: 02/07/2022   Pt will increase FOTO to at least 60 to demonstrate significant improvement in function at home and work related to neck pain  Baseline: 12/12/21: 29/60   01/11/22: 53/60    03/29/22: 56/60 Goal status: IN PROGRESS   2.   Pt will have L shoulder AROM at least within 10 degrees of contralateral upper extremity indicative of improved ROM as needed for reaching, overhead work, self-care  activities/dressing/grooming Baseline: 12/12/21: NO AROM presently, severely limited PROM in early post-op phase.   01/30/22: Shoulder IR WNL; pt is limited in all other planes of motion at this time    03/13/22: Pt has primary remaining AROM limitations in flexion and abduction.     04/05/22: Pt has remaining AROM limitations in flexion, abduction, IR (at 90 abduction), and ER (at 90 abduction),  Goal status: ON-GOING   3. Pt will improve L shoulder strength to at least 4+/5 or greater for all motions measured as needed for improved capacity to perform lifting and stabilize shoulder during working with power tools to complete home improvement projects and swing golf club       Baseline: 12/12/21: No MMTs due to early post-op status   01/30/22: MMT 4- to 4+      03/13/22: Deferred.  04/05/22: Gross strength 4/5 (see chart above) Goal status: ON-GOING   4. Pt will demonstrate golf swing with sound technique and no reproduction of shoulder pain as needed for return to golfing Baseline: 12/12/21: Unable to complete shoulder AROM or compound motion required for golf swing   01/30/22: Deferred.   03/13/22: Deferred. 04/05/22: deferred Goal status: DEFERRED     PLAN: PT FREQUENCY: 1-2x/week   PT DURATION:  4-6 weeks   PLANNED INTERVENTIONS: Therapeutic exercises, Therapeutic activity, Neuromuscular re-education, Patient/Family education, Joint mobilization, Dry Needling, Electrical stimulation, Cryotherapy, Moist heat, Traction, and Manual therapy   PLAN FOR NEXT SESSION: Continue with shoulder A/AAROM for gradually restoring full ROM. Pt may initiate light resistance exercise with load < 10 lbs. Scapular rows, supine serratus press-ups, standing abduction AAROM with wand;   Valentina Gu, PT, DPT [phone]  Vonna Drafts, SPT 04/12/2022, 6:26 PM

## 2022-04-14 ENCOUNTER — Encounter: Admit: 2022-04-14 | Payer: PRIVATE HEALTH INSURANCE | Attending: Internal Medicine | Primary: Internal Medicine

## 2022-04-14 ENCOUNTER — Encounter: Admit: 2022-04-14 | Payer: BLUE CROSS/BLUE SHIELD | Attending: Internal Medicine | Primary: Internal Medicine

## 2022-04-14 ENCOUNTER — Ambulatory Visit: Admit: 2022-04-14 | Payer: BLUE CROSS/BLUE SHIELD | Primary: Internal Medicine

## 2022-04-14 MED ORDER — DOXYCYCLINE HYCLATE 100 MG CAPSULE
100 | ORAL_CAPSULE | Freq: Two times a day (BID) | ORAL | 1 refills | 7.00000 days | Status: AC
Start: 2022-04-14 — End: 2022-04-14

## 2022-04-14 MED ORDER — DOXYCYCLINE HYCLATE 100 MG CAPSULE
100 | ORAL_CAPSULE | Freq: Two times a day (BID) | ORAL | 1 refills | 7.00000 days | Status: AC
Start: 2022-04-14 — End: ?

## 2022-04-14 MED ORDER — ALBUTEROL SULFATE HFA 90 MCG/ACTUATION AEROSOL INHALER
90 | Freq: Four times a day (QID) | RESPIRATORY_TRACT | 1 refills | 25.00000 days | Status: AC | PRN
Start: 2022-04-14 — End: 2022-05-11

## 2022-04-17 ENCOUNTER — Ambulatory Visit: Payer: Managed Care, Other (non HMO) | Admitting: Physical Therapy

## 2022-04-17 ENCOUNTER — Encounter: Payer: Self-pay | Admitting: Physical Therapy

## 2022-04-17 DIAGNOSIS — M25512 Pain in left shoulder: Secondary | ICD-10-CM

## 2022-04-17 DIAGNOSIS — M25612 Stiffness of left shoulder, not elsewhere classified: Secondary | ICD-10-CM

## 2022-04-17 DIAGNOSIS — M6281 Muscle weakness (generalized): Secondary | ICD-10-CM

## 2022-04-17 NOTE — Therapy (Unsigned)
OUTPATIENT PHYSICAL THERAPY TREATMENT  Patient Name: Ariana Jefferson MRN: [id number] DOB:Jun 14, 1960, 62 y.o., female Today's Date: 04/17/2022   PCP: Gayland Curry, MD REFERRING PROVIDER: Ann Lions, PA  END OF SESSION:   PT End of Session - 04/17/22 1820     Visit Number 22    Number of Visits 30    Date for PT Re-Evaluation 04/20/22    Authorization Type [employer] 2023, 60 combined PT/OT/Chiro    Authorization Time Period 12/12/21-02/10/22    Progress Note Due on Visit 10    PT Start Time [id number]    PT Stop Time 1802    PT Time Calculation (min) 47 min    Activity Tolerance Patient tolerated treatment well;Patient limited by pain    Behavior During Therapy WFL for tasks assessed/performed               Past Medical History:  Diagnosis Date   High cholesterol    Past Surgical History:  Procedure Laterality Date   [place] GASTRIC SLEEVE RESECTION     Patient Active Problem List   Diagnosis Date Noted   Hx of skin cancer, basal cell 02/07/2018   Obesity 02/07/2018   High cholesterol 12/11/2016   Low vitamin B12 level 12/11/2016   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   Lung nodule seen on imaging study 11/13/2014   DJD (degenerative joint disease) 05/14/2013    REFERRING DIAG:  [id number] (ICD-10-CM) - S/P arthroscopy of left shoulder  Z01.818 (ICD-10-CM) - Encounter for other preprocedural examination    THERAPY DIAG:  Acute pain of left shoulder  Stiffness of left shoulder, not elsewhere classified  Muscle weakness (generalized)  Rationale for Evaluation and Treatment Rehabilitation  PERTINENT HISTORY: Pt is a 62 year old female s/p L shoulder arthroscopy with biceps tenodesis, resection of adhesions, SAD, DCE, debridement. Patient reports no post-op complications. Pt reports post-op pain is better than she anticipated; pt reports mild pain presently. Patient reports no instructions yet  on exercises to do with shoulder. Pt has weaned from opioid analgesic pain medicine; pt alternates Tylenol and Ibuprofen as needed. Pt denies recent physical trauma. Pt had emergency hemicholectomy earlier this year with good post-op recovery. Pt has been compliant with sling use as directed by her surgeon.       Patient Goals: Patient wants to return to golf, being able to remodel her home/paint, able to reach above head     PRECAUTIONS: No active biceps x 6 weeks, no resisted elbow flexion or supination, A/AAROM at 4 weeks post-op   DOS: 11/24/21   SUBJECTIVE:  SUBJECTIVE STATEMENT: Patient reports that achiness is "slim to none" in the past week, and she only has symptoms after activity. Pt states that reaching for something off a pantry shelf has been going well and extended reaching is still difficult to accomplish.    PAIN:  Are you having pain? Pt reports mild soreness at arrival to PT   OBJECTIVE: (objective measures completed at initial evaluation unless otherwise dated)   Patient Surveys  Eval: FOTO: 29, predicted score of 60   Posture Mild forward head, protracted scapulae   AROM              AROM (Normal range in degrees) AROM 12/13/2021 AROM 01/30/22 AROM 03/13/22 AROM 04/05/22    Right Left Right Left Right Left Right Left  Shoulder            Flexion   Deferred WNL 127 WFL 128  119  Extension   Deferred WNL 72 WFL 64    Abduction   Deferred WNL 133 WFL 121  132  External Rotation   Deferred WNL 40 WFL 84  80 (arm at side) 82 (arm at 90 deg abduction)  Internal Rotation   Deferred WNL 68 WFL 70  WFL (arm at side) 38 (at 90 abduction)  Hands Behind Head   Deferred        Hands Behind Back   Deferred                     Elbow            Flexion            Extension             Pronation            Supination            (* = pain; Blank rows = not tested)     PROM            PROM (Normal range in degrees) PROM 12/13/2021 PROM 01/30/22 PROM 01/30/22 PROM 03/13/22 PROM 04/05/22    Right Left Right Left Left Left  Shoulder          Flexion   50 WNL 145 159 145  Extension          Abduction      110 130 110  External Rotation   15 WNL 50 62 64  Internal Rotation   45 WNL WNL WNL 67  Hands Behind Head          Hands Behind Back                     Elbow          Flexion   WNL  WNL    Extension   -10  0    Pronation   WNL  WNL    Supination   WNL  WNL    (* = pain; Blank rows = not tested)     LE MMT: MMT (out of 5) Right 12/13/2021 Left 12/13/2021 Right 01/30/22 Left 01/30/22 Left 04/05/22            Shoulder      Flexion     '5 4 4  '$ Extension         Abduction     '5 4 4  '$ External rotation     5 4- 4  Internal rotation     5 4+ 4*  Horizontal  abduction         Horizontal adduction         Lower Trapezius         Rhomboids                   Elbow     Flexion     5 4 4+  Extension     '5 4 4  '$ Pronation         Supination                   Wrist     Flexion         Extension         Radial deviation         Ulnar deviation                   (* = pain; Blank rows = not tested)    Palpation   Location LEFT  RIGHT           Subocciptials      Cervical paraspinals      Upper Trapezius 0    Levator Scapulae      Rhomboid Major/Minor      Sternoclavicular joint 0    Acromioclavicular joint 2    Coracoid process 2    Bicipital groove 2    Supraspinatus 1    Infraspinatus 0    Subscapularis      Teres Minor      Teres Major      Pectoralis Major 1    Pectoralis Minor      Anterior Deltoid 1    Lateral Deltoid 1    Posterior Deltoid 0    Latissimus Dorsi      Sternocleidomastoid      (Blank rows = not tested) Graded on 0-4 scale (0 = no pain, 1 = pain, 2 = pain with wincing/grimacing/flinching, 3 = pain with withdrawal,  4 = unwilling to allow palpation), (Blank rows = not tested)            TODAY'S TREATMENT    04/17/2022  Manual Therapy - for R shoulder ROM and to prevent R shoulder stiffness   L shoulder PROM into flexion, abduction, ER and IR as tolerated by patient with gentle overpressure into end-ROM; x 15 minutes  Gentle STM to L biceps, anterior and middle deltoid; x 2 minutes  Gentle GHJ mobilizations at gr I-II, inferior and A-P for 2x30 sec each   *not today* L elbow PROM for flexion/extension and pronation/supination; x 1 minutes Passive upper trapezius stretching/scapular depression; 3x30 sec Glenohumeral mobilizations  at gr III for improved mobility, inferior and posterior; 1x30 sec each     Therapeutic Exercise - for shoulder complex ROM as needed for ability to perform reaching and self-care/ADLs   Upper body ergometer, 2 minutes forward, 2 minutes backward - for tissue warm-up to improve muscle performance, improved soft tissue mobility/extensibility - subjective information gathered during this billed time     Supine flexion AAROM with wand; 1x10, 5 sec hold  Standing, wand ER AAROM, with elbow propped to 90 deg on handrail (of staircase) in center of gym; x12 5 sec hold   Standing Functional IR (hand behind back) AAROM with strap; x12, 5 sec hold    Standing serratus slide with foam roll; 1x10 with 5 sec hold at top of range   Banded scapular rows with green theraband; 1x12   *not today* Standing,  wand abduction AAROM; 1x10 with 5 sec hold at top of range -moderate cueing on staying in true frontal plane of motion and maintaining neutral trunk position Supine flexion AROM; 2x10 Sidelying abduction AROM; 1x10 Finger ladder; x5, 10 sec for flexion today Supine ER AAROM with wand; 2x10, 5 sec hold    PATIENT EDUCATION: Discussed continuing with resistance drills with future PT visits with primary focus on ROM restoration at this time to prevent stiffness   Cold  pack (unbilled) - for anti-inflammatory and analgesic effect as needed for reduced pain and improved ability to participate in active PT intervention, along R shoulder with R elbow propped on pillow, pt seated;  x 5 minutes    *not today* Sidelying shoulder abduction AROM; 1x10 with surgical UE Shoulder isometrics; shoulder flexion, ABD, and extension; x15 each direction, 5 sec - heavy demonstration and verbal/tactile cueing for technique Wand flexion AAROM in supine; 2x10 Ball roll up wall, yellow physioball; x5 Swiss ball roll across table, shoulder abduction; x15, Green physioball  Pulley; flexion, 2 minutes AAROM Pendulums, forward/backward, side-to-side circles CW and CCW; x20 each with well arm propped on edge of treatment table Scapular retraction; reviewed Gripping; pink putty; 2 minutes Wrist AROM, flexion/extension and RD/UD; reviewed    PATIENT EDUCATION:  Education details: see above for patient education details Person educated: Patient Education method: Explanation, Handout Education comprehension: verbalized understanding     HOME EXERCISE PROGRAM: Access Code: [email or web address] URL: [email or web address] Date: 04/05/2022 Prepared by: Valentina Gu  Exercises - Supine Shoulder Flexion with Dowel  - 2 x daily - 7 x weekly - 2 sets - 10 reps - Supine Shoulder Flexion Extension Full Range AROM  - 2 x daily - 7 x weekly - 2 sets - 10 reps - Supine Shoulder External Rotation in 45 Degrees Abduction AAROM with Dowel  - 2 x daily - 7 x weekly - 2 sets - 10 reps - Standing Shoulder Internal Rotation Stretch with Towel  - 2 x daily - 7 x weekly - 2 sets - 10 reps - 5sec hold - Shoulder Flexion Wall Slide with Towel  - 2 x daily - 7 x weekly - 2 sets - 5-10 reps - Standing Shoulder Abduction Slides at Wall  - 2 x daily - 7 x weekly - 2 sets - 5-10 reps       ASSESSMENT:   CLINICAL IMPRESSION: Patient arrives today with minor decrease in stiffness and tolerated  well with a slight increase with PROM; pt seems to be responding well with recent Prednisone taper (pt on day 7 of taper today). Patient has modest change in PROM with remaining deficits. Pt demonstrated increase tolerance to standing serratus foam roll and performed increase in repetitions with a decrease in symptoms. Pt also demonstrated increase tolerance to standing functional IR (hand behind the back) and got to about L2 AAROM. Pt has remaining stiffness in L shoulder ROM and needs more work on multiple end-ranges of motion of the shoulder complex. Pt demonstrates slight improvement with glenohumeral flexion, ER, and abduction. Pt has marked deficits in end-range flexion, shoulder abduction, functional IR (hand behind back), and GHJ ER. She does have pain with accessing end-range of motion, but fortunately her pain is generally low at baseline. Pt is able to progress into light resistance drills (having been cleared by surgeon following hernia repair) as AROM progresses toward functional ROM and near symmetry with opposite upper limb. Pt has remaining deficits in L shoulder strength (deltoid, RTC, periscapular mm),  active and passive ROM, L shoulder stiffness, postural changes, and anterior shoulder/biceps pain intermittently with ROM work. Patient will benefit from continued skilled therapeutic intervention to address the above deficits as needed for improved function and QoL.     REHAB POTENTIAL: Excellent   CLINICAL DECISION MAKING: Evolving/moderate complexity   EVALUATION COMPLEXITY: Low     GOALS: Goals reviewed with patient? Yes   SHORT TERM GOALS: Target date: 01/10/2022   Pt will be independent with HEP to improve strength and decrease neck pain to improve pain-free function at home and work. Baseline: 12/12/21: Baseline HEP initiated; 01/11/22: HEP recently updated 04/05/22: HEP updated today Goal status: Achieved;      LONG TERM GOALS: Target date: 02/07/2022   Pt will increase  FOTO to at least 60 to demonstrate significant improvement in function at home and work related to neck pain  Baseline: 12/12/21: 29/60   01/11/22: 53/60    03/29/22: 56/60 Goal status: IN PROGRESS   2.   Pt will have L shoulder AROM at least within 10 degrees of contralateral upper extremity indicative of improved ROM as needed for reaching, overhead work, self-care activities/dressing/grooming Baseline: 12/12/21: NO AROM presently, severely limited PROM in early post-op phase.   01/30/22: Shoulder IR WNL; pt is limited in all other planes of motion at this time    03/13/22: Pt has primary remaining AROM limitations in flexion and abduction.     04/05/22: Pt has remaining AROM limitations in flexion, abduction, IR (at 90 abduction), and ER (at 90 abduction),  Goal status: ON-GOING   3. Pt will improve L shoulder strength to at least 4+/5 or greater for all motions measured as needed for improved capacity to perform lifting and stabilize shoulder during working with power tools to complete home improvement projects and swing golf club       Baseline: 12/12/21: No MMTs due to early post-op status   01/30/22: MMT 4- to 4+      03/13/22: Deferred.  04/05/22: Gross strength 4/5 (see chart above) Goal status: ON-GOING   4. Pt will demonstrate golf swing with sound technique and no reproduction of shoulder pain as needed for return to golfing Baseline: 12/12/21: Unable to complete shoulder AROM or compound motion required for golf swing   01/30/22: Deferred.   03/13/22: Deferred. 04/05/22: deferred Goal status: DEFERRED     PLAN: PT FREQUENCY: 1-2x/week   PT DURATION:  4-6 weeks   PLANNED INTERVENTIONS: Therapeutic exercises, Therapeutic activity, Neuromuscular re-education, Patient/Family education, Joint mobilization, Dry Needling, Electrical stimulation, Cryotherapy, Moist heat, Traction, and Manual therapy   PLAN FOR NEXT SESSION: Continue with shoulder A/AAROM for gradually restoring full ROM. Pt may  initiate light resistance exercise with load < 10 lbs. Scapular rows, supine serratus press-ups, standing abduction AAROM with wand;   Valentina Gu, PT, DPT [phone]  Vonna Drafts, SPT 04/18/2022, 11:29 AM

## 2022-04-19 ENCOUNTER — Encounter: Admit: 2022-04-19 | Payer: PRIVATE HEALTH INSURANCE | Attending: Internal Medicine | Primary: Internal Medicine

## 2022-04-19 ENCOUNTER — Ambulatory Visit: Payer: Managed Care, Other (non HMO) | Admitting: Physical Therapy

## 2022-04-19 ENCOUNTER — Encounter: Payer: Self-pay | Admitting: Physical Therapy

## 2022-04-19 DIAGNOSIS — M25512 Pain in left shoulder: Secondary | ICD-10-CM

## 2022-04-19 DIAGNOSIS — M6281 Muscle weakness (generalized): Secondary | ICD-10-CM

## 2022-04-19 DIAGNOSIS — M25612 Stiffness of left shoulder, not elsewhere classified: Secondary | ICD-10-CM

## 2022-04-19 MED ORDER — DEXMETHYLPHENIDATE ER 20 MG CAPSULE,EXTENDED RELEASE BIPHASIC50-50
20 | ORAL_CAPSULE | ORAL | 1 refills | 30.00000 days | Status: AC
Start: 2022-04-19 — End: 2022-05-15

## 2022-04-19 MED ORDER — DEXMETHYLPHENIDATE 10 MG TABLET
10 | ORAL_TABLET | Freq: Two times a day (BID) | ORAL | 1 refills | 30.00000 days | Status: AC
Start: 2022-04-19 — End: 2022-05-15

## 2022-04-19 NOTE — Therapy (Signed)
OUTPATIENT PHYSICAL THERAPY TREATMENT  Patient Name: Ariana Jefferson MRN: [id number] DOB:Jan 19, 1961, 62 y.o., female Today's Date: 04/19/2022   PCP: Gayland Curry, MD REFERRING PROVIDER: Ann Lions, PA  END OF SESSION:   PT End of Session - 04/19/22 1824     Visit Number 23    Number of Visits 30    Date for PT Re-Evaluation 04/20/22    Authorization Type [employer] 2023, 60 combined PT/OT/Chiro    Authorization Time Period 12/12/21-02/10/22    Progress Note Due on Visit 10    PT Start Time [id number]    PT Stop Time 1800    PT Time Calculation (min) 45 min    Activity Tolerance Patient tolerated treatment well;Patient limited by pain    Behavior During Therapy [facility] for tasks assessed/performed                Past Medical History:  Diagnosis Date   High cholesterol    Past Surgical History:  Procedure Laterality Date   [place] GASTRIC SLEEVE RESECTION     Patient Active Problem List   Diagnosis Date Noted   Hx of skin cancer, basal cell 02/07/2018   Obesity 02/07/2018   High cholesterol 12/11/2016   Low vitamin B12 level 12/11/2016   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   Lung nodule seen on imaging study 11/13/2014   DJD (degenerative joint disease) 05/14/2013    REFERRING DIAG:  [id number] (ICD-10-CM) - S/P arthroscopy of left shoulder  Z01.818 (ICD-10-CM) - Encounter for other preprocedural examination    THERAPY DIAG:  Acute pain of left shoulder  Stiffness of left shoulder, not elsewhere classified  Muscle weakness (generalized)  Rationale for Evaluation and Treatment Rehabilitation  PERTINENT HISTORY: Pt is a 62 year old female s/p L shoulder arthroscopy with biceps tenodesis, resection of adhesions, SAD, DCE, debridement. Patient reports no post-op complications. Pt reports post-op pain is better than she anticipated; pt reports mild pain presently. Patient reports no instructions yet  on exercises to do with shoulder. Pt has weaned from opioid analgesic pain medicine; pt alternates Tylenol and Ibuprofen as needed. Pt denies recent physical trauma. Pt had emergency hemicholectomy earlier this year with good post-op recovery. Pt has been compliant with sling use as directed by her surgeon.       Patient Goals: Patient wants to return to golf, being able to remodel her home/paint, able to reach above head     PRECAUTIONS: No active biceps x 6 weeks, no resisted elbow flexion or supination, A/AAROM at 4 weeks post-op   DOS: 11/24/21   SUBJECTIVE:  SUBJECTIVE STATEMENT: Pt states that she is having the same achiness that she has been having. Pt reports minor stiffness since last PT visit but nothing more than usual. Pt reports that she is trying to push to increased range of motion with at home exercises.    PAIN:  Are you having pain? Pt reports mild soreness at arrival to PT   OBJECTIVE: (objective measures completed at initial evaluation unless otherwise dated)   Patient Surveys  Eval: FOTO: 29, predicted score of 60   Posture Mild forward head, protracted scapulae   AROM              AROM (Normal range in degrees) AROM 12/13/2021 AROM 01/30/22 AROM 03/13/22 AROM 04/05/22    Right Left Right Left Right Left Right Left  Shoulder            Flexion   Deferred WNL 127 WFL 128  119  Extension   Deferred WNL 72 WFL 64    Abduction   Deferred WNL 133 WFL 121  132  External Rotation   Deferred WNL 40 WFL 84  80 (arm at side) 82 (arm at 90 deg abduction)  Internal Rotation   Deferred WNL 68 WFL 70  WFL (arm at side) 38 (at 90 abduction)  Hands Behind Head   Deferred        Hands Behind Back   Deferred                     Elbow            Flexion            Extension             Pronation            Supination            (* = pain; Blank rows = not tested)     PROM            PROM (Normal range in degrees) PROM 12/13/2021 PROM 01/30/22 PROM 01/30/22 PROM 03/13/22 PROM 04/05/22    Right Left Right Left Left Left  Shoulder          Flexion   50 WNL 145 159 145  Extension          Abduction      110 130 110  External Rotation   15 WNL 50 62 64  Internal Rotation   45 WNL WNL WNL 67  Hands Behind Head          Hands Behind Back                     Elbow          Flexion   WNL  WNL    Extension   -10  0    Pronation   WNL  WNL    Supination   WNL  WNL    (* = pain; Blank rows = not tested)     LE MMT: MMT (out of 5) Right 12/13/2021 Left 12/13/2021 Right 01/30/22 Left 01/30/22 Left 04/05/22            Shoulder      Flexion     '5 4 4  '$ Extension         Abduction     '5 4 4  '$ External rotation     5 4- 4  Internal rotation     5 4+  4*  Horizontal abduction         Horizontal adduction         Lower Trapezius         Rhomboids                   Elbow     Flexion     5 4 4+  Extension     '5 4 4  '$ Pronation         Supination                   Wrist     Flexion         Extension         Radial deviation         Ulnar deviation                   (* = pain; Blank rows = not tested)    Palpation   Location LEFT  RIGHT           Subocciptials      Cervical paraspinals      Upper Trapezius 0    Levator Scapulae      Rhomboid Major/Minor      Sternoclavicular joint 0    Acromioclavicular joint 2    Coracoid process 2    Bicipital groove 2    Supraspinatus 1    Infraspinatus 0    Subscapularis      Teres Minor      Teres Major      Pectoralis Major 1    Pectoralis Minor      Anterior Deltoid 1    Lateral Deltoid 1    Posterior Deltoid 0    Latissimus Dorsi      Sternocleidomastoid      (Blank rows = not tested) Graded on 0-4 scale (0 = no pain, 1 = pain, 2 = pain with wincing/grimacing/flinching, 3 = pain with withdrawal,  4 = unwilling to allow palpation), (Blank rows = not tested)            TODAY'S TREATMENT    04/17/2022  Manual Therapy - for R shoulder ROM and to prevent R shoulder stiffness   L shoulder PROM into flexion, abduction, ER and IR as tolerated by patient with gentle overpressure into end-ROM; x 15 minutes    *not today* Gentle STM to L biceps, anterior and middle deltoid; x 2 minutes Gentle GHJ mobilizations at gr I-II, inferior and A-P for 2x30 sec each L elbow PROM for flexion/extension and pronation/supination; x 1 minutes Passive upper trapezius stretching/scapular depression; 3x30 sec Glenohumeral mobilizations  at gr III for improved mobility, inferior and posterior; 1x30 sec each     Therapeutic Exercise - for shoulder complex ROM as needed for ability to perform reaching and self-care/ADLs   Upper body ergometer, 2 minutes forward, 2 minutes backward - for tissue warm-up to improve muscle performance, improved soft tissue mobility/extensibility - subjective information gathered during this billed time    Standing, wand abduction AAROM; 2x10 with 5 sec hold at top of range -moderate cueing on staying in true frontal plane of motion and maintaining neutral trunk position   Standing Functional IR (hand behind back) AAROM with strap; x12, 5 sec hold    Standing serratus slide with foam roll; 1x10 with 5 sec hold at top of range   Banded scapular rows with green theraband; 1x12    *not today* Standing, wand ER  AAROM, with elbow propped to 90 deg on handrail (of staircase) in center of gym; x12 5 sec hold Supine flexion AAROM with wand; 1x10, 5 sec hold Supine flexion AROM; 2x10 Sidelying abduction AROM; 1x10 Finger ladder; x5, 10 sec for flexion today Supine ER AAROM with wand; 2x10, 5 sec hold    PATIENT EDUCATION: Discussed continuing with resistance drills with future PT visits with primary focus on ROM restoration at this time to prevent  stiffness       *not today* Sidelying shoulder abduction AROM; 1x10 with surgical UE Shoulder isometrics; shoulder flexion, ABD, and extension; x15 each direction, 5 sec - heavy demonstration and verbal/tactile cueing for technique Wand flexion AAROM in supine; 2x10 Ball roll up wall, yellow physioball; x5 Swiss ball roll across table, shoulder abduction; x15, Green physioball  Pulley; flexion, 2 minutes AAROM Pendulums, forward/backward, side-to-side circles CW and CCW; x20 each with well arm propped on edge of treatment table Scapular retraction; reviewed Gripping; pink putty; 2 minutes Wrist AROM, flexion/extension and RD/UD; reviewed Cold pack (unbilled) - for anti-inflammatory and analgesic effect as needed for reduced pain and improved ability to participate in active PT intervention, along R shoulder with R elbow propped on pillow, pt seated;  x 5 minutes   PATIENT EDUCATION:  Education details: see above for patient education details Person educated: Patient Education method: Explanation, Handout Education comprehension: verbalized understanding     HOME EXERCISE PROGRAM: Access Code: [email or web address] URL: [email or web address] Date: 04/05/2022 Prepared by: Valentina Gu  Exercises - Supine Shoulder Flexion with Dowel  - 2 x daily - 7 x weekly - 2 sets - 10 reps - Supine Shoulder Flexion Extension Full Range AROM  - 2 x daily - 7 x weekly - 2 sets - 10 reps - Supine Shoulder External Rotation in 45 Degrees Abduction AAROM with Dowel  - 2 x daily - 7 x weekly - 2 sets - 10 reps - Standing Shoulder Internal Rotation Stretch with Towel  - 2 x daily - 7 x weekly - 2 sets - 10 reps - 5sec hold - Shoulder Flexion Wall Slide with Towel  - 2 x daily - 7 x weekly - 2 sets - 5-10 reps - Standing Shoulder Abduction Slides at Wall  - 2 x daily - 7 x weekly - 2 sets - 5-10 reps       ASSESSMENT:   CLINICAL IMPRESSION: Patient arrives today with minor decrease in  stiffness and tolerated well with a slight increase with PROM. Patient has modest change in PROM with remaining deficits. Pt demonstrated increase tolerance to standing serratus foam roll and performed increase in repetitions with a decrease in symptoms. Pt also demonstrated increase tolerance to standing functional IR (hand behind the back) and got to about L2 AAROM. Pt has remaining stiffness in L shoulder ROM and needs more work on multiple end-ranges of motion of the shoulder complex. Pt demonstrates slight improvement with glenohumeral joint motion. Pt continues to have deficits in end-range GHJ flexion, abduction, functional IR (hand behind back), and ER. Pt does have increase in pain level but at a manageable level. Pt is able to progress into light resistance drills (having been cleared by surgeon following hernia repair) as AROM progresses toward functional ROM and near symmetry with opposite upper limb. Pt has remaining deficits in L shoulder strength (deltoid, RTC, periscapular mm), active and passive ROM, L shoulder stiffness, postural changes, and anterior shoulder/biceps pain intermittently with ROM work. Patient will benefit from continued skilled therapeutic  intervention to address the above deficits as needed for improved function and QoL.     REHAB POTENTIAL: Excellent   CLINICAL DECISION MAKING: Evolving/moderate complexity   EVALUATION COMPLEXITY: Low     GOALS: Goals reviewed with patient? Yes   SHORT TERM GOALS: Target date: 01/10/2022   Pt will be independent with HEP to improve strength and decrease neck pain to improve pain-free function at home and work. Baseline: 12/12/21: Baseline HEP initiated; 01/11/22: HEP recently updated 04/05/22: HEP updated today Goal status: Achieved;      LONG TERM GOALS: Target date: 02/07/2022   Pt will increase FOTO to at least 60 to demonstrate significant improvement in function at home and work related to neck pain  Baseline: 12/12/21:  29/60   01/11/22: 53/60    03/29/22: 56/60 Goal status: IN PROGRESS   2.   Pt will have L shoulder AROM at least within 10 degrees of contralateral upper extremity indicative of improved ROM as needed for reaching, overhead work, self-care activities/dressing/grooming Baseline: 12/12/21: NO AROM presently, severely limited PROM in early post-op phase.   01/30/22: Shoulder IR WNL; pt is limited in all other planes of motion at this time    03/13/22: Pt has primary remaining AROM limitations in flexion and abduction.     04/05/22: Pt has remaining AROM limitations in flexion, abduction, IR (at 90 abduction), and ER (at 90 abduction),  Goal status: ON-GOING   3. Pt will improve L shoulder strength to at least 4+/5 or greater for all motions measured as needed for improved capacity to perform lifting and stabilize shoulder during working with power tools to complete home improvement projects and swing golf club       Baseline: 12/12/21: No MMTs due to early post-op status   01/30/22: MMT 4- to 4+      03/13/22: Deferred.  04/05/22: Gross strength 4/5 (see chart above) Goal status: ON-GOING   4. Pt will demonstrate golf swing with sound technique and no reproduction of shoulder pain as needed for return to golfing Baseline: 12/12/21: Unable to complete shoulder AROM or compound motion required for golf swing   01/30/22: Deferred.   03/13/22: Deferred. 04/05/22: deferred Goal status: DEFERRED     PLAN: PT FREQUENCY: 1-2x/week   PT DURATION:  4-6 weeks   PLANNED INTERVENTIONS: Therapeutic exercises, Therapeutic activity, Neuromuscular re-education, Patient/Family education, Joint mobilization, Dry Needling, Electrical stimulation, Cryotherapy, Moist heat, Traction, and Manual therapy   PLAN FOR NEXT SESSION: Continue with shoulder A/AAROM for gradually restoring full ROM. Pt may initiate light resistance exercise with load < 10 lbs. Scapular rows, supine serratus press-ups, standing abduction AAROM with  wand;   Valentina Gu, PT, DPT [phone]  Vonna Drafts, SPT 04/20/2022, 2:35 PM

## 2022-04-26 ENCOUNTER — Encounter: Payer: Self-pay | Admitting: Physical Therapy

## 2022-04-26 ENCOUNTER — Ambulatory Visit: Payer: Managed Care, Other (non HMO) | Attending: Physician Assistant | Admitting: Physical Therapy

## 2022-04-26 DIAGNOSIS — M6281 Muscle weakness (generalized): Secondary | ICD-10-CM | POA: Diagnosis present

## 2022-04-26 DIAGNOSIS — M25612 Stiffness of left shoulder, not elsewhere classified: Secondary | ICD-10-CM | POA: Insufficient documentation

## 2022-04-26 DIAGNOSIS — M25512 Pain in left shoulder: Secondary | ICD-10-CM | POA: Insufficient documentation

## 2022-04-26 NOTE — Therapy (Signed)
OUTPATIENT PHYSICAL THERAPY TREATMENT  Patient Name: Ariana Jefferson MRN: [id number] DOB:[birth year], 62 y.o., female Today's Date: 04/26/2022   PCP: Gayland Curry, MD REFERRING PROVIDER: Ann Lions, PA  END OF SESSION:   PT End of Session - 04/26/22 1628     Visit Number 24    Number of Visits 30    Date for PT Re-Evaluation 04/20/22    Authorization Type [employer] 2023, 60 combined PT/OT/Chiro    Authorization Time Period 12/12/21-02/10/22    Progress Note Due on Visit 10    PT Start Time 1629    PT Stop Time 1713    PT Time Calculation (min) 44 min    Activity Tolerance Patient tolerated treatment well;Patient limited by pain    Behavior During Therapy [facility] for tasks assessed/performed             Past Medical History:  Diagnosis Date   High cholesterol    Past Surgical History:  Procedure Laterality Date   [place] GASTRIC SLEEVE RESECTION     Patient Active Problem List   Diagnosis Date Noted   Hx of skin cancer, basal cell 02/07/2018   Obesity 02/07/2018   High cholesterol 12/11/2016   Low vitamin B12 level 12/11/2016   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   Lung nodule seen on imaging study 11/13/2014   DJD (degenerative joint disease) 05/14/2013    REFERRING DIAG:  [id number] (ICD-10-CM) - S/P arthroscopy of left shoulder  Z01.818 (ICD-10-CM) - Encounter for other preprocedural examination    THERAPY DIAG:  Acute pain of left shoulder  Stiffness of left shoulder, not elsewhere classified  Muscle weakness (generalized)  Rationale for Evaluation and Treatment Rehabilitation  PERTINENT HISTORY: Pt is a 62 year old female s/p L shoulder arthroscopy with biceps tenodesis, resection of adhesions, SAD, DCE, debridement. Patient reports no post-op complications. Pt reports post-op pain is better than she anticipated; pt reports mild pain presently. Patient reports no instructions yet on  exercises to do with shoulder. Pt has weaned from opioid analgesic pain medicine; pt alternates Tylenol and Ibuprofen as needed. Pt denies recent physical trauma. Pt had emergency hemicholectomy earlier this year with good post-op recovery. Pt has been compliant with sling use as directed by her surgeon.       Patient Goals: Patient wants to return to golf, being able to remodel her home/paint, able to reach above head     PRECAUTIONS: No active biceps x 6 weeks, no resisted elbow flexion or supination, A/AAROM at 4 weeks post-op   DOS: 11/24/21   SUBJECTIVE:  SUBJECTIVE STATEMENT: Pt states she has some soreness in surgical shoulder at arrival to PT. She reports completing painting over the weekend with her L arm (pt is left-hand dominant).    PAIN:  Are you having pain? Pt reports mild soreness at arrival to PT   OBJECTIVE: (objective measures completed at initial evaluation unless otherwise dated)   Patient Surveys  Eval: FOTO: 29, predicted score of 60   Posture Mild forward head, protracted scapulae   AROM              AROM (Normal range in degrees) AROM 12/13/2021 AROM 01/30/22 AROM 03/13/22 AROM 04/05/22    Right Left Right Left Right Left Right Left  Shoulder            Flexion   Deferred WNL 127 WFL 128  119  Extension   Deferred WNL 72 WFL 64    Abduction   Deferred WNL 133 WFL 121  132  External Rotation   Deferred WNL 40 WFL 84  80 (arm at side) 82 (arm at 90 deg abduction)  Internal Rotation   Deferred WNL 68 WFL 70  WFL (arm at side) 38 (at 90 abduction)  Hands Behind Head   Deferred        Hands Behind Back   Deferred                     Elbow            Flexion            Extension            Pronation            Supination            (* = pain; Blank rows = not tested)      PROM            PROM (Normal range in degrees) PROM 12/13/2021 PROM 01/30/22 PROM 01/30/22 PROM 03/13/22 PROM 04/05/22    Right Left Right Left Left Left  Shoulder          Flexion   50 WNL 145 159 145  Extension          Abduction      110 130 110  External Rotation   15 WNL 50 62 64  Internal Rotation   45 WNL WNL WNL 67  Hands Behind Head          Hands Behind Back                     Elbow          Flexion   WNL  WNL    Extension   -10  0    Pronation   WNL  WNL    Supination   WNL  WNL    (* = pain; Blank rows = not tested)     LE MMT: MMT (out of 5) Right 12/13/2021 Left 12/13/2021 Right 01/30/22 Left 01/30/22 Left 04/05/22            Shoulder      Flexion     '5 4 4  '$ Extension         Abduction     '5 4 4  '$ External rotation     5 4- 4  Internal rotation     5 4+ 4*  Horizontal abduction         Horizontal adduction  Lower Trapezius         Rhomboids                   Elbow     Flexion     5 4 4+  Extension     5 4 4   Pronation         Supination                   Wrist     Flexion         Extension         Radial deviation         Ulnar deviation                   (* = pain; Blank rows = not tested)    Palpation   Location LEFT  RIGHT           Subocciptials      Cervical paraspinals      Upper Trapezius 0    Levator Scapulae      Rhomboid Major/Minor      Sternoclavicular joint 0    Acromioclavicular joint 2    Coracoid process 2    Bicipital groove 2    Supraspinatus 1    Infraspinatus 0    Subscapularis      Teres Minor      Teres Major      Pectoralis Major 1    Pectoralis Minor      Anterior Deltoid 1    Lateral Deltoid 1    Posterior Deltoid 0    Latissimus Dorsi      Sternocleidomastoid      (Blank rows = not tested) Graded on 0-4 scale (0 = no pain, 1 = pain, 2 = pain with wincing/grimacing/flinching, 3 = pain with withdrawal, 4 = unwilling to allow palpation), (Blank rows = not tested)            TODAY'S  TREATMENT    04/26/2022  Manual Therapy - for R shoulder ROM and to prevent R shoulder stiffness   L shoulder PROM into flexion, abduction, ER and IR as tolerated by patient with gentle overpressure into end-ROM; x 15 minutes    *not today* Gentle STM to L biceps, anterior and middle deltoid; x 2 minutes Gentle GHJ mobilizations at gr I-II, inferior and A-P for 2x30 sec each L elbow PROM for flexion/extension and pronation/supination; x 1 minutes Passive upper trapezius stretching/scapular depression; 3x30 sec Glenohumeral mobilizations  at gr III for improved mobility, inferior and posterior; 1x30 sec each     Therapeutic Exercise - for shoulder complex ROM as needed for ability to perform reaching and self-care/ADLs   Upper body ergometer, 2 minutes forward, 2 minutes backward - for tissue warm-up to improve muscle performance, improved soft tissue mobility/extensibility - subjective information gathered during this billed time    Wall slide, flexion and abduction; x10, 5 sec hold at top    Standing serratus slide with foam roll; 2x8 with 5 sec hold at top of range   Tband high rows with green theraband, linked to 4th hook from top; 1x12    Tband resisted ER; yellow Tband; 2x10   *not today* Standing Functional IR (hand behind back) AAROM with strap; x12, 5 sec hold   Standing, wand ER AAROM, with elbow propped to 90 deg on handrail (of staircase) in center of gym; x12 5 sec hold Supine flexion AAROM with wand; 1x10,  5 sec hold Supine flexion AROM; 2x10 Sidelying abduction AROM; 1x10 Finger ladder; x5, 10 sec for flexion today Supine ER AAROM with wand; 2x10, 5 sec hold    PATIENT EDUCATION: Discussed continuing with resistance drills with future PT visits with primary focus on ROM restoration at this time to prevent stiffness   Cold pack (unbilled) - for anti-inflammatory and analgesic effect as needed for reduced pain and improved ability to participate in active PT  intervention, along R shoulder with R elbow propped on pillow, pt seated;  x 5 minutes    *not today* Sidelying shoulder abduction AROM; 1x10 with surgical UE Shoulder isometrics; shoulder flexion, ABD, and extension; x15 each direction, 5 sec - heavy demonstration and verbal/tactile cueing for technique Wand flexion AAROM in supine; 2x10 Ball roll up wall, yellow physioball; x5 Swiss ball roll across table, shoulder abduction; x15, Green physioball  Pulley; flexion, 2 minutes AAROM Pendulums, forward/backward, side-to-side circles CW and CCW; x20 each with well arm propped on edge of treatment table Scapular retraction; reviewed Gripping; pink putty; 2 minutes Wrist AROM, flexion/extension and RD/UD; reviewed    PATIENT EDUCATION:  Education details: see above for patient education details Person educated: Patient Education method: Explanation, Handout Education comprehension: verbalized understanding     HOME EXERCISE PROGRAM: Access Code: [email or web address] URL: [email or web address] Date: 04/05/2022 Prepared by: Valentina Gu  Exercises - Supine Shoulder Flexion with Dowel  - 2 x daily - 7 x weekly - 2 sets - 10 reps - Supine Shoulder Flexion Extension Full Range AROM  - 2 x daily - 7 x weekly - 2 sets - 10 reps - Supine Shoulder External Rotation in 45 Degrees Abduction AAROM with Dowel  - 2 x daily - 7 x weekly - 2 sets - 10 reps - Standing Shoulder Internal Rotation Stretch with Towel  - 2 x daily - 7 x weekly - 2 sets - 10 reps - 5sec hold - Shoulder Flexion Wall Slide with Towel  - 2 x daily - 7 x weekly - 2 sets - 5-10 reps - Standing Shoulder Abduction Slides at Wall  - 2 x daily - 7 x weekly - 2 sets - 5-10 reps       ASSESSMENT:   CLINICAL IMPRESSION: Patient demonstrates markedly improving L shoulder ROM and is approaching symmetry with opposite limb with passive shoulder complex flexion, ER, and IR. Pt still has moderate deficit with shoulder complex  abduction, though this continues to improve. We have worked further into Neurosurgeon with pt having no significant pain. Pt still needs further work on restoring ROM and LUE strength. Pt has remaining deficits in L shoulder strength (deltoid, RTC, periscapular mm), active and passive ROM, L shoulder stiffness, postural changes, and anterior shoulder/biceps pain intermittently with ROM work. Patient will benefit from continued skilled therapeutic intervention to address the above deficits as needed for improved function and QoL.     REHAB POTENTIAL: Excellent   CLINICAL DECISION MAKING: Evolving/moderate complexity   EVALUATION COMPLEXITY: Low     GOALS: Goals reviewed with patient? Yes   SHORT TERM GOALS: Target date: 01/10/2022   Pt will be independent with HEP to improve strength and decrease neck pain to improve pain-free function at home and work. Baseline: 12/12/21: Baseline HEP initiated; 01/11/22: HEP recently updated 04/05/22: HEP updated today Goal status: Achieved;      LONG TERM GOALS: Target date: 02/07/2022   Pt will increase FOTO to at least 60 to demonstrate significant improvement in function  at home and work related to neck pain  Baseline: 12/12/21: 29/60   01/11/22: 53/60    03/29/22: 56/60 Goal status: IN PROGRESS   2.   Pt will have L shoulder AROM at least within 10 degrees of contralateral upper extremity indicative of improved ROM as needed for reaching, overhead work, self-care activities/dressing/grooming Baseline: 12/12/21: NO AROM presently, severely limited PROM in early post-op phase.   01/30/22: Shoulder IR WNL; pt is limited in all other planes of motion at this time    03/13/22: Pt has primary remaining AROM limitations in flexion and abduction.     04/05/22: Pt has remaining AROM limitations in flexion, abduction, IR (at 90 abduction), and ER (at 90 abduction),  Goal status: ON-GOING   3. Pt will improve L shoulder strength to at least 4+/5 or  greater for all motions measured as needed for improved capacity to perform lifting and stabilize shoulder during working with power tools to complete home improvement projects and swing golf club       Baseline: 12/12/21: No MMTs due to early post-op status   01/30/22: MMT 4- to 4+      03/13/22: Deferred.  04/05/22: Gross strength 4/5 (see chart above) Goal status: ON-GOING   4. Pt will demonstrate golf swing with sound technique and no reproduction of shoulder pain as needed for return to golfing Baseline: 12/12/21: Unable to complete shoulder AROM or compound motion required for golf swing   01/30/22: Deferred.   03/13/22: Deferred. 04/05/22: deferred Goal status: DEFERRED     PLAN: PT FREQUENCY: 1-2x/week   PT DURATION:  4-6 weeks   PLANNED INTERVENTIONS: Therapeutic exercises, Therapeutic activity, Neuromuscular re-education, Patient/Family education, Joint mobilization, Dry Needling, Electrical stimulation, Cryotherapy, Moist heat, Traction, and Manual therapy   PLAN FOR NEXT SESSION: Continue with shoulder A/AAROM for gradually restoring full ROM. Pt may initiate light resistance exercise with load < 10 lbs. Scapular rows, supine serratus press-ups, standing abduction AAROM with wand;   Valentina Gu, PT, DPT [phone]  04/26/2022, 4:28 PM

## 2022-04-28 ENCOUNTER — Ambulatory Visit: Admit: 2022-04-28 | Payer: BLUE CROSS/BLUE SHIELD | Primary: Internal Medicine

## 2022-05-01 ENCOUNTER — Encounter: Admit: 2022-05-01 | Payer: PRIVATE HEALTH INSURANCE | Attending: Internal Medicine | Primary: Internal Medicine

## 2022-05-01 MED ORDER — OMEPRAZOLE 40 MG CAPSULE,DELAYED RELEASE
40 | ORAL_CAPSULE | ORAL | 12 refills | 90.00000 days | Status: AC
Start: 2022-05-01 — End: ?

## 2022-05-02 ENCOUNTER — Telehealth: Admit: 2022-05-02 | Payer: PRIVATE HEALTH INSURANCE | Attending: Internal Medicine | Primary: Internal Medicine

## 2022-05-03 ENCOUNTER — Encounter: Admit: 2022-05-03 | Payer: PRIVATE HEALTH INSURANCE | Primary: Internal Medicine

## 2022-05-04 ENCOUNTER — Ambulatory Visit: Payer: Managed Care, Other (non HMO) | Admitting: Physical Therapy

## 2022-05-04 ENCOUNTER — Encounter: Payer: Self-pay | Admitting: Physical Therapy

## 2022-05-04 DIAGNOSIS — M25512 Pain in left shoulder: Secondary | ICD-10-CM | POA: Diagnosis not present

## 2022-05-04 DIAGNOSIS — M6281 Muscle weakness (generalized): Secondary | ICD-10-CM

## 2022-05-04 DIAGNOSIS — M25612 Stiffness of left shoulder, not elsewhere classified: Secondary | ICD-10-CM

## 2022-05-04 NOTE — Therapy (Signed)
OUTPATIENT PHYSICAL THERAPY TREATMENT  Patient Name: Ariana Jefferson MRN: [id number] DOB:[birth year], 62 y.o., female Today's Date: 05/04/2022   PCP: Gayland Curry, MD REFERRING PROVIDER: Ann Lions, PA  END OF SESSION:   PT End of Session - 05/04/22 1652     Visit Number 25    Number of Visits 30    Date for PT Re-Evaluation 06/08/22    Authorization Type [employer] 2023, 60 combined PT/OT/Chiro    Authorization Time Period 12/12/21-02/10/22    Progress Note Due on Visit 10    PT Start Time 1647    PT Stop Time 1728    PT Time Calculation (min) 41 min    Activity Tolerance Patient tolerated treatment well;Patient limited by pain    Behavior During Therapy [facility] for tasks assessed/performed              Past Medical History:  Diagnosis Date   High cholesterol    Past Surgical History:  Procedure Laterality Date   [place] GASTRIC SLEEVE RESECTION     Patient Active Problem List   Diagnosis Date Noted   Hx of skin cancer, basal cell 02/07/2018   Obesity 02/07/2018   High cholesterol 12/11/2016   Low vitamin B12 level 12/11/2016   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   Lung nodule seen on imaging study 11/13/2014   DJD (degenerative joint disease) 05/14/2013    REFERRING DIAG:  [id number] (ICD-10-CM) - S/P arthroscopy of left shoulder  Z01.818 (ICD-10-CM) - Encounter for other preprocedural examination    THERAPY DIAG:  Acute pain of left shoulder  Stiffness of left shoulder, not elsewhere classified  Muscle weakness (generalized)  Rationale for Evaluation and Treatment Rehabilitation  PERTINENT HISTORY: Pt is a 62 year old female s/p L shoulder arthroscopy with biceps tenodesis, resection of adhesions, SAD, DCE, debridement. Patient reports no post-op complications. Pt reports post-op pain is better than she anticipated; pt reports mild pain presently. Patient reports no instructions yet on  exercises to do with shoulder. Pt has weaned from opioid analgesic pain medicine; pt alternates Tylenol and Ibuprofen as needed. Pt denies recent physical trauma. Pt had emergency hemicholectomy earlier this year with good post-op recovery. Pt has been compliant with sling use as directed by her surgeon.       Patient Goals: Patient wants to return to golf, being able to remodel her home/paint, able to reach above head     PRECAUTIONS: No active biceps x 6 weeks, no resisted elbow flexion or supination, A/AAROM at 4 weeks post-op   DOS: 11/24/21   SUBJECTIVE:  SUBJECTIVE STATEMENT: Pt reports using her arm for household work with some resulting soreness. She reports some weakness remaining in her surgical upper limb. Pt is compliant with her HEP.    PAIN:  Are you having pain? Pt reports mild soreness at arrival to PT   OBJECTIVE: (objective measures completed at initial evaluation unless otherwise dated)   Patient Surveys  Eval: FOTO: 29, predicted score of 60   Posture Mild forward head, protracted scapulae   AROM              AROM (Normal range in degrees) AROM 12/13/2021 AROM 01/30/22 AROM 03/13/22 AROM 04/05/22    Right Left Right Left Right Left Right Left  Shoulder            Flexion   Deferred WNL 127 WFL 128  119  Extension   Deferred WNL 72 WFL 64    Abduction   Deferred WNL 133 WFL 121  132  External Rotation   Deferred WNL 40 WFL 84  80 (arm at side) 82 (arm at 90 deg abduction)  Internal Rotation   Deferred WNL 68 WFL 70  WFL (arm at side) 38 (at 90 abduction)  Hands Behind Head   Deferred        Hands Behind Back   Deferred                     Elbow            Flexion            Extension            Pronation            Supination            (* = pain; Blank rows = not  tested)     PROM            PROM (Normal range in degrees) PROM 12/13/2021 PROM 01/30/22 PROM 01/30/22 PROM 03/13/22 PROM 04/05/22    Right Left Right Left Left Left  Shoulder          Flexion   50 WNL 145 159 145  Extension          Abduction      110 130 110  External Rotation   15 WNL 50 62 64  Internal Rotation   45 WNL WNL WNL 67  Hands Behind Head          Hands Behind Back                     Elbow          Flexion   WNL  WNL    Extension   -10  0    Pronation   WNL  WNL    Supination   WNL  WNL    (* = pain; Blank rows = not tested)     LE MMT: MMT (out of 5) Right 12/13/2021 Left 12/13/2021 Right 01/30/22 Left 01/30/22 Left 04/05/22            Shoulder      Flexion     5 4 4   Extension         Abduction     5 4 4   External rotation     5 4- 4  Internal rotation     5 4+ 4*  Horizontal abduction         Horizontal adduction  Lower Trapezius         Rhomboids                   Elbow     Flexion     5 4 4+  Extension     5 4 4   Pronation         Supination                   Wrist     Flexion         Extension         Radial deviation         Ulnar deviation                   (* = pain; Blank rows = not tested)    Palpation   Location LEFT  RIGHT           Subocciptials      Cervical paraspinals      Upper Trapezius 0    Levator Scapulae      Rhomboid Major/Minor      Sternoclavicular joint 0    Acromioclavicular joint 2    Coracoid process 2    Bicipital groove 2    Supraspinatus 1    Infraspinatus 0    Subscapularis      Teres Minor      Teres Major      Pectoralis Major 1    Pectoralis Minor      Anterior Deltoid 1    Lateral Deltoid 1    Posterior Deltoid 0    Latissimus Dorsi      Sternocleidomastoid      (Blank rows = not tested) Graded on 0-4 scale (0 = no pain, 1 = pain, 2 = pain with wincing/grimacing/flinching, 3 = pain with withdrawal, 4 = unwilling to allow palpation), (Blank rows = not tested)             TODAY'S TREATMENT    05/04/2022  Manual Therapy - for R shoulder ROM and to prevent R shoulder stiffness   L shoulder PROM into flexion, abduction, ER and IR as tolerated by patient with gentle overpressure into end-ROM; x 15 minutes    *not today* Gentle STM to L biceps, anterior and middle deltoid; x 2 minutes Gentle GHJ mobilizations at gr I-II, inferior and A-P for 2x30 sec each L elbow PROM for flexion/extension and pronation/supination; x 1 minutes Passive upper trapezius stretching/scapular depression; 3x30 sec Glenohumeral mobilizations  at gr III for improved mobility, inferior and posterior; 1x30 sec each     Therapeutic Exercise - for shoulder complex ROM as needed for ability to perform reaching and self-care/ADLs   Upper body ergometer, 2 minutes forward, 2 minutes backward - for tissue warm-up to improve muscle performance, improved soft tissue mobility/extensibility - subjective information gathered during this billed time    Wall slide, flexion and abduction; x10, 5 sec hold at top    Standing serratus slide with foam roll; 2x8 with 5 sec hold at top of range     Tband resisted ER; yellow Tband; 2x10    Tband high rows with green theraband, linked to 3rd hook from top; 2x10   PATIENT EDUCATION: Discussed continued POC with increasing emphasis on strengthening and attaining full ROM   Cold pack (unbilled) - for anti-inflammatory and analgesic effect as needed for reduced pain and improved ability to participate in active PT intervention, along R shoulder with  R elbow propped on pillow, pt seated;  x 5 minutes    *not today* Standing Functional IR (hand behind back) AAROM with strap; x12, 5 sec hold   Standing, wand ER AAROM, with elbow propped to 90 deg on handrail (of staircase) in center of gym; x12 5 sec hold Supine flexion AAROM with wand; 1x10, 5 sec hold Supine flexion AROM; 2x10 Sidelying abduction AROM; 1x10 Finger ladder; x5, 10 sec for  flexion today Supine ER AAROM with wand; 2x10, 5 sec hold Sidelying shoulder abduction AROM; 1x10 with surgical UE Shoulder isometrics; shoulder flexion, ABD, and extension; x15 each direction, 5 sec - heavy demonstration and verbal/tactile cueing for technique Wand flexion AAROM in supine; 2x10 Ball roll up wall, yellow physioball; x5 Swiss ball roll across table, shoulder abduction; x15, Green physioball  Pulley; flexion, 2 minutes AAROM Pendulums, forward/backward, side-to-side circles CW and CCW; x20 each with well arm propped on edge of treatment table Scapular retraction; reviewed Gripping; pink putty; 2 minutes Wrist AROM, flexion/extension and RD/UD; reviewed    PATIENT EDUCATION:  Education details: see above for patient education details Person educated: Patient Education method: Explanation, Handout Education comprehension: verbalized understanding     HOME EXERCISE PROGRAM: Access Code: [email or web address] URL: [email or web address] Date: 04/05/2022 Prepared by: Valentina Gu  Exercises - Supine Shoulder Flexion with Dowel  - 2 x daily - 7 x weekly - 2 sets - 10 reps - Supine Shoulder Flexion Extension Full Range AROM  - 2 x daily - 7 x weekly - 2 sets - 10 reps - Supine Shoulder External Rotation in 45 Degrees Abduction AAROM with Dowel  - 2 x daily - 7 x weekly - 2 sets - 10 reps - Standing Shoulder Internal Rotation Stretch with Towel  - 2 x daily - 7 x weekly - 2 sets - 10 reps - 5sec hold - Shoulder Flexion Wall Slide with Towel  - 2 x daily - 7 x weekly - 2 sets - 5-10 reps - Standing Shoulder Abduction Slides at Wall  - 2 x daily - 7 x weekly - 2 sets - 5-10 reps       ASSESSMENT:   CLINICAL IMPRESSION: Patient is progressing well with L shoulder forward flexion and ER/IR. She has remaining moderate deficit with shoulder complex abduction and functional IR (hand behind back). Pt has tolerated additional resistance drills with light to moderate  resistance well. Pt still needs further work on restoring ROM and LUE strength. Pt has remaining deficits in L shoulder strength (deltoid, RTC, periscapular mm), active and passive ROM, L shoulder stiffness, postural changes, and anterior shoulder/biceps pain intermittently with ROM work. Patient will benefit from continued skilled therapeutic intervention to address the above deficits as needed for improved function and QoL.     REHAB POTENTIAL: Excellent   CLINICAL DECISION MAKING: Evolving/moderate complexity   EVALUATION COMPLEXITY: Low     GOALS: Goals reviewed with patient? Yes   SHORT TERM GOALS: Target date: 01/10/2022   Pt will be independent with HEP to improve strength and decrease neck pain to improve pain-free function at home and work. Baseline: 12/12/21: Baseline HEP initiated; 01/11/22: HEP recently updated 04/05/22: HEP updated today Goal status: Achieved;      LONG TERM GOALS: Target date: 02/07/2022   Pt will increase FOTO to at least 60 to demonstrate significant improvement in function at home and work related to neck pain  Baseline: 12/12/21: 29/60   01/11/22: 53/60    03/29/22: 56/60 Goal status: IN  PROGRESS   2.   Pt will have L shoulder AROM at least within 10 degrees of contralateral upper extremity indicative of improved ROM as needed for reaching, overhead work, self-care activities/dressing/grooming Baseline: 12/12/21: NO AROM presently, severely limited PROM in early post-op phase.   01/30/22: Shoulder IR WNL; pt is limited in all other planes of motion at this time    03/13/22: Pt has primary remaining AROM limitations in flexion and abduction.     04/05/22: Pt has remaining AROM limitations in flexion, abduction, IR (at 90 abduction), and ER (at 90 abduction),  Goal status: ON-GOING   3. Pt will improve L shoulder strength to at least 4+/5 or greater for all motions measured as needed for improved capacity to perform lifting and stabilize shoulder during  working with power tools to complete home improvement projects and swing golf club       Baseline: 12/12/21: No MMTs due to early post-op status   01/30/22: MMT 4- to 4+      03/13/22: Deferred.  04/05/22: Gross strength 4/5 (see chart above) Goal status: ON-GOING   4. Pt will demonstrate golf swing with sound technique and no reproduction of shoulder pain as needed for return to golfing Baseline: 12/12/21: Unable to complete shoulder AROM or compound motion required for golf swing   01/30/22: Deferred.   03/13/22: Deferred. 04/05/22: deferred Goal status: DEFERRED     PLAN: PT FREQUENCY: 1-2x/week   PT DURATION:  4-6 weeks   PLANNED INTERVENTIONS: Therapeutic exercises, Therapeutic activity, Neuromuscular re-education, Patient/Family education, Joint mobilization, Dry Needling, Electrical stimulation, Cryotherapy, Moist heat, Traction, and Manual therapy   PLAN FOR NEXT SESSION: Continue with shoulder A/AAROM for gradually restoring full ROM. Continue with progressive RTC/periscapular and deltoid strengthening as tolerated, slow progression of biceps loading.    Valentina Gu, PT, DPT [phone]  05/04/2022, 4:55 PM

## 2022-05-09 ENCOUNTER — Encounter: Payer: Self-pay | Admitting: Physical Therapy

## 2022-05-09 ENCOUNTER — Ambulatory Visit: Payer: Managed Care, Other (non HMO) | Admitting: Physical Therapy

## 2022-05-09 DIAGNOSIS — M25512 Pain in left shoulder: Secondary | ICD-10-CM | POA: Diagnosis not present

## 2022-05-09 DIAGNOSIS — M6281 Muscle weakness (generalized): Secondary | ICD-10-CM

## 2022-05-09 DIAGNOSIS — M25612 Stiffness of left shoulder, not elsewhere classified: Secondary | ICD-10-CM

## 2022-05-09 NOTE — Therapy (Signed)
OUTPATIENT PHYSICAL THERAPY TREATMENT  Patient Name: Ariana Jefferson MRN: [id number] DOB:27-Oct-1960, 62 y.o., female Today's Date: 05/09/2022   PCP: Gayland Curry, MD REFERRING PROVIDER: Ann Lions, PA  END OF SESSION:   PT End of Session - 05/09/22 1307     Visit Number 26    Number of Visits 30    Date for PT Re-Evaluation 06/08/22    Authorization Type [employer] 2023, 60 combined PT/OT/Chiro    Authorization Time Period 12/12/21-02/10/22    Progress Note Due on Visit 10    PT Start Time 1304    PT Stop Time 1350    PT Time Calculation (min) 46 min    Activity Tolerance Patient tolerated treatment well;Patient limited by pain    Behavior During Therapy WFL for tasks assessed/performed              Past Medical History:  Diagnosis Date   High cholesterol    Past Surgical History:  Procedure Laterality Date   [place] GASTRIC SLEEVE RESECTION     Patient Active Problem List   Diagnosis Date Noted   Hx of skin cancer, basal cell 02/07/2018   Obesity 02/07/2018   High cholesterol 12/11/2016   Low vitamin B12 level 12/11/2016   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   Lung nodule seen on imaging study 11/13/2014   DJD (degenerative joint disease) 05/14/2013    REFERRING DIAG:  [id number] (ICD-10-CM) - S/P arthroscopy of left shoulder  Z01.818 (ICD-10-CM) - Encounter for other preprocedural examination    THERAPY DIAG:  Acute pain of left shoulder  Stiffness of left shoulder, not elsewhere classified  Muscle weakness (generalized)  Rationale for Evaluation and Treatment Rehabilitation  PERTINENT HISTORY: Pt is a 62 year old female s/p L shoulder arthroscopy with biceps tenodesis, resection of adhesions, SAD, DCE, debridement. Patient reports no post-op complications. Pt reports post-op pain is better than she anticipated; pt reports mild pain presently. Patient reports no instructions yet on  exercises to do with shoulder. Pt has weaned from opioid analgesic pain medicine; pt alternates Tylenol and Ibuprofen as needed. Pt denies recent physical trauma. Pt had emergency hemicholectomy earlier this year with good post-op recovery. Pt has been compliant with sling use as directed by her surgeon.       Patient Goals: Patient wants to return to golf, being able to remodel her home/paint, able to reach above head     PRECAUTIONS: No active biceps x 6 weeks, no resisted elbow flexion or supination, A/AAROM at 4 weeks post-op   DOS: 11/24/21   SUBJECTIVE:  SUBJECTIVE STATEMENT: Pt reports she is feeling relatively well at arrival. She has newborn grandson and was able to hold him (around 8-lb newborn). Patient reports some soreness in her L arm after working in her yard. She reports compliance with her HEP and working on wall slides as well at home.    PAIN:  Are you having pain? Pt reports mild soreness at arrival to PT   OBJECTIVE: (objective measures completed at initial evaluation unless otherwise dated)   Patient Surveys  Eval: FOTO: 29, predicted score of 60   Posture Mild forward head, protracted scapulae   AROM              AROM (Normal range in degrees) AROM 12/13/2021 AROM 01/30/22 AROM 03/13/22 AROM 04/05/22    Right Left Right Left Right Left Right Left  Shoulder            Flexion   Deferred WNL 127 WFL 128  119  Extension   Deferred WNL 72 WFL 64    Abduction   Deferred WNL 133 WFL 121  132  External Rotation   Deferred WNL 40 WFL 84  80 (arm at side) 82 (arm at 90 deg abduction)  Internal Rotation   Deferred WNL 68 WFL 70  WFL (arm at side) 38 (at 90 abduction)  Hands Behind Head   Deferred        Hands Behind Back   Deferred                     Elbow            Flexion             Extension            Pronation            Supination            (* = pain; Blank rows = not tested)     PROM            PROM (Normal range in degrees) PROM 12/13/2021 PROM 01/30/22 PROM 01/30/22 PROM 03/13/22 PROM 04/05/22    Right Left Right Left Left Left  Shoulder          Flexion   50 WNL 145 159 145  Extension          Abduction      110 130 110  External Rotation   15 WNL 50 62 64  Internal Rotation   45 WNL WNL WNL 67  Hands Behind Head          Hands Behind Back                     Elbow          Flexion   WNL  WNL    Extension   -10  0    Pronation   WNL  WNL    Supination   WNL  WNL    (* = pain; Blank rows = not tested)     LE MMT: MMT (out of 5) Right 12/13/2021 Left 12/13/2021 Right 01/30/22 Left 01/30/22 Left 04/05/22            Shoulder      Flexion     5 4 4   Extension         Abduction     5 4 4   External rotation     5 4- 4  Internal rotation  5 4+ 4*  Horizontal abduction         Horizontal adduction         Lower Trapezius         Rhomboids                   Elbow     Flexion     5 4 4+  Extension     5 4 4   Pronation         Supination                   Wrist     Flexion         Extension         Radial deviation         Ulnar deviation                   (* = pain; Blank rows = not tested)    Palpation   Location LEFT  RIGHT           Subocciptials      Cervical paraspinals      Upper Trapezius 0    Levator Scapulae      Rhomboid Major/Minor      Sternoclavicular joint 0    Acromioclavicular joint 2    Coracoid process 2    Bicipital groove 2    Supraspinatus 1    Infraspinatus 0    Subscapularis      Teres Minor      Teres Major      Pectoralis Major 1    Pectoralis Minor      Anterior Deltoid 1    Lateral Deltoid 1    Posterior Deltoid 0    Latissimus Dorsi      Sternocleidomastoid      (Blank rows = not tested) Graded on 0-4 scale (0 = no pain, 1 = pain, 2 = pain with wincing/grimacing/flinching, 3  = pain with withdrawal, 4 = unwilling to allow palpation), (Blank rows = not tested)            TODAY'S TREATMENT    05/09/2022  Manual Therapy - for R shoulder ROM and to prevent R shoulder stiffness   L shoulder PROM into flexion, abduction, ER and IR as tolerated by patient with gentle overpressure into end-ROM; x 10 minutes    *not today* Gentle STM to L biceps, anterior and middle deltoid; x 2 minutes Gentle GHJ mobilizations at gr I-II, inferior and A-P for 2x30 sec each L elbow PROM for flexion/extension and pronation/supination; x 1 minutes Passive upper trapezius stretching/scapular depression; 3x30 sec Glenohumeral mobilizations  at gr III for improved mobility, inferior and posterior; 1x30 sec each     Therapeutic Exercise - for shoulder complex ROM as needed for ability to perform reaching and self-care/ADLs   Upper body ergometer, 2 minutes forward, 2 minutes backward - for tissue warm-up to improve muscle performance, improved soft tissue mobility/extensibility - subjective information gathered during this billed time    Supine flexion AAROM with wand; 1x10, 5 sec hold, with 5-lb cuff weight for overhead mobility     Sidelying shoulder abduction AROM; 2x10 with surgical UE  Standing serratus slide with foam roll; 2x8 with 5 sec hold at top of range     Wall slide, flexion and abduction; x10, 5 sec hold at top     Tband resisted ER; yellow Tband; 2x10    PATIENT EDUCATION: Discussed continued  POC with increasing emphasis on strengthening and attaining full ROM     *not today* Tband high rows with green theraband, linked to 3rd hook from top; 2x10 Standing Functional IR (hand behind back) AAROM with strap; x12, 5 sec hold   Standing, wand ER AAROM, with elbow propped to 90 deg on handrail (of staircase) in center of gym; x12 5 sec hold Supine flexion AROM; 2x10 Sidelying abduction AROM; 1x10 Finger ladder; x5, 10 sec for flexion today Supine ER AAROM  with wand; 2x10, 5 sec hold Shoulder isometrics; shoulder flexion, ABD, and extension; x15 each direction, 5 sec - heavy demonstration and verbal/tactile cueing for technique Wand flexion AAROM in supine; 2x10 Ball roll up wall, yellow physioball; x5 Swiss ball roll across table, shoulder abduction; x15, Green physioball  Pulley; flexion, 2 minutes AAROM Pendulums, forward/backward, side-to-side circles CW and CCW; x20 each with well arm propped on edge of treatment table Scapular retraction; reviewed Gripping; pink putty; 2 minutes Wrist AROM, flexion/extension and RD/UD; reviewed Cold pack (unbilled) - for anti-inflammatory and analgesic effect as needed for reduced pain and improved ability to participate in active PT intervention, along R shoulder with R elbow propped on pillow, pt seated;  x 5 minutes    PATIENT EDUCATION:  Education details: see above for patient education details Person educated: Patient Education method: Explanation, Handout Education comprehension: verbalized understanding     HOME EXERCISE PROGRAM: Access Code: [email or web address] URL: [email or web address] Date: 04/05/2022 Prepared by: Valentina Gu  Exercises - Supine Shoulder Flexion with Dowel  - 2 x daily - 7 x weekly - 2 sets - 10 reps - Supine Shoulder Flexion Extension Full Range AROM  - 2 x daily - 7 x weekly - 2 sets - 10 reps - Supine Shoulder External Rotation in 45 Degrees Abduction AAROM with Dowel  - 2 x daily - 7 x weekly - 2 sets - 10 reps - Standing Shoulder Internal Rotation Stretch with Towel  - 2 x daily - 7 x weekly - 2 sets - 10 reps - 5sec hold - Shoulder Flexion Wall Slide with Towel  - 2 x daily - 7 x weekly - 2 sets - 5-10 reps - Standing Shoulder Abduction Slides at Wall  - 2 x daily - 7 x weekly - 2 sets - 5-10 reps       ASSESSMENT:   CLINICAL IMPRESSION: Patient demonstrates good progress with shoulder ROM in all planes. She has significant deficit in functional  IR and moderate deficit remaining with shoulder complex abduction. She has remaining weakness, but she has been able to better tolerate overhead motion and holding moderate weight with bilat UE. Pt has remaining deficits in L shoulder strength (deltoid, RTC, periscapular mm), active and passive ROM, L shoulder stiffness, postural changes, and anterior shoulder/biceps pain intermittently with ROM work. Patient will benefit from continued skilled therapeutic intervention to address the above deficits as needed for improved function and QoL.     REHAB POTENTIAL: Excellent   CLINICAL DECISION MAKING: Evolving/moderate complexity   EVALUATION COMPLEXITY: Low     GOALS: Goals reviewed with patient? Yes   SHORT TERM GOALS: Target date: 01/10/2022   Pt will be independent with HEP to improve strength and decrease neck pain to improve pain-free function at home and work. Baseline: 12/12/21: Baseline HEP initiated; 01/11/22: HEP recently updated 04/05/22: HEP updated today Goal status: Achieved;      LONG TERM GOALS: Target date: 02/07/2022   Pt will increase FOTO to at least  60 to demonstrate significant improvement in function at home and work related to neck pain  Baseline: 12/12/21: 29/60   01/11/22: 53/60    03/29/22: 56/60 Goal status: IN PROGRESS   2.   Pt will have L shoulder AROM at least within 10 degrees of contralateral upper extremity indicative of improved ROM as needed for reaching, overhead work, self-care activities/dressing/grooming Baseline: 12/12/21: NO AROM presently, severely limited PROM in early post-op phase.   01/30/22: Shoulder IR WNL; pt is limited in all other planes of motion at this time    03/13/22: Pt has primary remaining AROM limitations in flexion and abduction.     04/05/22: Pt has remaining AROM limitations in flexion, abduction, IR (at 90 abduction), and ER (at 90 abduction),  Goal status: ON-GOING   3. Pt will improve L shoulder strength to at least 4+/5 or  greater for all motions measured as needed for improved capacity to perform lifting and stabilize shoulder during working with power tools to complete home improvement projects and swing golf club       Baseline: 12/12/21: No MMTs due to early post-op status   01/30/22: MMT 4- to 4+      03/13/22: Deferred.  04/05/22: Gross strength 4/5 (see chart above) Goal status: ON-GOING   4. Pt will demonstrate golf swing with sound technique and no reproduction of shoulder pain as needed for return to golfing Baseline: 12/12/21: Unable to complete shoulder AROM or compound motion required for golf swing   01/30/22: Deferred.   03/13/22: Deferred. 04/05/22: deferred Goal status: DEFERRED     PLAN: PT FREQUENCY: 1-2x/week   PT DURATION:  4-6 weeks   PLANNED INTERVENTIONS: Therapeutic exercises, Therapeutic activity, Neuromuscular re-education, Patient/Family education, Joint mobilization, Dry Needling, Electrical stimulation, Cryotherapy, Moist heat, Traction, and Manual therapy   PLAN FOR NEXT SESSION: Continue with shoulder A/AAROM for gradually restoring full ROM. Continue with progressive RTC/periscapular and deltoid strengthening as tolerated, slow progression of biceps loading.    Valentina Gu, PT, DPT [phone] 05/09/2022, 1:55 PM

## 2022-05-11 ENCOUNTER — Encounter: Admit: 2022-05-11 | Payer: PRIVATE HEALTH INSURANCE | Attending: Internal Medicine | Primary: Internal Medicine

## 2022-05-11 ENCOUNTER — Ambulatory Visit: Payer: Managed Care, Other (non HMO) | Admitting: Physical Therapy

## 2022-05-11 MED ORDER — ALBUTEROL SULFATE HFA 90 MCG/ACTUATION AEROSOL INHALER
90 | RESPIRATORY_TRACT | 12 refills | 25.00000 days | Status: AC
Start: 2022-05-11 — End: ?

## 2022-05-15 ENCOUNTER — Encounter: Admit: 2022-05-15 | Payer: PRIVATE HEALTH INSURANCE | Attending: Internal Medicine | Primary: Internal Medicine

## 2022-05-15 ENCOUNTER — Encounter: Payer: Self-pay | Admitting: Physical Therapy

## 2022-05-15 ENCOUNTER — Ambulatory Visit: Payer: Managed Care, Other (non HMO) | Admitting: Physical Therapy

## 2022-05-15 DIAGNOSIS — M25512 Pain in left shoulder: Secondary | ICD-10-CM

## 2022-05-15 DIAGNOSIS — M6281 Muscle weakness (generalized): Secondary | ICD-10-CM

## 2022-05-15 DIAGNOSIS — M25612 Stiffness of left shoulder, not elsewhere classified: Secondary | ICD-10-CM

## 2022-05-15 MED ORDER — DEXMETHYLPHENIDATE 10 MG TABLET
10 | ORAL_TABLET | Freq: Two times a day (BID) | ORAL | 1 refills | 30.00000 days | Status: AC
Start: 2022-05-15 — End: 2022-06-16

## 2022-05-15 MED ORDER — DEXMETHYLPHENIDATE ER 20 MG CAPSULE,EXTENDED RELEASE BIPHASIC50-50
20 | ORAL_CAPSULE | ORAL | 1 refills | 30.00000 days | Status: AC
Start: 2022-05-15 — End: 2022-06-16

## 2022-05-15 NOTE — Therapy (Unsigned)
OUTPATIENT PHYSICAL THERAPY TREATMENT  Patient Name: Ariana Jefferson MRN: [id number] DOB:1960/07/13, 62 y.o., female Today's Date: 05/15/2022   PCP: Gayland Curry, MD REFERRING PROVIDER: Ann Lions, PA  END OF SESSION:   PT End of Session - 05/15/22 1726     Visit Number 27    Number of Visits 30    Date for PT Re-Evaluation 06/08/22    Authorization Type [employer] 2023, 60 combined PT/OT/Chiro    Authorization Time Period 12/12/21-02/10/22    Progress Note Due on Visit 10    PT Start Time 1718    PT Stop Time 1800    PT Time Calculation (min) 42 min    Activity Tolerance Patient tolerated treatment well;Patient limited by pain    Behavior During Therapy WFL for tasks assessed/performed             Past Medical History:  Diagnosis Date   High cholesterol    Past Surgical History:  Procedure Laterality Date   [place] GASTRIC SLEEVE RESECTION     Patient Active Problem List   Diagnosis Date Noted   Hx of skin cancer, basal cell 02/07/2018   Obesity 02/07/2018   High cholesterol 12/11/2016   Low vitamin B12 level 12/11/2016   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   Lung nodule seen on imaging study 11/13/2014   DJD (degenerative joint disease) 05/14/2013    REFERRING DIAG:  [id number] (ICD-10-CM) - S/P arthroscopy of left shoulder  Z01.818 (ICD-10-CM) - Encounter for other preprocedural examination    THERAPY DIAG:  Acute pain of left shoulder  Stiffness of left shoulder, not elsewhere classified  Muscle weakness (generalized)  Rationale for Evaluation and Treatment Rehabilitation  PERTINENT HISTORY: Pt is a 62 year old female s/p L shoulder arthroscopy with biceps tenodesis, resection of adhesions, SAD, DCE, debridement. Patient reports no post-op complications. Pt reports post-op pain is better than she anticipated; pt reports mild pain presently. Patient reports no instructions yet on  exercises to do with shoulder. Pt has weaned from opioid analgesic pain medicine; pt alternates Tylenol and Ibuprofen as needed. Pt denies recent physical trauma. Pt had emergency hemicholectomy earlier this year with good post-op recovery. Pt has been compliant with sling use as directed by her surgeon.       Patient Goals: Patient wants to return to golf, being able to remodel her home/paint, able to reach above head     PRECAUTIONS: No active biceps x 6 weeks, no resisted elbow flexion or supination, A/AAROM at 4 weeks post-op   DOS: 11/24/21   SUBJECTIVE:  SUBJECTIVE STATEMENT: Pt reports she had notable soreness after last visit, rated at 1/10; she states that soreness is still present to this day. She thinks that resisted ER may have made her more sore. She reports compliance with her HEP.    PAIN:  Are you having pain? Pt reports mild soreness at arrival to PT   OBJECTIVE: (objective measures completed at initial evaluation unless otherwise dated)   Patient Surveys  Eval: FOTO: 29, predicted score of 60   Posture Mild forward head, protracted scapulae   AROM              AROM (Normal range in degrees) AROM 12/13/2021 AROM 01/30/22 AROM 03/13/22 AROM 04/05/22    Right Left Right Left Right Left Right Left  Shoulder            Flexion   Deferred WNL 127 WFL 128  119  Extension   Deferred WNL 72 WFL 64    Abduction   Deferred WNL 133 WFL 121  132  External Rotation   Deferred WNL 40 WFL 84  80 (arm at side) 82 (arm at 90 deg abduction)  Internal Rotation   Deferred WNL 68 WFL 70  WFL (arm at side) 38 (at 90 abduction)  Hands Behind Head   Deferred        Hands Behind Back   Deferred                     Elbow            Flexion            Extension            Pronation             Supination            (* = pain; Blank rows = not tested)     PROM            PROM (Normal range in degrees) PROM 12/13/2021 PROM 01/30/22 PROM 01/30/22 PROM 03/13/22 PROM 04/05/22    Right Left Right Left Left Left  Shoulder          Flexion   50 WNL 145 159 145  Extension          Abduction      110 130 110  External Rotation   15 WNL 50 62 64  Internal Rotation   45 WNL WNL WNL 67  Hands Behind Head          Hands Behind Back                     Elbow          Flexion   WNL  WNL    Extension   -10  0    Pronation   WNL  WNL    Supination   WNL  WNL    (* = pain; Blank rows = not tested)     LE MMT: MMT (out of 5) Right 12/13/2021 Left 12/13/2021 Right 01/30/22 Left 01/30/22 Left 04/05/22            Shoulder      Flexion     5 4 4   Extension         Abduction     5 4 4   External rotation     5 4- 4  Internal rotation     5 4+ 4*  Horizontal abduction  Horizontal adduction         Lower Trapezius         Rhomboids                   Elbow     Flexion     5 4 4+  Extension     5 4 4   Pronation         Supination                   Wrist     Flexion         Extension         Radial deviation         Ulnar deviation                   (* = pain; Blank rows = not tested)    Palpation   Location LEFT  RIGHT           Subocciptials      Cervical paraspinals      Upper Trapezius 0    Levator Scapulae      Rhomboid Major/Minor      Sternoclavicular joint 0    Acromioclavicular joint 2    Coracoid process 2    Bicipital groove 2    Supraspinatus 1    Infraspinatus 0    Subscapularis      Teres Minor      Teres Major      Pectoralis Major 1    Pectoralis Minor      Anterior Deltoid 1    Lateral Deltoid 1    Posterior Deltoid 0    Latissimus Dorsi      Sternocleidomastoid      (Blank rows = not tested) Graded on 0-4 scale (0 = no pain, 1 = pain, 2 = pain with wincing/grimacing/flinching, 3 = pain with withdrawal, 4 = unwilling to allow  palpation), (Blank rows = not tested)            TODAY'S TREATMENT    05/15/2022  Manual Therapy - for R shoulder ROM and to prevent R shoulder stiffness   L shoulder PROM into flexion, abduction, ER and IR as tolerated by patient with gentle overpressure into end-ROM; x 10 minutes Gentle GHJ mobilizations at gr I-II for pain control, inferior and A-P for 2x30 sec each   Gentle STM to L biceps, anterior and middle deltoid; x 2 minutes   *not today* L elbow PROM for flexion/extension and pronation/supination; x 1 minutes Passive upper trapezius stretching/scapular depression; 3x30 sec Glenohumeral mobilizations  at gr III for improved mobility, inferior and posterior; 1x30 sec each     Therapeutic Exercise - for shoulder complex ROM as needed for ability to perform reaching and self-care/ADLs   Upper body ergometer, 2 minutes forward, 2 minutes backward - for tissue warm-up to improve muscle performance, improved soft tissue mobility/extensibility - subjective information gathered during this billed time    Rhythmic stabilization with arm at 100 deg forward elevation; 2 x 1 minutes in supine  Standing serratus slide with foam roll; 2x10 with 5 sec hold at top of range     Tband resisted ER walkout; yellow Tband; 2x10  Tband high rows with Red Theraband, linked to 1st hook at top; 2x10  PATIENT EDUCATION: Discussed continued POC with increasing emphasis on strengthening and attaining full ROM     *not today* Sidelying shoulder abduction AROM; 2x10 with surgical UE Wall  slide, flexion and abduction; x10, 5 sec hold at top  Supine flexion AAROM with wand; 1x10, 5 sec hold, with 5-lb cuff weight for overhead mobility  Standing Functional IR (hand behind back) AAROM with strap; x12, 5 sec hold   Standing, wand ER AAROM, with elbow propped to 90 deg on handrail (of staircase) in center of gym; x12 5 sec hold Supine flexion AROM; 2x10 Sidelying abduction AROM; 1x10 Finger  ladder; x5, 10 sec for flexion today Supine ER AAROM with wand; 2x10, 5 sec hold Shoulder isometrics; shoulder flexion, ABD, and extension; x15 each direction, 5 sec - heavy demonstration and verbal/tactile cueing for technique Wand flexion AAROM in supine; 2x10 Ball roll up wall, yellow physioball; x5 Swiss ball roll across table, shoulder abduction; x15, Green physioball  Pulley; flexion, 2 minutes AAROM Pendulums, forward/backward, side-to-side circles CW and CCW; x20 each with well arm propped on edge of treatment table Scapular retraction; reviewed Gripping; pink putty; 2 minutes Wrist AROM, flexion/extension and RD/UD; reviewed Cold pack (unbilled) - for anti-inflammatory and analgesic effect as needed for reduced pain and improved ability to participate in active PT intervention, along R shoulder with R elbow propped on pillow, pt seated;  x 5 minutes    PATIENT EDUCATION:  Education details: see above for patient education details Person educated: Patient Education method: Explanation, Handout Education comprehension: verbalized understanding     HOME EXERCISE PROGRAM: Access Code: [email or web address] URL: [email or web address] Date: 04/05/2022 Prepared by: Valentina Gu  Exercises - Supine Shoulder Flexion with Dowel  - 2 x daily - 7 x weekly - 2 sets - 10 reps - Supine Shoulder Flexion Extension Full Range AROM  - 2 x daily - 7 x weekly - 2 sets - 10 reps - Supine Shoulder External Rotation in 45 Degrees Abduction AAROM with Dowel  - 2 x daily - 7 x weekly - 2 sets - 10 reps - Standing Shoulder Internal Rotation Stretch with Towel  - 2 x daily - 7 x weekly - 2 sets - 10 reps - 5sec hold - Shoulder Flexion Wall Slide with Towel  - 2 x daily - 7 x weekly - 2 sets - 5-10 reps - Standing Shoulder Abduction Slides at Wall  - 2 x daily - 7 x weekly - 2 sets - 5-10 reps       ASSESSMENT:   CLINICAL IMPRESSION: Patient did have persisting discomfort following addition  of posterior cuff isotonics last visit. She reports doing well with other ROM work at home recently. We modified RTC resistance training to isometric walkout versus ER isotonic. This was well tolerated today with only report of muscle fatigue and mild burn as expected. She demonstrates normalizing passive motion in all planes; she is still markedly limited with functional IR. Pt has remaining deficits in L shoulder strength (deltoid, RTC, periscapular mm), active and passive ROM, L shoulder stiffness, postural changes, and anterior shoulder/biceps pain intermittently with ROM work. Patient will benefit from continued skilled therapeutic intervention to address the above deficits as needed for improved function and QoL.     REHAB POTENTIAL: Excellent   CLINICAL DECISION MAKING: Evolving/moderate complexity   EVALUATION COMPLEXITY: Low     GOALS: Goals reviewed with patient? Yes   SHORT TERM GOALS: Target date: 01/10/2022   Pt will be independent with HEP to improve strength and decrease neck pain to improve pain-free function at home and work. Baseline: 12/12/21: Baseline HEP initiated; 01/11/22: HEP recently updated 04/05/22: HEP updated today Goal status: Achieved;  LONG TERM GOALS: Target date: 02/07/2022   Pt will increase FOTO to at least 60 to demonstrate significant improvement in function at home and work related to neck pain  Baseline: 12/12/21: 29/60   01/11/22: 53/60    03/29/22: 56/60 Goal status: IN PROGRESS   2.   Pt will have L shoulder AROM at least within 10 degrees of contralateral upper extremity indicative of improved ROM as needed for reaching, overhead work, self-care activities/dressing/grooming Baseline: 12/12/21: NO AROM presently, severely limited PROM in early post-op phase.   01/30/22: Shoulder IR WNL; pt is limited in all other planes of motion at this time    03/13/22: Pt has primary remaining AROM limitations in flexion and abduction.     04/05/22: Pt has  remaining AROM limitations in flexion, abduction, IR (at 90 abduction), and ER (at 90 abduction),  Goal status: ON-GOING   3. Pt will improve L shoulder strength to at least 4+/5 or greater for all motions measured as needed for improved capacity to perform lifting and stabilize shoulder during working with power tools to complete home improvement projects and swing golf club       Baseline: 12/12/21: No MMTs due to early post-op status   01/30/22: MMT 4- to 4+      03/13/22: Deferred.  04/05/22: Gross strength 4/5 (see chart above) Goal status: ON-GOING   4. Pt will demonstrate golf swing with sound technique and no reproduction of shoulder pain as needed for return to golfing Baseline: 12/12/21: Unable to complete shoulder AROM or compound motion required for golf swing   01/30/22: Deferred.   03/13/22: Deferred. 04/05/22: deferred Goal status: DEFERRED     PLAN: PT FREQUENCY: 1-2x/week   PT DURATION:  4-6 weeks   PLANNED INTERVENTIONS: Therapeutic exercises, Therapeutic activity, Neuromuscular re-education, Patient/Family education, Joint mobilization, Dry Needling, Electrical stimulation, Cryotherapy, Moist heat, Traction, and Manual therapy   PLAN FOR NEXT SESSION: Continue with shoulder A/AAROM for gradually restoring full ROM. Continue with progressive RTC/periscapular and deltoid strengthening as tolerated, slow progression of biceps loading.    Valentina Gu, PT, DPT [phone] 05/15/2022, 5:27 PM

## 2022-05-17 ENCOUNTER — Encounter: Payer: Self-pay | Admitting: Physical Therapy

## 2022-05-17 ENCOUNTER — Ambulatory Visit: Payer: Managed Care, Other (non HMO) | Admitting: Physical Therapy

## 2022-05-17 DIAGNOSIS — M25612 Stiffness of left shoulder, not elsewhere classified: Secondary | ICD-10-CM

## 2022-05-17 DIAGNOSIS — M25512 Pain in left shoulder: Secondary | ICD-10-CM | POA: Diagnosis not present

## 2022-05-17 DIAGNOSIS — M6281 Muscle weakness (generalized): Secondary | ICD-10-CM

## 2022-05-17 NOTE — Therapy (Deleted)
OUTPATIENT PHYSICAL THERAPY TREATMENT  Patient Name: Ariana Jefferson MRN: [id number] DOB:1960-02-26, 62 y.o., female Today's Date: 05/17/2022   PCP: Gayland Curry, MD REFERRING PROVIDER: Ann Lions, PA  END OF SESSION:       Past Medical History:  Diagnosis Date   High cholesterol    Past Surgical History:  Procedure Laterality Date   CESAREAN SECTION     CHOLECYSTECTOMY     KNEE SURGERY     LAPAROSCOPIC GASTRIC SLEEVE RESECTION     Patient Active Problem List   Diagnosis Date Noted   Hx of skin cancer, basal cell 02/07/2018   Obesity 02/07/2018   High cholesterol 12/11/2016   Low vitamin B12 level 12/11/2016   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   Lung nodule seen on imaging study 11/13/2014   DJD (degenerative joint disease) 05/14/2013    REFERRING DIAG:  [id number] (ICD-10-CM) - S/P arthroscopy of left shoulder  Z01.818 (ICD-10-CM) - Encounter for other preprocedural examination    THERAPY DIAG:  Acute pain of left shoulder  Stiffness of left shoulder, not elsewhere classified  Muscle weakness (generalized)  Rationale for Evaluation and Treatment Rehabilitation  PERTINENT HISTORY: Pt is a 62 year old female s/p L shoulder arthroscopy with biceps tenodesis, resection of adhesions, SAD, DCE, debridement. Patient reports no post-op complications. Pt reports post-op pain is better than she anticipated; pt reports mild pain presently. Patient reports no instructions yet on exercises to do with shoulder. Pt has weaned from opioid analgesic pain medicine; pt alternates Tylenol and Ibuprofen as needed. Pt denies recent physical trauma. Pt had emergency hemicholectomy earlier this year with good post-op recovery. Pt has been compliant with sling use as directed by her surgeon.       Patient Goals: Patient wants to return to golf, being able to remodel her home/paint, able to reach above head     PRECAUTIONS: No active biceps x 6 weeks, no resisted elbow  flexion or supination, A/AAROM at 4 weeks post-op   DOS: 11/24/21   SUBJECTIVE:                                                                                                                                                                                      SUBJECTIVE STATEMENT: Pt reports she had notable soreness after last visit, last than 1/10 that she is still noticing.    PAIN:  Are you having pain? Pt reports mild soreness at arrival to PT   OBJECTIVE: (objective measures completed at initial evaluation unless otherwise dated)   Patient Surveys  Eval: FOTO: 29, predicted score of 60   Posture Mild forward head, protracted scapulae   AROM  AROM (Normal range in degrees) AROM 12/13/2021 AROM 01/30/22 AROM 03/13/22 AROM 04/05/22    Right Left Right Left Right Left Right Left  Shoulder            Flexion   Deferred WNL 127 WFL 128  119  Extension   Deferred WNL 72 WFL 64    Abduction   Deferred WNL 133 WFL 121  132  External Rotation   Deferred WNL 40 WFL 84  80 (arm at side) 82 (arm at 90 deg abduction)  Internal Rotation   Deferred WNL 68 WFL 70  WFL (arm at side) 38 (at 90 abduction)  Hands Behind Head   Deferred        Hands Behind Back   Deferred                     Elbow            Flexion            Extension            Pronation            Supination            (* = pain; Blank rows = not tested)     PROM            PROM (Normal range in degrees) PROM 12/13/2021 PROM 01/30/22 PROM 01/30/22 PROM 03/13/22 PROM 04/05/22    Right Left Right Left Left Left  Shoulder          Flexion   50 WNL 145 159 145  Extension          Abduction      110 130 110  External Rotation   15 WNL 50 62 64  Internal Rotation   45 WNL WNL WNL 67  Hands Behind Head          Hands Behind Back                     Elbow          Flexion   WNL  WNL    Extension   -10  0    Pronation   WNL  WNL    Supination   WNL  WNL    (* = pain; Blank rows = not  tested)     LE MMT: MMT (out of 5) Right 12/13/2021 Left 12/13/2021 Right 01/30/22 Left 01/30/22 Left 04/05/22            Shoulder      Flexion     5 4 4   Extension         Abduction     5 4 4   External rotation     5 4- 4  Internal rotation     5 4+ 4*  Horizontal abduction         Horizontal adduction         Lower Trapezius         Rhomboids                   Elbow     Flexion     5 4 4+  Extension     5 4 4   Pronation         Supination                   Wrist     Flexion  Extension         Radial deviation         Ulnar deviation                   (* = pain; Blank rows = not tested)    Palpation   Location LEFT  RIGHT           Subocciptials      Cervical paraspinals      Upper Trapezius 0    Levator Scapulae      Rhomboid Major/Minor      Sternoclavicular joint 0    Acromioclavicular joint 2    Coracoid process 2    Bicipital groove 2    Supraspinatus 1    Infraspinatus 0    Subscapularis      Teres Minor      Teres Major      Pectoralis Major 1    Pectoralis Minor      Anterior Deltoid 1    Lateral Deltoid 1    Posterior Deltoid 0    Latissimus Dorsi      Sternocleidomastoid      (Blank rows = not tested) Graded on 0-4 scale (0 = no pain, 1 = pain, 2 = pain with wincing/grimacing/flinching, 3 = pain with withdrawal, 4 = unwilling to allow palpation), (Blank rows = not tested)            TODAY'S TREATMENT    05/17/2022   Manual Therapy - for R shoulder ROM and to prevent R shoulder stiffness   L shoulder PROM into flexion, abduction, ER and IR as tolerated by patient with gentle overpressure into end-ROM; x 10 minutes    *not today* Gentle STM to L biceps, anterior and middle deltoid; x 2 minutes Gentle GHJ mobilizations at gr I-II, inferior and A-P for 2x30 sec each L elbow PROM for flexion/extension and pronation/supination; x 1 minutes Passive upper trapezius stretching/scapular depression; 3x30 sec Glenohumeral  mobilizations  at gr III for improved mobility, inferior and posterior; 1x30 sec each     Therapeutic Exercise - for shoulder complex ROM as needed for ability to perform reaching and self-care/ADLs   Upper body ergometer, 2 minutes forward, 2 minutes backward - for tissue warm-up to improve muscle performance, improved soft tissue mobility/extensibility - subjective information gathered during this billed time    Rhythmic stabilization with arm at 100 deg forward elevation; 2 x 1 minutes in supine    Sidelying shoulder abduction AROM; 2x10 with surgical UE  Standing serratus slide with foam roll; 2x10 with 5 sec hold at top of range     Tband resisted ER walkout; yellow Tband; 2x10  Tband high rows with Red Theraband, linked to 1st hook at top; 2x10  PATIENT EDUCATION: Discussed continued POC with increasing emphasis on strengthening and attaining full ROM     *not today* Wall slide, flexion and abduction; x10, 5 sec hold at top  Supine flexion AAROM with wand; 1x10, 5 sec hold, with 5-lb cuff weight for overhead mobility  Standing Functional IR (hand behind back) AAROM with strap; x12, 5 sec hold   Standing, wand ER AAROM, with elbow propped to 90 deg on handrail (of staircase) in center of gym; x12 5 sec hold Supine flexion AROM; 2x10 Sidelying abduction AROM; 1x10 Finger ladder; x5, 10 sec for flexion today Supine ER AAROM with wand; 2x10, 5 sec hold Shoulder isometrics; shoulder flexion, ABD, and extension; x15 each direction, 5 sec - heavy  demonstration and verbal/tactile cueing for technique Wand flexion AAROM in supine; 2x10 Ball roll up wall, yellow physioball; x5 Swiss ball roll across table, shoulder abduction; x15, Green physioball  Pulley; flexion, 2 minutes AAROM Pendulums, forward/backward, side-to-side circles CW and CCW; x20 each with well arm propped on edge of treatment table Scapular retraction; reviewed Gripping; pink putty; 2 minutes Wrist AROM,  flexion/extension and RD/UD; reviewed Cold pack (unbilled) - for anti-inflammatory and analgesic effect as needed for reduced pain and improved ability to participate in active PT intervention, along R shoulder with R elbow propped on pillow, pt seated;  x 5 minutes    PATIENT EDUCATION:  Education details: see above for patient education details Person educated: Patient Education method: Explanation, Handout Education comprehension: verbalized understanding     HOME EXERCISE PROGRAM: Access Code: [email or web address] URL: [email or web address] Date: 04/05/2022 Prepared by: Valentina Gu  Exercises - Supine Shoulder Flexion with Dowel  - 2 x daily - 7 x weekly - 2 sets - 10 reps - Supine Shoulder Flexion Extension Full Range AROM  - 2 x daily - 7 x weekly - 2 sets - 10 reps - Supine Shoulder External Rotation in 45 Degrees Abduction AAROM with Dowel  - 2 x daily - 7 x weekly - 2 sets - 10 reps - Standing Shoulder Internal Rotation Stretch with Towel  - 2 x daily - 7 x weekly - 2 sets - 10 reps - 5sec hold - Shoulder Flexion Wall Slide with Towel  - 2 x daily - 7 x weekly - 2 sets - 5-10 reps - Standing Shoulder Abduction Slides at Wall  - 2 x daily - 7 x weekly - 2 sets - 5-10 reps       ASSESSMENT:   CLINICAL IMPRESSION: Patient demonstrates good progress with shoulder ROM in all planes. She has significant deficit in functional IR and moderate deficit remaining with shoulder complex abduction. She has remaining weakness, but she has been able to better tolerate overhead motion and holding moderate weight with bilat UE. Pt has remaining deficits in L shoulder strength (deltoid, RTC, periscapular mm), active and passive ROM, L shoulder stiffness, postural changes, and anterior shoulder/biceps pain intermittently with ROM work. Patient will benefit from continued skilled therapeutic intervention to address the above deficits as needed for improved function and QoL.     REHAB  POTENTIAL: Excellent   CLINICAL DECISION MAKING: Evolving/moderate complexity   EVALUATION COMPLEXITY: Low     GOALS: Goals reviewed with patient? Yes   SHORT TERM GOALS: Target date: 01/10/2022   Pt will be independent with HEP to improve strength and decrease neck pain to improve pain-free function at home and work. Baseline: 12/12/21: Baseline HEP initiated; 01/11/22: HEP recently updated 04/05/22: HEP updated today Goal status: Achieved;      LONG TERM GOALS: Target date: 02/07/2022   Pt will increase FOTO to at least 60 to demonstrate significant improvement in function at home and work related to neck pain  Baseline: 12/12/21: 29/60   01/11/22: 53/60    03/29/22: 56/60 Goal status: IN PROGRESS   2.   Pt will have L shoulder AROM at least within 10 degrees of contralateral upper extremity indicative of improved ROM as needed for reaching, overhead work, self-care activities/dressing/grooming Baseline: 12/12/21: NO AROM presently, severely limited PROM in early post-op phase.   01/30/22: Shoulder IR WNL; pt is limited in all other planes of motion at this time    03/13/22: Pt has primary remaining AROM limitations in  flexion and abduction.     04/05/22: Pt has remaining AROM limitations in flexion, abduction, IR (at 90 abduction), and ER (at 90 abduction),  Goal status: ON-GOING   3. Pt will improve L shoulder strength to at least 4+/5 or greater for all motions measured as needed for improved capacity to perform lifting and stabilize shoulder during working with power tools to complete home improvement projects and swing golf club       Baseline: 12/12/21: No MMTs due to early post-op status   01/30/22: MMT 4- to 4+      03/13/22: Deferred.  04/05/22: Gross strength 4/5 (see chart above) Goal status: ON-GOING   4. Pt will demonstrate golf swing with sound technique and no reproduction of shoulder pain as needed for return to golfing Baseline: 12/12/21: Unable to complete shoulder AROM  or compound motion required for golf swing   01/30/22: Deferred.   03/13/22: Deferred. 04/05/22: deferred Goal status: DEFERRED     PLAN: PT FREQUENCY: 1-2x/week   PT DURATION:  4-6 weeks   PLANNED INTERVENTIONS: Therapeutic exercises, Therapeutic activity, Neuromuscular re-education, Patient/Family education, Joint mobilization, Dry Needling, Electrical stimulation, Cryotherapy, Moist heat, Traction, and Manual therapy   PLAN FOR NEXT SESSION: Continue with shoulder A/AAROM for gradually restoring full ROM. Continue with progressive RTC/periscapular and deltoid strengthening as tolerated, slow progression of biceps loading.    Valentina Gu, PT, DPT [phone] 05/17/2022, 12:36 PM

## 2022-05-17 NOTE — Therapy (Signed)
OUTPATIENT PHYSICAL THERAPY TREATMENT  Patient Name: Ariana Jefferson MRN: [id number] DOB:May 27, 1960, 62 y.o., female Today's Date: 05/17/2022   PCP: Gayland Curry, MD REFERRING PROVIDER: Ann Lions, PA  END OF SESSION:   PT End of Session - 05/17/22 1714     Visit Number 28    Number of Visits 30    Date for PT Re-Evaluation 06/08/22    Authorization Type [employer] 2023, 60 combined PT/OT/Chiro    Authorization Time Period 12/12/21-02/10/22    Progress Note Due on Visit 10    PT Start Time 1717    PT Stop Time 1759    PT Time Calculation (min) 42 min    Activity Tolerance Patient tolerated treatment well;Patient limited by pain    Behavior During Therapy WFL for tasks assessed/performed              Past Medical History:  Diagnosis Date   High cholesterol    Past Surgical History:  Procedure Laterality Date   Novelty GASTRIC SLEEVE RESECTION     Patient Active Problem List   Diagnosis Date Noted   Hx of skin cancer, basal cell 02/07/2018   Obesity 02/07/2018   High cholesterol 12/11/2016   Low vitamin B12 level 12/11/2016   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   Lung nodule seen on imaging study 11/13/2014   DJD (degenerative joint disease) 05/14/2013    REFERRING DIAG:  [id number] (ICD-10-CM) - S/P arthroscopy of left shoulder  Z01.818 (ICD-10-CM) - Encounter for other preprocedural examination    THERAPY DIAG:  Acute pain of left shoulder  Stiffness of left shoulder, not elsewhere classified  Muscle weakness (generalized)  Rationale for Evaluation and Treatment Rehabilitation  PERTINENT HISTORY: Pt is a 62 year old female s/p L shoulder arthroscopy with biceps tenodesis, resection of adhesions, SAD, DCE, debridement. Patient reports no post-op complications. Pt reports post-op pain is better than she anticipated; pt reports mild pain presently. Patient reports no instructions yet on  exercises to do with shoulder. Pt has weaned from opioid analgesic pain medicine; pt alternates Tylenol and Ibuprofen as needed. Pt denies recent physical trauma. Pt had emergency hemicholectomy earlier this year with good post-op recovery. Pt has been compliant with sling use as directed by her surgeon.       Patient Goals: Patient wants to return to golf, being able to remodel her home/paint, able to reach above head     PRECAUTIONS: No active biceps x 6 weeks, no resisted elbow flexion or supination, A/AAROM at 4 weeks post-op   DOS: 11/24/21   SUBJECTIVE:  SUBJECTIVE STATEMENT: Pt reports minimal pain at arrival, some L shoulder soreness. She feels that isometric ER exercise was more tolerable last visit versus standing ER isotonic.   PAIN:  Are you having pain? Pt reports mild soreness at arrival to PT   OBJECTIVE: (objective measures completed at initial evaluation unless otherwise dated)   Patient Surveys  Eval: FOTO: 29, predicted score of 60   Posture Mild forward head, protracted scapulae   AROM              AROM (Normal range in degrees) AROM 12/13/2021 AROM 01/30/22 AROM 03/13/22 AROM 04/05/22    Right Left Right Left Right Left Right Left  Shoulder            Flexion   Deferred WNL 127 WFL 128  119  Extension   Deferred WNL 72 WFL 64    Abduction   Deferred WNL 133 WFL 121  132  External Rotation   Deferred WNL 40 WFL 84  80 (arm at side) 82 (arm at 90 deg abduction)  Internal Rotation   Deferred WNL 68 WFL 70  WFL (arm at side) 38 (at 90 abduction)  Hands Behind Head   Deferred        Hands Behind Back   Deferred                     Elbow            Flexion            Extension            Pronation            Supination            (* = pain; Blank rows = not tested)      PROM            PROM (Normal range in degrees) PROM 12/13/2021 PROM 01/30/22 PROM 01/30/22 PROM 03/13/22 PROM 04/05/22    Right Left Right Left Left Left  Shoulder          Flexion   50 WNL 145 159 145  Extension          Abduction      110 130 110  External Rotation   15 WNL 50 62 64  Internal Rotation   45 WNL WNL WNL 67  Hands Behind Head          Hands Behind Back                     Elbow          Flexion   WNL  WNL    Extension   -10  0    Pronation   WNL  WNL    Supination   WNL  WNL    (* = pain; Blank rows = not tested)     LE MMT: MMT (out of 5) Right 12/13/2021 Left 12/13/2021 Right 01/30/22 Left 01/30/22 Left 04/05/22            Shoulder      Flexion     5 4 4   Extension         Abduction     5 4 4   External rotation     5 4- 4  Internal rotation     5 4+ 4*  Horizontal abduction         Horizontal adduction  Lower Trapezius         Rhomboids                   Elbow     Flexion     5 4 4+  Extension     5 4 4   Pronation         Supination                   Wrist     Flexion         Extension         Radial deviation         Ulnar deviation                   (* = pain; Blank rows = not tested)    Palpation   Location LEFT  RIGHT           Subocciptials      Cervical paraspinals      Upper Trapezius 0    Levator Scapulae      Rhomboid Major/Minor      Sternoclavicular joint 0    Acromioclavicular joint 2    Coracoid process 2    Bicipital groove 2    Supraspinatus 1    Infraspinatus 0    Subscapularis      Teres Minor      Teres Major      Pectoralis Major 1    Pectoralis Minor      Anterior Deltoid 1    Lateral Deltoid 1    Posterior Deltoid 0    Latissimus Dorsi      Sternocleidomastoid      (Blank rows = not tested) Graded on 0-4 scale (0 = no pain, 1 = pain, 2 = pain with wincing/grimacing/flinching, 3 = pain with withdrawal, 4 = unwilling to allow palpation), (Blank rows = not tested)            TODAY'S  TREATMENT    05/17/2022  Manual Therapy - for R shoulder ROM and to prevent R shoulder stiffness   L shoulder PROM into flexion, abduction, ER and IR as tolerated by patient with gentle overpressure into end-ROM; x 10 minutes Gentle GHJ mobilizations at gr I-II for pain control, inferior and A-P for 2x30 sec each   *not today* Gentle STM to L biceps, anterior and middle deltoid; x 2 minutes L elbow PROM for flexion/extension and pronation/supination; x 1 minutes Passive upper trapezius stretching/scapular depression; 3x30 sec Glenohumeral mobilizations  at gr III for improved mobility, inferior and posterior; 1x30 sec each     Therapeutic Exercise - for shoulder complex ROM as needed for ability to perform reaching and self-care/ADLs, strengthening to improve capacity for loading biceps and RTC and as needed for improved ability to perform lifting   Upper body ergometer, 2 minutes forward, 2 minutes backward - for tissue warm-up to improve muscle performance, improved soft tissue mobility/extensibility - subjective information gathered during this billed time    Rhythmic stabilization with arm at 100 deg forward elevation; 2 x 1 minutes in supine  Standing serratus slide with foam roll, with yellow Tband at wrists; 2x8    Tband resisted ER walkout; yellow Tband; 2x10  Tband high rows with Red Theraband, linked to 1st hook at top; 2x10  Tband resisted scaption, in standing; Red Tband; 2x8    Wand functional IR (hand behind back); x20  PATIENT EDUCATION: Discussed continued POC with increasing emphasis  on strengthening and attaining full ROM     *not today* Sidelying shoulder abduction AROM; 2x10 with surgical UE Wall slide, flexion and abduction; x10, 5 sec hold at top  Supine flexion AAROM with wand; 1x10, 5 sec hold, with 5-lb cuff weight for overhead mobility  Standing Functional IR (hand behind back) AAROM with strap; x12, 5 sec hold   Standing, wand ER AAROM, with elbow  propped to 90 deg on handrail (of staircase) in center of gym; x12 5 sec hold Supine flexion AROM; 2x10 Sidelying abduction AROM; 1x10 Finger ladder; x5, 10 sec for flexion today Supine ER AAROM with wand; 2x10, 5 sec hold Shoulder isometrics; shoulder flexion, ABD, and extension; x15 each direction, 5 sec - heavy demonstration and verbal/tactile cueing for technique Wand flexion AAROM in supine; 2x10 Ball roll up wall, yellow physioball; x5 Swiss ball roll across table, shoulder abduction; x15, Green physioball  Pulley; flexion, 2 minutes AAROM Pendulums, forward/backward, side-to-side circles CW and CCW; x20 each with well arm propped on edge of treatment table Scapular retraction; reviewed Gripping; pink putty; 2 minutes Wrist AROM, flexion/extension and RD/UD; reviewed Cold pack (unbilled) - for anti-inflammatory and analgesic effect as needed for reduced pain and improved ability to participate in active PT intervention, along R shoulder with R elbow propped on pillow, pt seated;  x 5 minutes    PATIENT EDUCATION:  Education details: see above for patient education details Person educated: Patient Education method: Explanation, Handout Education comprehension: verbalized understanding     HOME EXERCISE PROGRAM: Access Code: [email or web address] URL: [email or web address] Date: 04/05/2022 Prepared by: Valentina Gu  Exercises - Supine Shoulder Flexion with Dowel  - 2 x daily - 7 x weekly - 2 sets - 10 reps - Supine Shoulder Flexion Extension Full Range AROM  - 2 x daily - 7 x weekly - 2 sets - 10 reps - Supine Shoulder External Rotation in 45 Degrees Abduction AAROM with Dowel  - 2 x daily - 7 x weekly - 2 sets - 10 reps - Standing Shoulder Internal Rotation Stretch with Towel  - 2 x daily - 7 x weekly - 2 sets - 10 reps - 5sec hold - Shoulder Flexion Wall Slide with Towel  - 2 x daily - 7 x weekly - 2 sets - 5-10 reps - Standing Shoulder Abduction Slides at Wall  - 2 x  daily - 7 x weekly - 2 sets - 5-10 reps       ASSESSMENT:   CLINICAL IMPRESSION: Patient is progressing well with ROM at this time in spite of significant stiffness and difficulty tolerating PROM and AAROM toward end of 2023 when pt was out of PT following hernia repair. Pt is able to progress further with strengthening at this time given pt well out of 6-week window following hernia repair for resistance exercise. She does still have marked weakness of L deltoid and posterior cuff, but pt is attaining functional ROM in most planes with primary limitation remaining in hand behind back/functional IR. Pt has remaining deficits in L shoulder strength (deltoid, RTC, periscapular mm), active and passive ROM, L shoulder stiffness, postural changes, and anterior shoulder/biceps pain intermittently with ROM work. Patient will benefit from continued skilled therapeutic intervention to address the above deficits as needed for improved function and QoL.     REHAB POTENTIAL: Excellent   CLINICAL DECISION MAKING: Evolving/moderate complexity   EVALUATION COMPLEXITY: Low     GOALS: Goals reviewed with patient? Yes   SHORT TERM GOALS: Target  date: 01/10/2022   Pt will be independent with HEP to improve strength and decrease neck pain to improve pain-free function at home and work. Baseline: 12/12/21: Baseline HEP initiated; 01/11/22: HEP recently updated 04/05/22: HEP updated today Goal status: Achieved;      LONG TERM GOALS: Target date: 02/07/2022   Pt will increase FOTO to at least 60 to demonstrate significant improvement in function at home and work related to neck pain  Baseline: 12/12/21: 29/60   01/11/22: 53/60    03/29/22: 56/60 Goal status: IN PROGRESS   2.   Pt will have L shoulder AROM at least within 10 degrees of contralateral upper extremity indicative of improved ROM as needed for reaching, overhead work, self-care activities/dressing/grooming Baseline: 12/12/21: NO AROM presently,  severely limited PROM in early post-op phase.   01/30/22: Shoulder IR WNL; pt is limited in all other planes of motion at this time    03/13/22: Pt has primary remaining AROM limitations in flexion and abduction.     04/05/22: Pt has remaining AROM limitations in flexion, abduction, IR (at 90 abduction), and ER (at 90 abduction),  Goal status: ON-GOING   3. Pt will improve L shoulder strength to at least 4+/5 or greater for all motions measured as needed for improved capacity to perform lifting and stabilize shoulder during working with power tools to complete home improvement projects and swing golf club       Baseline: 12/12/21: No MMTs due to early post-op status   01/30/22: MMT 4- to 4+      03/13/22: Deferred.  04/05/22: Gross strength 4/5 (see chart above) Goal status: ON-GOING   4. Pt will demonstrate golf swing with sound technique and no reproduction of shoulder pain as needed for return to golfing Baseline: 12/12/21: Unable to complete shoulder AROM or compound motion required for golf swing   01/30/22: Deferred.   03/13/22: Deferred. 04/05/22: deferred Goal status: DEFERRED     PLAN: PT FREQUENCY: 1-2x/week   PT DURATION:  4-6 weeks   PLANNED INTERVENTIONS: Therapeutic exercises, Therapeutic activity, Neuromuscular re-education, Patient/Family education, Joint mobilization, Dry Needling, Electrical stimulation, Cryotherapy, Moist heat, Traction, and Manual therapy   PLAN FOR NEXT SESSION: Continue with shoulder A/AAROM for gradually restoring full ROM. Continue with progressive RTC/periscapular and deltoid strengthening as tolerated, slow progression of biceps loading.    Valentina Gu, PT, DPT [phone] 05/17/2022, 5:14 PM

## 2022-05-19 ENCOUNTER — Encounter: Admit: 2022-05-19 | Payer: PRIVATE HEALTH INSURANCE | Primary: Internal Medicine

## 2022-05-22 ENCOUNTER — Ambulatory Visit: Payer: Managed Care, Other (non HMO) | Attending: Physician Assistant | Admitting: Physical Therapy

## 2022-05-22 DIAGNOSIS — M6281 Muscle weakness (generalized): Secondary | ICD-10-CM | POA: Diagnosis present

## 2022-05-22 DIAGNOSIS — M25512 Pain in left shoulder: Secondary | ICD-10-CM

## 2022-05-22 DIAGNOSIS — M25612 Stiffness of left shoulder, not elsewhere classified: Secondary | ICD-10-CM | POA: Diagnosis present

## 2022-05-22 NOTE — Therapy (Signed)
OUTPATIENT PHYSICAL THERAPY TREATMENT  Patient Name: Ariana Jefferson MRN: [id number] DOB:[birth year], 62 y.o., female Today's Date: 05/22/2022   PCP: Gayland Curry, MD REFERRING PROVIDER: Ann Lions, PA  END OF SESSION:   PT End of Session - 05/22/22 1550     Visit Number 29    Number of Visits 30    Date for PT Re-Evaluation 06/08/22    Authorization Type [employer] 2023, 60 combined PT/OT/Chiro    Authorization Time Period 12/12/21-02/10/22    Progress Note Due on Visit 10    PT Start Time [id number]    PT Stop Time 1629    PT Time Calculation (min) 42 min    Activity Tolerance Patient tolerated treatment well;Patient limited by pain    Behavior During Therapy WFL for tasks assessed/performed             Past Medical History:  Diagnosis Date   High cholesterol    Past Surgical History:  Procedure Laterality Date   [facility] GASTRIC SLEEVE RESECTION     Patient Active Problem List   Diagnosis Date Noted   Hx of skin cancer, basal cell 02/07/2018   Obesity 02/07/2018   High cholesterol 12/11/2016   Low vitamin B12 level 12/11/2016   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   Lung nodule seen on imaging study 11/13/2014   DJD (degenerative joint disease) 05/14/2013    REFERRING DIAG:  [id number] (ICD-10-CM) - S/P arthroscopy of left shoulder  Z01.818 (ICD-10-CM) - Encounter for other preprocedural examination    THERAPY DIAG:  Acute pain of left shoulder  Stiffness of left shoulder, not elsewhere classified  Muscle weakness (generalized)  Rationale for Evaluation and Treatment Rehabilitation  PERTINENT HISTORY: Pt is a 62 year old female s/p L shoulder arthroscopy with biceps tenodesis, resection of adhesions, SAD, DCE, debridement. Patient reports no post-op complications. Pt reports post-op pain is better than she anticipated; pt reports mild pain presently. Patient reports no instructions yet on  exercises to do with shoulder. Pt has weaned from opioid analgesic pain medicine; pt alternates Tylenol and Ibuprofen as needed. Pt denies recent physical trauma. Pt had emergency hemicholectomy earlier this year with good post-op recovery. Pt has been compliant with sling use as directed by her surgeon.       Patient Goals: Patient wants to return to golf, being able to remodel her home/paint, able to reach above head     PRECAUTIONS: No active biceps x 6 weeks, no resisted elbow flexion or supination, A/AAROM at 4 weeks post-op   DOS: 11/24/21   SUBJECTIVE:  SUBJECTIVE STATEMENT: Pt reports minimal pain at arrival this afternoon. Patient reports having comorbid back pain after working on her [place] this past weekend. Notable pain Sunday - somewhat better today. Pt denies use of moist heat or palliative care at arrival today.   PAIN:  Are you having pain? Pt reports mild soreness at arrival to PT   OBJECTIVE: (objective measures completed at initial evaluation unless otherwise dated)   Patient Surveys  Eval: FOTO: 29, predicted score of 60   Posture Mild forward head, protracted scapulae   AROM              AROM (Normal range in degrees) AROM 12/13/2021 AROM 01/30/22 AROM 03/13/22 AROM 04/05/22    Right Left Right Left Right Left Right Left  Shoulder            Flexion   Deferred WNL 127 WFL 128  119  Extension   Deferred WNL 72 WFL 64    Abduction   Deferred WNL 133 WFL 121  132  External Rotation   Deferred WNL 40 WFL 84  80 (arm at side) 82 (arm at 90 deg abduction)  Internal Rotation   Deferred WNL 68 WFL 70  WFL (arm at side) 38 (at 90 abduction)  Hands Behind Head   Deferred        Hands Behind Back   Deferred                     Elbow            Flexion            Extension             Pronation            Supination            (* = pain; Blank rows = not tested)     PROM            PROM (Normal range in degrees) PROM 12/13/2021 PROM 01/30/22 PROM 01/30/22 PROM 03/13/22 PROM 04/05/22    Right Left Right Left Left Left  Shoulder          Flexion   50 WNL 145 159 145  Extension          Abduction      11 0 130 110  External Rotation   15 WNL 50 62 64  Internal Rotation   45 WNL WNL WNL 67  Hands Behind Head          Hands Behind Back                     Elbow          Flexion   WNL  WNL    Extension   -10  0    Pronation   WNL  WNL    Supination   WNL  WNL    (* = pain; Blank rows = not tested)     LE MMT: MMT (out of 5) Right 12/13/2021 Left 12/13/2021 Right 01/30/22 Left 01/30/22 Left 04/05/22            Shoulder      Flexion     5 4 4   Extension         Abduction     5 4 4   External rotation     5 4- 4  Internal rotation     5 4+ 4*  Horizontal  abduction         Horizontal adduction         Lower Trapezius         Rhomboids                   Elbow     Flexion     5 4 4+  Extension     5 4 4   Pronation         Supination                   Wrist     Flexion         Extension         Radial deviation         Ulnar deviation                   (* = pain; Blank rows = not tested)    Palpation   Location LEFT  RIGHT           Subocciptials      Cervical paraspinals      Upper Trapezius 0    Levator Scapulae      Rhomboid Major/Minor      Sternoclavicular joint 0    Acromioclavicular joint 2    Coracoid process 2    Bicipital groove 2    Supraspinatus 1    Infraspinatus 0    Subscapularis      Teres Minor      Teres Major      Pectoralis Major 1    Pectoralis Minor      Anterior Deltoid 1    Lateral Deltoid 1    Posterior Deltoid 0    Latissimus Dorsi      Sternocleidomastoid      (Blank rows = not tested) Graded on 0-4 scale (0 = no pain, 1 = pain, 2 = pain with wincing/grimacing/flinching, 3 = pain with withdrawal,  4 = unwilling to allow palpation), (Blank rows = not tested)            TODAY'S TREATMENT   05/22/2022   Manual Therapy - for R shoulder ROM and to prevent R shoulder stiffness   L shoulder PROM into flexion, abduction, ER and IR as tolerated by patient with gentle overpressure into end-ROM; x 10 minutes Gentle GHJ mobilizations at gr I-II for pain control, inferior and A-P for 2x30 sec each   *not today* Gentle STM to L biceps, anterior and middle deltoid; x 2 minutes L elbow PROM for flexion/extension and pronation/supination; x 1 minutes Passive upper trapezius stretching/scapular depression; 3x30 sec Glenohumeral mobilizations  at gr III for improved mobility, inferior and posterior; 1x30 sec each     Therapeutic Exercise - for shoulder complex ROM as needed for ability to perform reaching and self-care/ADLs, strengthening to improve capacity for loading biceps and RTC and as needed for improved ability to perform lifting   Upper body ergometer, 2 minutes forward, 2 minutes backward - for tissue warm-up to improve muscle performance, improved soft tissue mobility/extensibility - subjective information gathered during this billed time  Standing serratus slide with foam roll, with yellow Tband at wrists; 2x10    Tband resisted ER walkout; Red Tband; 2x10  Tband high rows with Green Theraband, linked to 1st hook at top; 2x10  Tband resisted scaption, in standing; Red Tband; 2x8    Wand functional IR (hand behind back); x10   Functional IR reach, passing Dbell behind  back; x20 CW and CCW  PATIENT EDUCATION: Discussed continued POC with increasing emphasis on strengthening and attaining full ROM     *not today* Rhythmic stabilization with arm at 100 deg forward elevation; 2 x 1 minutes in supine Sidelying shoulder abduction AROM; 2x10 with surgical UE Wall slide, flexion and abduction; x10, 5 sec hold at top  Supine flexion AAROM with wand; 1x10, 5 sec hold, with 5-lb  cuff weight for overhead mobility  Standing Functional IR (hand behind back) AAROM with strap; x12, 5 sec hold   Standing, wand ER AAROM, with elbow propped to 90 deg on handrail (of staircase) in center of gym; x12 5 sec hold Supine flexion AROM; 2x10 Sidelying abduction AROM; 1x10 Finger ladder; x5, 10 sec for flexion today Supine ER AAROM with wand; 2x10, 5 sec hold Shoulder isometrics; shoulder flexion, ABD, and extension; x15 each direction, 5 sec - heavy demonstration and verbal/tactile cueing for technique Wand flexion AAROM in supine; 2x10 Ball roll up wall, yellow physioball; x5 Swiss ball roll across table, shoulder abduction; x15, Green physioball  Pulley; flexion, 2 minutes AAROM Pendulums, forward/backward, side-to-side circles CW and CCW; x20 each with well arm propped on edge of treatment table Scapular retraction; reviewed Gripping; pink putty; 2 minutes Wrist AROM, flexion/extension and RD/UD; reviewed Cold pack (unbilled) - for anti-inflammatory and analgesic effect as needed for reduced pain and improved ability to participate in active PT intervention, along R shoulder with R elbow propped on pillow, pt seated;  x 5 minutes    PATIENT EDUCATION:  Education details: see above for patient education details Person educated: Patient Education method: Explanation, Handout Education comprehension: verbalized understanding     HOME EXERCISE PROGRAM: Access Code: [email or web address] URL: [email or web address] Date: 04/05/2022 Prepared by: Valentina Gu  Exercises - Supine Shoulder Flexion with Dowel  - 2 x daily - 7 x weekly - 2 sets - 10 reps - Supine Shoulder Flexion Extension Full Range AROM  - 2 x daily - 7 x weekly - 2 sets - 10 reps - Supine Shoulder External Rotation in 45 Degrees Abduction AAROM with Dowel  - 2 x daily - 7 x weekly - 2 sets - 10 reps - Standing Shoulder Internal Rotation Stretch with Towel  - 2 x daily - 7 x weekly - 2 sets - 10 reps -  5sec hold - Shoulder Flexion Wall Slide with Towel  - 2 x daily - 7 x weekly - 2 sets - 5-10 reps - Standing Shoulder Abduction Slides at Wall  - 2 x daily - 7 x weekly - 2 sets - 5-10 reps       ASSESSMENT:   CLINICAL IMPRESSION: Patient is progressing well with ROM at this time in spite of significant stiffness and difficulty tolerating PROM and AAROM toward end of 2023 when pt was out of PT following hernia repair. Pt is able to progress further with strengthening at this time given pt well out of 6-week window following hernia repair for resistance exercise. She does still have marked weakness of L deltoid and posterior cuff, but pt is attaining functional ROM in most planes with primary limitation remaining in hand behind back/functional IR. Pt has remaining deficits in L shoulder strength (deltoid, RTC, periscapular mm), active and passive ROM, L shoulder stiffness, postural changes, and anterior shoulder/biceps pain intermittently with ROM work. Patient will benefit from continued skilled therapeutic intervention to address the above deficits as needed for improved function and QoL.     REHAB POTENTIAL:  Excellent   CLINICAL DECISION MAKING: Evolving/moderate complexity   EVALUATION COMPLEXITY: Low     GOALS: Goals reviewed with patient? Yes   SHORT TERM GOALS: Target date: 01/10/2022   Pt will be independent with HEP to improve strength and decrease neck pain to improve pain-free function at home and work. Baseline: 12/12/21: Baseline HEP initiated; 01/11/22: HEP recently updated 04/05/22: HEP updated today Goal status: Achieved;      LONG TERM GOALS: Target date: 02/07/2022   Pt will increase FOTO to at least 60 to demonstrate significant improvement in function at home and work related to neck pain  Baseline: 12/12/21: 29/60   01/11/22: 53/60    03/29/22: 56/60 Goal status: IN PROGRESS   2.   Pt will have L shoulder AROM at least within 10 degrees of contralateral upper  extremity indicative of improved ROM as needed for reaching, overhead work, self-care activities/dressing/grooming Baseline: 12/12/21: NO AROM presently, severely limited PROM in early post-op phase.   01/30/22: Shoulder IR WNL; pt is limited in all other planes of motion at this time    03/13/22: Pt has primary remaining AROM limitations in flexion and abduction.     04/05/22: Pt has remaining AROM limitations in flexion, abduction, IR (at 90 abduction), and ER (at 90 abduction),  Goal status: ON-GOING   3. Pt will improve L shoulder strength to at least 4+/5 or greater for all motions measured as needed for improved capacity to perform lifting and stabilize shoulder during working with power tools to complete home improvement projects and swing golf club       Baseline: 12/12/21: No MMTs due to early post-op status   01/30/22: MMT 4- to 4+      03/13/22: Deferred.  04/05/22: Gross strength 4/5 (see chart above) Goal status: ON-GOING   4. Pt will demonstrate golf swing with sound technique and no reproduction of shoulder pain as needed for return to golfing Baseline: 12/12/21: Unable to complete shoulder AROM or compound motion required for golf swing   01/30/22: Deferred.   03/13/22: Deferred. 04/05/22: deferred Goal status: DEFERRED     PLAN: PT FREQUENCY: 1-2x/week   PT DURATION:  4-6 weeks   PLANNED INTERVENTIONS: Therapeutic exercises, Therapeutic activity, Neuromuscular re-education, Patient/Family education, Joint mobilization, Dry Needling, Electrical stimulation, Cryotherapy, Moist heat, Traction, and Manual therapy   PLAN FOR NEXT SESSION: Continue with shoulder A/AAROM for gradually restoring full ROM. Continue with progressive RTC/periscapular and deltoid strengthening as tolerated, slow progression of biceps loading.  Re-assessment next visit.   Valentina Gu, PT, DPT [phone] 05/22/2022, 3:51 PM

## 2022-05-24 ENCOUNTER — Ambulatory Visit: Payer: Managed Care, Other (non HMO) | Admitting: Physical Therapy

## 2022-05-24 ENCOUNTER — Encounter: Payer: Self-pay | Admitting: Physical Therapy

## 2022-05-24 DIAGNOSIS — M25612 Stiffness of left shoulder, not elsewhere classified: Secondary | ICD-10-CM

## 2022-05-24 DIAGNOSIS — M25512 Pain in left shoulder: Secondary | ICD-10-CM | POA: Diagnosis not present

## 2022-05-24 DIAGNOSIS — M6281 Muscle weakness (generalized): Secondary | ICD-10-CM

## 2022-05-24 NOTE — Therapy (Signed)
OUTPATIENT PHYSICAL THERAPY TREATMENT AND PROGRESS NOTE/RE-CERTIFICATION   Dates of reporting period  04/05/22   to   05/24/22   Patient Name: Ariana Jefferson MRN: [id number] DOB:08/31/1960, 62 y.o., female Today's Date: 05/24/2022   PCP: Leim FabryBarbara Aldridge, MD REFERRING PROVIDER: Gunnar FusiMiranda Butler, PA  END OF SESSION:   PT End of Session - 05/24/22 1723     Visit Number 30    Number of Visits 30    Date for PT Re-Evaluation 06/08/22    Authorization Type [employer] 2023, 60 combined PT/OT/Chiro    Authorization Time Period 12/12/21-02/10/22    Progress Note Due on Visit 10    PT Start Time 1716    PT Stop Time 1758    PT Time Calculation (min) 42 min    Activity Tolerance Patient tolerated treatment well;Patient limited by pain    [facility] for tasks assessed/performed              Past Medical History:  Diagnosis Date   High cholesterol    Past Surgical History:  Procedure Laterality Date   CESAREAN SECTION     CHOLECYSTECTOMY     KNEE SURGERY     LAPAROSCOPIC GASTRIC SLEEVE RESECTION     Patient Active Problem List   Diagnosis Date Noted   Hx of skin cancer, basal cell 02/07/2018   Obesity 02/07/2018   High cholesterol 12/11/2016   Low vitamin B12 level 12/11/2016   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   Lung nodule seen on imaging study 11/13/2014   DJD (degenerative joint disease) 05/14/2013    REFERRING DIAG:  W09.811Z98.890 (ICD-10-CM) - S/P arthroscopy of left shoulder  Z01.818 (ICD-10-CM) - Encounter for other preprocedural examination    THERAPY DIAG:  Acute pain of left shoulder  Stiffness of left shoulder, not elsewhere classified  Muscle weakness (generalized)  Rationale for Evaluation and Treatment Rehabilitation  PERTINENT HISTORY: Pt is a 62 year old female s/p L shoulder arthroscopy with biceps tenodesis, resection of adhesions, SAD, DCE, debridement. Patient reports no post-op complications. Pt reports post-op pain is better  than she anticipated; pt reports mild pain presently. Patient reports no instructions yet on exercises to do with shoulder. Pt has weaned from opioid analgesic pain medicine; pt alternates Tylenol and Ibuprofen as needed. Pt denies recent physical trauma. Pt had emergency hemicholectomy earlier this year with good post-op recovery. Pt has been compliant with sling use as directed by her surgeon.       Patient Goals: Patient wants to return to golf, being able to remodel her home/paint, able to reach above head     PRECAUTIONS: No active biceps x 6 weeks, no resisted elbow flexion or supination, A/AAROM at 4 weeks post-op   DOS: 11/24/21   SUBJECTIVE:  SUBJECTIVE STATEMENT: Pt reports feeling generally well at arrival. She reports being compliant with HEP typically, but she did miss home exercises yesterday due to GI symptoms. Patient reports good progress to date and feels she can complete self-care ADLs and light household work relatively well. Pt does still have remaining L upper limb weakness. She does have intermittent crepitus with reaching and work above shoulder height.   PAIN:  Are you having pain? Pt reports mild soreness at arrival to PT   OBJECTIVE: (objective measures completed at initial evaluation unless otherwise dated)   Patient Surveys  Eval: FOTO: 29, predicted score of 60   Posture Mild forward head, protracted scapulae   AROM                AROM (Normal range in degrees) AROM 12/13/2021 AROM 01/30/22 AROM 03/13/22 AROM 04/05/22 AROM 05/24/22    Right Left Right Left Right Left Right Left Right Left  Shoulder              Flexion   Deferred WNL 127 WFL 128  119 165 147  Extension   Deferred WNL 72 WFL 64      Abduction   Deferred WNL 133 WFL 121  132 178 147  External Rotation    Deferred WNL 40 WFL 84  80 (arm at side) 82 (arm at 90 deg abduction) 81 WNL (arm at 90 deg abduction) 83 (arm at 90 deg abduction)  Internal Rotation   Deferred WNL 68 WFL 70  WFL (arm at side) 38 (at 90 abduction) WNL (arm at 90 deg abduction) 70 (arm at 90 deg abduction)  Hands Behind Head   Deferred          Hands Behind Back   Deferred       T7 L2                 Elbow              Flexion              Extension              Pronation              Supination              (* = pain; Blank rows = not tested)     PROM             PROM (Normal range in degrees) PROM 12/13/2021 PROM 01/30/22 PROM 01/30/22 PROM 03/13/22 PROM 04/05/22 PROM 05/24/22    Right Left Right Left Left Left Left  Shoulder           Flexion   50 WNL 145 159 145 158  Extension           Abduction      110 130 110 140  External Rotation   15 WNL 50 62 64 72  Internal Rotation   45 WNL WNL WNL 67 WNL  Hands Behind Head           Hands Behind Back                       Elbow           Flexion   WNL  WNL     Extension   -10  0     Pronation   WNL  WNL     Supination   WNL  WNL     (* =  pain; Blank rows = not tested)     LE MMT: MMT (out of 5) Right 12/13/2021 Left 12/13/2021 Right 01/30/22 Left 01/30/22 Left 04/05/22 Left 05/24/22             Shoulder       Flexion     5 4 4 4   Extension          Abduction     5 4 4 4   External rotation     5 4- 4 4+  Internal rotation     5 4+ 4* 4+  Horizontal abduction          Horizontal adduction          Lower Trapezius          Rhomboids                     Elbow      Flexion     5 4 4+ 5  Extension     5 4 4 5   Pronation          Supination                     Wrist      Flexion          Extension          Radial deviation          Ulnar deviation                     (* = pain; Blank rows = not tested)    Palpation   Location LEFT  RIGHT           Subocciptials      Cervical paraspinals      Upper Trapezius 0    Levator Scapulae       Rhomboid Major/Minor      Sternoclavicular joint 0    Acromioclavicular joint 2    Coracoid process 2    Bicipital groove 2    Supraspinatus 1    Infraspinatus 0    Subscapularis      Teres Minor      Teres Major      Pectoralis Major 1    Pectoralis Minor      Anterior Deltoid 1    Lateral Deltoid 1    Posterior Deltoid 0    Latissimus Dorsi      Sternocleidomastoid      (Blank rows = not tested) Graded on 0-4 scale (0 = no pain, 1 = pain, 2 = pain with wincing/grimacing/flinching, 3 = pain with withdrawal, 4 = unwilling to allow palpation), (Blank rows = not tested)            TODAY'S TREATMENT   05/24/2022   Manual Therapy - for R shoulder ROM and to prevent R shoulder stiffness   L shoulder PROM into flexion, abduction, ER and IR as tolerated by patient with gentle overpressure into end-ROM; x 10 minutes Gentle GHJ mobilizations at gr I-II for pain control, inferior and A-P for 2x30 sec each  Rhythmic stabilization with arm at 100 deg forward elevation; 2 x 1 minutes in supine  *not today* Gentle STM to L biceps, anterior and middle deltoid; x 2 minutes L elbow PROM for flexion/extension and pronation/supination; x 1 minutes Passive upper trapezius stretching/scapular depression; 3x30 sec Glenohumeral mobilizations  at gr III for improved mobility, inferior and posterior; 1x30 sec each  Therapeutic Exercise - for shoulder complex ROM as needed for ability to perform reaching and self-care/ADLs, strengthening to improve capacity for loading biceps and RTC and as needed for improved ability to perform lifting  *GOAL UPDATE PERFORMED  Upper body ergometer, 2 minutes forward, 2 minutes backward - for tissue warm-up to improve muscle performance, improved soft tissue mobility/extensibility - subjective information gathered during portion of this time, 2 minutes not billed  Standing serratus slide with foam roll, with yellow Tband at wrists; 2x10    Wall Y/lower  trapezius lift-off with Red Tband; 2x10    Tband resisted ER walkout; Red Tband; 2x10    Seated lat pulldown with Nautilus; single long bar; 50-lbs; 2x10   PATIENT EDUCATION: Discussed current progress and continued POC with focus on recovering end-range flexion and abduction AROM as well as functional IR with increasing emphasis on strengthening; we discussed tapering frequency of PT as ROM normalizes and strength improves.     *next visit* Wand functional IR (hand behind back); x10 Functional IR reach, passing Dbell behind back; x20 CW and CCW   *not today* Tband resisted scaption, in standing; Red Tband; 2x8 Sidelying shoulder abduction AROM; 2x10 with surgical UE Wall slide, flexion and abduction; x10, 5 sec hold at top  Supine flexion AAROM with wand; 1x10, 5 sec hold, with 5-lb cuff weight for overhead mobility  Standing Functional IR (hand behind back) AAROM with strap; x12, 5 sec hold   Standing, wand ER AAROM, with elbow propped to 90 deg on handrail (of staircase) in center of gym; x12 5 sec hold Supine flexion AROM; 2x10 Sidelying abduction AROM; 1x10 Finger ladder; x5, 10 sec for flexion today Supine ER AAROM with wand; 2x10, 5 sec hold Shoulder isometrics; shoulder flexion, ABD, and extension; x15 each direction, 5 sec - heavy demonstration and verbal/tactile cueing for technique Wand flexion AAROM in supine; 2x10 Ball roll up wall, yellow physioball; x5 Swiss ball roll across table, shoulder abduction; x15, Green physioball  Pulley; flexion, 2 minutes AAROM Pendulums, forward/backward, side-to-side circles CW and CCW; x20 each with well arm propped on edge of treatment table Scapular retraction; reviewed Gripping; pink putty; 2 minutes Wrist AROM, flexion/extension and RD/UD; reviewed Cold pack (unbilled) - for anti-inflammatory and analgesic effect as needed for reduced pain and improved ability to participate in active PT intervention, along R shoulder with R elbow  propped on pillow, pt seated;  x 5 minutes    PATIENT EDUCATION:  Education details: see above for patient education details Person educated: Patient Education method: Explanation, Handout Education comprehension: verbalized understanding     HOME EXERCISE PROGRAM: Access Code: [email or web address] URL: [email or web address] Date: 04/05/2022 Prepared by: Consuela Mimes  Exercises - Supine Shoulder Flexion with Dowel  - 2 x daily - 7 x weekly - 2 sets - 10 reps - Supine Shoulder Flexion Extension Full Range AROM  - 2 x daily - 7 x weekly - 2 sets - 10 reps - Supine Shoulder External Rotation in 45 Degrees Abduction AAROM with Dowel  - 2 x daily - 7 x weekly - 2 sets - 10 reps - Standing Shoulder Internal Rotation Stretch with Towel  - 2 x daily - 7 x weekly - 2 sets - 10 reps - 5sec hold - Shoulder Flexion Wall Slide with Towel  - 2 x daily - 7 x weekly - 2 sets - 5-10 reps - Standing Shoulder Abduction Slides at Wall  - 2 x daily - 7 x weekly - 2 sets -  5-10 reps       ASSESSMENT:   CLINICAL IMPRESSION: Patient has made notable progress over previous 2 months with largely normalizing L shoulder passive ROM. She has moderate deficit remaining with shoulder flexion and abduction in standing as well as functional IR (hand behind back). Pain is largely minimal; pt has undergone steroid taper to reduce inflammatory response along surgical region. She has improved MMT for posterior cuff musculature. She has remaining strength deficits in long-lever flexion and abduction for L shoulder. Pt has met FOTO goal; she needs continued work on L shoulder active ROM (primarily shoulder elevation and hand behind back motion) and strengthening. Pt is able to perform putting and chipping motion at this time in limited ROM; however, she lacks horizontal abduction ROM and posterior cuff strength for L-handed backswing. Pt has remaining deficits in L shoulder strength (deltoid, RTC, periscapular mm),  active and passive ROM, L shoulder stiffness, postural changes, and anterior shoulder/biceps pain intermittently with ROM work. Patient will benefit from continued skilled therapeutic intervention to address the above deficits as needed for improved function and QoL.     REHAB POTENTIAL: Excellent   CLINICAL DECISION MAKING: Evolving/moderate complexity   EVALUATION COMPLEXITY: Low     GOALS: Goals reviewed with patient? Yes   SHORT TERM GOALS: Target date: 01/10/2022   Pt will be independent with HEP to improve strength and decrease neck pain to improve pain-free function at home and work. Baseline: 12/12/21: Baseline HEP initiated; 01/11/22: HEP recently updated 04/05/22: HEP updated today Goal status: Achieved;      LONG TERM GOALS: Target date: 02/07/2022   Pt will increase FOTO to at least 60 to demonstrate significant improvement in function at home and work related to neck pain  Baseline: 12/12/21: 29/60   01/11/22: 53/60    03/29/22: 56/60    05/24/22: 63/60 Goal status: Achieved;   2.   Pt will have L shoulder AROM at least within 10 degrees of contralateral upper extremity indicative of improved ROM as needed for reaching, overhead work, self-care activities/dressing/grooming Baseline: 12/12/21: NO AROM presently, severely limited PROM in early post-op phase.   01/30/22: Shoulder IR WNL; pt is limited in all other planes of motion at this time    03/13/22: Pt has primary remaining AROM limitations in flexion and abduction.     04/05/22: Pt has remaining AROM limitations in flexion, abduction, IR (at 90 abduction), and ER (at 90 abduction).   05/24/22: AROM deficits in flexion and abduction Goal status: ON-GOING   3. Pt will improve L shoulder strength to at least 4+/5 or greater for all motions measured as needed for improved capacity to perform lifting and stabilize shoulder during working with power tools to complete home improvement projects and swing golf club       Baseline:  12/12/21: No MMTs due to early post-op status   01/30/22: MMT 4- to 4+      03/13/22: Deferred.  04/05/22: Gross strength 4/5 (see chart above).   05/24/22: L shoulder 4 to 4+/5, biceps 5/5 Goal status: IN PROGRESS   4. Pt will demonstrate golf swing with sound technique and no reproduction of shoulder pain as needed for return to golfing Baseline: 12/12/21: Unable to complete shoulder AROM or compound motion required for golf swing   01/30/22: Deferred.   03/13/22: Deferred. 04/05/22: deferred.   05/24/22: Completed chipping motion with golf club without notable pain; pt primarily concerned with limited backswing AROM.  Goal status: IN PROGRESS  PLAN: PT FREQUENCY: 1-2x/week   PT DURATION:  4-6 weeks   PLANNED INTERVENTIONS: Therapeutic exercises, Therapeutic activity, Neuromuscular re-education, Patient/Family education, Joint mobilization, Dry Needling, Electrical stimulation, Cryotherapy, Moist heat, Traction, and Manual therapy   PLAN FOR NEXT SESSION: Restoration of end-range flexion and abduction AROM, hand behind back motion (functional IR most limited); continue with RTC and long-lever strengthening progressively for shoulder flexors (starting with light to moderate loads). Taper PT visits as ROM gets closer to symmetry with opposite upper limb and strength improves.     Consuela Mimes, PT, DPT [phone] 05/24/2022, 5:23 PM

## 2022-05-29 ENCOUNTER — Ambulatory Visit: Payer: Managed Care, Other (non HMO) | Admitting: Physical Therapy

## 2022-05-29 NOTE — Therapy (Deleted)
OUTPATIENT PHYSICAL THERAPY TREATMENT   Patient Name: Ariana Jefferson MRN: [id number] DOB:07-Dec-1960, 62 y.o., female Today's Date: 05/29/2022   PCP: Leim Fabry, MD REFERRING PROVIDER: Gunnar Fusi, PA  END OF SESSION:      Past Medical History:  Diagnosis Date   High cholesterol    Past Surgical History:  Procedure Laterality Date   CESAREAN SECTION     CHOLECYSTECTOMY     KNEE SURGERY     LAPAROSCOPIC GASTRIC SLEEVE RESECTION     Patient Active Problem List   Diagnosis Date Noted   Hx of skin cancer, basal cell 02/07/2018   Obesity 02/07/2018   High cholesterol 12/11/2016   Low vitamin B12 level 12/11/2016   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   Lung nodule seen on imaging study 11/13/2014   DJD (degenerative joint disease) 05/14/2013    REFERRING DIAG:  O75.643 (ICD-10-CM) - S/P arthroscopy of left shoulder  Z01.818 (ICD-10-CM) - Encounter for other preprocedural examination    THERAPY DIAG:  Acute pain of left shoulder  Stiffness of left shoulder, not elsewhere classified  Muscle weakness (generalized)  Rationale for Evaluation and Treatment Rehabilitation  PERTINENT HISTORY: Pt is a 62 year old female s/p L shoulder arthroscopy with biceps tenodesis, resection of adhesions, SAD, DCE, debridement. Patient reports no post-op complications. Pt reports post-op pain is better than she anticipated; pt reports mild pain presently. Patient reports no instructions yet on exercises to do with shoulder. Pt has weaned from opioid analgesic pain medicine; pt alternates Tylenol and Ibuprofen as needed. Pt denies recent physical trauma. Pt had emergency hemicholectomy earlier this year with good post-op recovery. Pt has been compliant with sling use as directed by her surgeon.       Patient Goals: Patient wants to return to golf, being able to remodel her home/paint, able to reach above head     PRECAUTIONS: No active biceps x 6 weeks, no resisted elbow  flexion or supination, A/AAROM at 4 weeks post-op   DOS: 11/24/21   SUBJECTIVE:                                                                                                                                                                                      SUBJECTIVE STATEMENT: Pt reports feeling generally well at arrival. She reports being compliant with HEP typically, but she did miss home exercises yesterday due to GI symptoms. Patient reports good progress to date and feels she can complete self-care ADLs and light household work relatively well. Pt does still have remaining L upper limb weakness. She does have intermittent crepitus with reaching and work above shoulder height.   PAIN:  Are you having  pain? Pt reports mild soreness at arrival to PT   OBJECTIVE: (objective measures completed at initial evaluation unless otherwise dated)   Patient Surveys  Eval: FOTO: 29, predicted score of 60   Posture Mild forward head, protracted scapulae   AROM                AROM (Normal range in degrees) AROM 12/13/2021 AROM 01/30/22 AROM 03/13/22 AROM 04/05/22 AROM 05/24/22    Right Left Right Left Right Left Right Left Right Left  Shoulder              Flexion   Deferred WNL 127 WFL 128  119 165 147  Extension   Deferred WNL 72 WFL 64      Abduction   Deferred WNL 133 WFL 121  132 178 147  External Rotation   Deferred WNL 40 WFL 84  80 (arm at side) 82 (arm at 90 deg abduction) 81 WNL (arm at 90 deg abduction) 83 (arm at 90 deg abduction)  Internal Rotation   Deferred WNL 68 WFL 70  WFL (arm at side) 38 (at 90 abduction) WNL (arm at 90 deg abduction) 70 (arm at 90 deg abduction)  Hands Behind Head   Deferred          Hands Behind Back   Deferred       T7 L2                 Elbow              Flexion              Extension              Pronation              Supination              (* = pain; Blank rows = not tested)     PROM             PROM (Normal range in degrees)  PROM 12/13/2021 PROM 01/30/22 PROM 01/30/22 PROM 03/13/22 PROM 04/05/22 PROM 05/24/22    Right Left Right Left Left Left Left  Shoulder           Flexion   50 WNL 145 159 145 158  Extension           Abduction      110 130 110 140  External Rotation   15 WNL 50 62 64 72  Internal Rotation   45 WNL WNL WNL 67 WNL  Hands Behind Head           Hands Behind Back                       Elbow           Flexion   WNL  WNL     Extension   -10  0     Pronation   WNL  WNL     Supination   WNL  WNL     (* = pain; Blank rows = not tested)     LE MMT: MMT (out of 5) Right 12/13/2021 Left 12/13/2021 Right 01/30/22 Left 01/30/22 Left 04/05/22 Left 05/24/22             Shoulder       Flexion     5 4 4 4   Extension          Abduction  5 4 4 4   External rotation     5 4- 4 4+  Internal rotation     5 4+ 4* 4+  Horizontal abduction          Horizontal adduction          Lower Trapezius          Rhomboids                     Elbow      Flexion     5 4 4+ 5  Extension     5 4 4 5   Pronation          Supination                     Wrist      Flexion          Extension          Radial deviation          Ulnar deviation                     (* = pain; Blank rows = not tested)    Palpation   Location LEFT  RIGHT           Subocciptials      Cervical paraspinals      Upper Trapezius 0    Levator Scapulae      Rhomboid Major/Minor      Sternoclavicular joint 0    Acromioclavicular joint 2    Coracoid process 2    Bicipital groove 2    Supraspinatus 1    Infraspinatus 0    Subscapularis      Teres Minor      Teres Major      Pectoralis Major 1    Pectoralis Minor      Anterior Deltoid 1    Lateral Deltoid 1    Posterior Deltoid 0    Latissimus Dorsi      Sternocleidomastoid      (Blank rows = not tested) Graded on 0-4 scale (0 = no pain, 1 = pain, 2 = pain with wincing/grimacing/flinching, 3 = pain with withdrawal, 4 = unwilling to allow palpation), (Blank rows =  not tested)            TODAY'S TREATMENT   05/29/2022   Manual Therapy - for R shoulder ROM and to prevent R shoulder stiffness   L shoulder PROM into flexion, abduction, ER and IR as tolerated by patient with gentle overpressure into end-ROM; x 10 minutes Gentle GHJ mobilizations at gr I-II for pain control, inferior and A-P for 2x30 sec each  Rhythmic stabilization with arm at 100 deg forward elevation; 2 x 1 minutes in supine  *not today* Gentle STM to L biceps, anterior and middle deltoid; x 2 minutes L elbow PROM for flexion/extension and pronation/supination; x 1 minutes Passive upper trapezius stretching/scapular depression; 3x30 sec Glenohumeral mobilizations  at gr III for improved mobility, inferior and posterior; 1x30 sec each     Therapeutic Exercise - for shoulder complex ROM as needed for ability to perform reaching and self-care/ADLs, strengthening to improve capacity for loading biceps and RTC and as needed for improved ability to perform lifting  *GOAL UPDATE PERFORMED  Upper body ergometer, 2 minutes forward, 2 minutes backward - for tissue warm-up to improve muscle performance, improved soft tissue mobility/extensibility - subjective information gathered during portion of this time,  2 minutes not billed  Standing serratus slide with foam roll, with yellow Tband at wrists; 2x10    Wall Y/lower trapezius lift-off with Red Tband; 2x10    Tband resisted ER walkout; Red Tband; 2x10    Seated lat pulldown with Nautilus; single long bar; 50-lbs; 2x10   PATIENT EDUCATION: Discussed current progress and continued POC with focus on recovering end-range flexion and abduction AROM as well as functional IR with increasing emphasis on strengthening; we discussed tapering frequency of PT as ROM normalizes and strength improves.     *next visit* Wand functional IR (hand behind back); x10 Functional IR reach, passing Dbell behind back; x20 CW and CCW   *not  today* Tband resisted scaption, in standing; Red Tband; 2x8 Sidelying shoulder abduction AROM; 2x10 with surgical UE Wall slide, flexion and abduction; x10, 5 sec hold at top  Supine flexion AAROM with wand; 1x10, 5 sec hold, with 5-lb cuff weight for overhead mobility  Standing Functional IR (hand behind back) AAROM with strap; x12, 5 sec hold   Standing, wand ER AAROM, with elbow propped to 90 deg on handrail (of staircase) in center of gym; x12 5 sec hold Supine flexion AROM; 2x10 Sidelying abduction AROM; 1x10 Finger ladder; x5, 10 sec for flexion today Supine ER AAROM with wand; 2x10, 5 sec hold Shoulder isometrics; shoulder flexion, ABD, and extension; x15 each direction, 5 sec - heavy demonstration and verbal/tactile cueing for technique Wand flexion AAROM in supine; 2x10 Ball roll up wall, yellow physioball; x5 Swiss ball roll across table, shoulder abduction; x15, Green physioball  Pulley; flexion, 2 minutes AAROM Pendulums, forward/backward, side-to-side circles CW and CCW; x20 each with well arm propped on edge of treatment table Scapular retraction; reviewed Gripping; pink putty; 2 minutes Wrist AROM, flexion/extension and RD/UD; reviewed Cold pack (unbilled) - for anti-inflammatory and analgesic effect as needed for reduced pain and improved ability to participate in active PT intervention, along R shoulder with R elbow propped on pillow, pt seated;  x 5 minutes    PATIENT EDUCATION:  Education details: see above for patient education details Person educated: Patient Education method: Explanation, Handout Education comprehension: verbalized understanding     HOME EXERCISE PROGRAM: Access Code: [email or web address] URL: [email or web address] Date: 04/05/2022 Prepared by: Consuela MimesJeremy Marlissa Emerick  Exercises - Supine Shoulder Flexion with Dowel  - 2 x daily - 7 x weekly - 2 sets - 10 reps - Supine Shoulder Flexion Extension Full Range AROM  - 2 x daily - 7 x weekly - 2 sets  - 10 reps - Supine Shoulder External Rotation in 45 Degrees Abduction AAROM with Dowel  - 2 x daily - 7 x weekly - 2 sets - 10 reps - Standing Shoulder Internal Rotation Stretch with Towel  - 2 x daily - 7 x weekly - 2 sets - 10 reps - 5sec hold - Shoulder Flexion Wall Slide with Towel  - 2 x daily - 7 x weekly - 2 sets - 5-10 reps - Standing Shoulder Abduction Slides at Wall  - 2 x daily - 7 x weekly - 2 sets - 5-10 reps       ASSESSMENT:   CLINICAL IMPRESSION: Patient has made notable progress over previous 2 months with largely normalizing L shoulder passive ROM. She has moderate deficit remaining with shoulder flexion and abduction in standing as well as functional IR (hand behind back). Pain is largely minimal; pt has undergone steroid taper to reduce inflammatory response along surgical region. She has improved  MMT for posterior cuff musculature. She has remaining strength deficits in long-lever flexion and abduction for L shoulder. Pt has met FOTO goal; she needs continued work on L shoulder active ROM (primarily shoulder elevation and hand behind back motion) and strengthening. Pt is able to perform putting and chipping motion at this time in limited ROM; however, she lacks horizontal abduction ROM and posterior cuff strength for L-handed backswing. Pt has remaining deficits in L shoulder strength (deltoid, RTC, periscapular mm), active and passive ROM, L shoulder stiffness, postural changes, and anterior shoulder/biceps pain intermittently with ROM work. Patient will benefit from continued skilled therapeutic intervention to address the above deficits as needed for improved function and QoL.     REHAB POTENTIAL: Excellent   CLINICAL DECISION MAKING: Evolving/moderate complexity   EVALUATION COMPLEXITY: Low     GOALS: Goals reviewed with patient? Yes   SHORT TERM GOALS: Target date: 01/10/2022   Pt will be independent with HEP to improve strength and decrease neck pain to improve  pain-free function at home and work. Baseline: 12/12/21: Baseline HEP initiated; 01/11/22: HEP recently updated 04/05/22: HEP updated today Goal status: Achieved;      LONG TERM GOALS: Target date: 02/07/2022   Pt will increase FOTO to at least 60 to demonstrate significant improvement in function at home and work related to neck pain  Baseline: 12/12/21: 29/60   01/11/22: 53/60    03/29/22: 56/60    05/24/22: 63/60 Goal status: Achieved;   2.   Pt will have L shoulder AROM at least within 10 degrees of contralateral upper extremity indicative of improved ROM as needed for reaching, overhead work, self-care activities/dressing/grooming Baseline: 12/12/21: NO AROM presently, severely limited PROM in early post-op phase.   01/30/22: Shoulder IR WNL; pt is limited in all other planes of motion at this time    03/13/22: Pt has primary remaining AROM limitations in flexion and abduction.     04/05/22: Pt has remaining AROM limitations in flexion, abduction, IR (at 90 abduction), and ER (at 90 abduction).   05/24/22: AROM deficits in flexion and abduction Goal status: ON-GOING   3. Pt will improve L shoulder strength to at least 4+/5 or greater for all motions measured as needed for improved capacity to perform lifting and stabilize shoulder during working with power tools to complete home improvement projects and swing golf club       Baseline: 12/12/21: No MMTs due to early post-op status   01/30/22: MMT 4- to 4+      03/13/22: Deferred.  04/05/22: Gross strength 4/5 (see chart above).   05/24/22: L shoulder 4 to 4+/5, biceps 5/5 Goal status: IN PROGRESS   4. Pt will demonstrate golf swing with sound technique and no reproduction of shoulder pain as needed for return to golfing Baseline: 12/12/21: Unable to complete shoulder AROM or compound motion required for golf swing   01/30/22: Deferred.   03/13/22: Deferred. 04/05/22: deferred.   05/24/22: Completed chipping motion with golf club without notable pain; pt  primarily concerned with limited backswing AROM.  Goal status: IN PROGRESS     PLAN: PT FREQUENCY: 1-2x/week   PT DURATION:  4-6 weeks   PLANNED INTERVENTIONS: Therapeutic exercises, Therapeutic activity, Neuromuscular re-education, Patient/Family education, Joint mobilization, Dry Needling, Electrical stimulation, Cryotherapy, Moist heat, Traction, and Manual therapy   PLAN FOR NEXT SESSION: Restoration of end-range flexion and abduction AROM, hand behind back motion (functional IR most limited); continue with RTC and long-lever strengthening progressively for shoulder flexors (  starting with light to moderate loads). Taper PT visits as ROM gets closer to symmetry with opposite upper limb and strength improves.     Consuela Mimes, PT, DPT [phone] 05/29/2022, 9:32 AM

## 2022-05-31 ENCOUNTER — Ambulatory Visit: Payer: Managed Care, Other (non HMO) | Admitting: Physical Therapy

## 2022-05-31 ENCOUNTER — Encounter: Payer: Self-pay | Admitting: Physical Therapy

## 2022-05-31 DIAGNOSIS — M6281 Muscle weakness (generalized): Secondary | ICD-10-CM

## 2022-05-31 DIAGNOSIS — M25512 Pain in left shoulder: Secondary | ICD-10-CM

## 2022-05-31 DIAGNOSIS — M25612 Stiffness of left shoulder, not elsewhere classified: Secondary | ICD-10-CM

## 2022-05-31 NOTE — Therapy (Signed)
OUTPATIENT PHYSICAL THERAPY TREATMENT   Patient Name: Ariana Jefferson MRN: [id number] DOB:04/02/1960, 62 y.o., female Today's Date: 05/31/2022   PCP: Leim FabryBarbara Aldridge, MD REFERRING PROVIDER: Gunnar FusiMiranda Butler, PA  END OF SESSION:   PT End of Session - 05/31/22 1722     Visit Number 31    Number of Visits 38    Date for PT Re-Evaluation 07/06/22    Authorization Type [employer] 2023, 60 combined PT/OT/Chiro    Progress Note Due on Visit 38    PT Start Time 1718    PT Stop Time 1800    PT Time Calculation (min) 42 min    Activity Tolerance Patient tolerated treatment well;Patient limited by pain    Behavior During Therapy WFL for tasks assessed/performed               Past Medical History:  Diagnosis Date   High cholesterol    Past Surgical History:  Procedure Laterality Date   CESAREAN SECTION     CHOLECYSTECTOMY     KNEE SURGERY     LAPAROSCOPIC GASTRIC SLEEVE RESECTION     Patient Active Problem List   Diagnosis Date Noted   Hx of skin cancer, basal cell 02/07/2018   Obesity 02/07/2018   High cholesterol 12/11/2016   Low vitamin B12 level 12/11/2016   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   Lung nodule seen on imaging study 11/13/2014   DJD (degenerative joint disease) 05/14/2013    REFERRING DIAG:  O13.086Z98.890 (ICD-10-CM) - S/P arthroscopy of left shoulder  Z01.818 (ICD-10-CM) - Encounter for other preprocedural examination    THERAPY DIAG:  Acute pain of left shoulder  Stiffness of left shoulder, not elsewhere classified  Muscle weakness (generalized)  Rationale for Evaluation and Treatment Rehabilitation  PERTINENT HISTORY: Pt is a 62 year old female s/p L shoulder arthroscopy with biceps tenodesis, resection of adhesions, SAD, DCE, debridement. Patient reports no post-op complications. Pt reports post-op pain is better than she anticipated; pt reports mild pain presently. Patient reports no instructions yet on exercises to do with shoulder. Pt has weaned  from opioid analgesic pain medicine; pt alternates Tylenol and Ibuprofen as needed. Pt denies recent physical trauma. Pt had emergency hemicholectomy earlier this year with good post-op recovery. Pt has been compliant with sling use as directed by her surgeon.       Patient Goals: Patient wants to return to golf, being able to remodel her home/paint, able to reach above head     PRECAUTIONS: No active biceps x 6 weeks, no resisted elbow flexion or supination, A/AAROM at 4 weeks post-op   DOS: 11/24/21   SUBJECTIVE:  SUBJECTIVE STATEMENT: Pt reports she can perform all self-care ADLs or household tasks she needs to do mostly. Patient reports she is somewhat limited with sweeping left-handed.   PAIN:  Are you having pain? Pt reports mild soreness at arrival to PT   OBJECTIVE: (objective measures completed at initial evaluation unless otherwise dated)   Patient Surveys  Eval: FOTO: 29, predicted score of 60   Posture Mild forward head, protracted scapulae   AROM                AROM (Normal range in degrees) AROM 12/13/2021 AROM 01/30/22 AROM 03/13/22 AROM 04/05/22 AROM 05/24/22    Right Left Right Left Right Left Right Left Right Left  Shoulder              Flexion   Deferred WNL 127 WFL 128  119 165 147  Extension   Deferred WNL 72 WFL 64      Abduction   Deferred WNL 133 WFL 121  132 178 147  External Rotation   Deferred WNL 40 WFL 84  80 (arm at side) 82 (arm at 90 deg abduction) 81 WNL (arm at 90 deg abduction) 83 (arm at 90 deg abduction)  Internal Rotation   Deferred WNL 68 WFL 70  WFL (arm at side) 38 (at 90 abduction) WNL (arm at 90 deg abduction) 70 (arm at 90 deg abduction)  Hands Behind Head   Deferred          Hands Behind Back   Deferred       T7 L2                 Elbow               Flexion              Extension              Pronation              Supination              (* = pain; Blank rows = not tested)     PROM             PROM (Normal range in degrees) PROM 12/13/2021 PROM 01/30/22 PROM 01/30/22 PROM 03/13/22 PROM 04/05/22 PROM 05/24/22    Right Left Right Left Left Left Left  Shoulder           Flexion   50 WNL 145 159 145 158  Extension           Abduction      110 130 110 140  External Rotation   15 WNL 50 62 64 72  Internal Rotation   45 WNL WNL WNL 67 WNL  Hands Behind Head           Hands Behind Back                       Elbow           Flexion   WNL  WNL     Extension   -10  0     Pronation   WNL  WNL     Supination   WNL  WNL     (* = pain; Blank rows = not tested)     LE MMT: MMT (out of 5) Right 12/13/2021 Left 12/13/2021 Right 01/30/22 Left 01/30/22 Left 04/05/22 Left 05/24/22  Shoulder       Flexion     5 4 4 4   Extension          Abduction     5 4 4 4   External rotation     5 4- 4 4+  Internal rotation     5 4+ 4* 4+  Horizontal abduction          Horizontal adduction          Lower Trapezius          Rhomboids                     Elbow      Flexion     5 4 4+ 5  Extension     5 4 4 5   Pronation          Supination                     Wrist      Flexion          Extension          Radial deviation          Ulnar deviation                     (* = pain; Blank rows = not tested)    Palpation   Location LEFT  RIGHT           Subocciptials      Cervical paraspinals      Upper Trapezius 0    Levator Scapulae      Rhomboid Major/Minor      Sternoclavicular joint 0    Acromioclavicular joint 2    Coracoid process 2    Bicipital groove 2    Supraspinatus 1    Infraspinatus 0    Subscapularis      Teres Minor      Teres Major      Pectoralis Major 1    Pectoralis Minor      Anterior Deltoid 1    Lateral Deltoid 1    Posterior Deltoid 0    Latissimus Dorsi      Sternocleidomastoid       (Blank rows = not tested) Graded on 0-4 scale (0 = no pain, 1 = pain, 2 = pain with wincing/grimacing/flinching, 3 = pain with withdrawal, 4 = unwilling to allow palpation), (Blank rows = not tested)            TODAY'S TREATMENT   05/31/2022   Manual Therapy - for R shoulder ROM and to prevent R shoulder stiffness   L shoulder PROM into flexion, abduction, ER and IR as tolerated by patient with gentle overpressure into end-ROM; x 5 minutes Gentle GHJ mobilizations at gr I-II for pain control, inferior and A-P for 2x30 sec each Gentle STM to L biceps, anterior and middle deltoid; x 1 minute  *not today* Rhythmic stabilization with arm at 100 deg forward elevation; 2 x 1 minutes in supine L elbow PROM for flexion/extension and pronation/supination; x 1 minutes Passive upper trapezius stretching/scapular depression; 3x30 sec Glenohumeral mobilizations  at gr III for improved mobility, inferior and posterior; 1x30 sec each     Therapeutic Exercise - for shoulder complex ROM as needed for ability to perform reaching and self-care/ADLs, strengthening to improve capacity for loading biceps and RTC and as needed for improved ability to perform lifting  Upper body ergometer,  2 minutes forward, 2 minutes backward - for tissue warm-up to improve muscle performance, improved soft tissue mobility/extensibility - subjective information gathered during portion of this time, 2 minutes not billed  Standing serratus slide with foam roll, with yellow Tband at wrists; 2x10    Wall Y/lower trapezius lift-off with Red Tband; 2x6    Standing bodyblade, oscillations medial lateral with arm sustained at 90 deg shoulder flexion; 2x20 sec    Tband resisted ER walkout; Red Tband; 2x10    Seated lat pulldown with Nautilus; single long bar; 60-lbs; 2x10  Standing resisted shoulder flexion, elbows fully extended; 3-lb; 2x8  PATIENT EDUCATION: Discussed current progress and continued POC   *next  visit* Wand functional IR (hand behind back); x10 Functional IR reach, passing Dbell behind back; x20 CW and CCW   *not today* Tband resisted scaption, in standing; Red Tband; 2x8 Sidelying shoulder abduction AROM; 2x10 with surgical UE Wall slide, flexion and abduction; x10, 5 sec hold at top  Supine flexion AAROM with wand; 1x10, 5 sec hold, with 5-lb cuff weight for overhead mobility  Standing Functional IR (hand behind back) AAROM with strap; x12, 5 sec hold   Standing, wand ER AAROM, with elbow propped to 90 deg on handrail (of staircase) in center of gym; x12 5 sec hold Supine flexion AROM; 2x10 Sidelying abduction AROM; 1x10 Finger ladder; x5, 10 sec for flexion today Supine ER AAROM with wand; 2x10, 5 sec hold Shoulder isometrics; shoulder flexion, ABD, and extension; x15 each direction, 5 sec - heavy demonstration and verbal/tactile cueing for technique Wand flexion AAROM in supine; 2x10 Ball roll up wall, yellow physioball; x5 Swiss ball roll across table, shoulder abduction; x15, Green physioball  Pulley; flexion, 2 minutes AAROM Pendulums, forward/backward, side-to-side circles CW and CCW; x20 each with well arm propped on edge of treatment table Scapular retraction; reviewed Gripping; pink putty; 2 minutes Wrist AROM, flexion/extension and RD/UD; reviewed Cold pack (unbilled) - for anti-inflammatory and analgesic effect as needed for reduced pain and improved ability to participate in active PT intervention, along R shoulder with R elbow propped on pillow, pt seated;  x 5 minutes    PATIENT EDUCATION:  Education details: see above for patient education details Person educated: Patient Education method: Explanation, Handout Education comprehension: verbalized understanding     HOME EXERCISE PROGRAM: Access Code: [email or web address] URL: [email or web address] Date: 04/05/2022 Prepared by: Consuela Mimes  Exercises - Supine Shoulder Flexion with Dowel  - 2 x  daily - 7 x weekly - 2 sets - 10 reps - Supine Shoulder Flexion Extension Full Range AROM  - 2 x daily - 7 x weekly - 2 sets - 10 reps - Supine Shoulder External Rotation in 45 Degrees Abduction AAROM with Dowel  - 2 x daily - 7 x weekly - 2 sets - 10 reps - Standing Shoulder Internal Rotation Stretch with Towel  - 2 x daily - 7 x weekly - 2 sets - 10 reps - 5sec hold - Shoulder Flexion Wall Slide with Towel  - 2 x daily - 7 x weekly - 2 sets - 5-10 reps - Standing Shoulder Abduction Slides at Wall  - 2 x daily - 7 x weekly - 2 sets - 5-10 reps       ASSESSMENT:   CLINICAL IMPRESSION: Patient is notably challenged with resisted shoulder flexion with elbow fully extended; she does have remaining deficit with accessing full overhead range (though pt does have WFL PROM at this time with mild end-range  tightness reported). We further progressed strengthening and RTC re-training today with pt reporting notable fatigue and moderate soreness post-treatment. Pt has remaining deficits in L shoulder strength (deltoid, RTC, periscapular mm), active and passive ROM, L shoulder stiffness, postural changes, and anterior shoulder/biceps pain intermittently with ROM work. Patient will benefit from continued skilled therapeutic intervention to address the above deficits as needed for improved function and QoL.     REHAB POTENTIAL: Excellent   CLINICAL DECISION MAKING: Evolving/moderate complexity   EVALUATION COMPLEXITY: Low     GOALS: Goals reviewed with patient? Yes   SHORT TERM GOALS: Target date: 01/10/2022   Pt will be independent with HEP to improve strength and decrease neck pain to improve pain-free function at home and work. Baseline: 12/12/21: Baseline HEP initiated; 01/11/22: HEP recently updated 04/05/22: HEP updated today Goal status: Achieved;      LONG TERM GOALS: Target date: 02/07/2022   Pt will increase FOTO to at least 60 to demonstrate significant improvement in function at home  and work related to neck pain  Baseline: 12/12/21: 29/60   01/11/22: 53/60    03/29/22: 56/60    05/24/22: 63/60 Goal status: Achieved;   2.   Pt will have L shoulder AROM at least within 10 degrees of contralateral upper extremity indicative of improved ROM as needed for reaching, overhead work, self-care activities/dressing/grooming Baseline: 12/12/21: NO AROM presently, severely limited PROM in early post-op phase.   01/30/22: Shoulder IR WNL; pt is limited in all other planes of motion at this time    03/13/22: Pt has primary remaining AROM limitations in flexion and abduction.     04/05/22: Pt has remaining AROM limitations in flexion, abduction, IR (at 90 abduction), and ER (at 90 abduction).   05/24/22: AROM deficits in flexion and abduction Goal status: ON-GOING   3. Pt will improve L shoulder strength to at least 4+/5 or greater for all motions measured as needed for improved capacity to perform lifting and stabilize shoulder during working with power tools to complete home improvement projects and swing golf club       Baseline: 12/12/21: No MMTs due to early post-op status   01/30/22: MMT 4- to 4+      03/13/22: Deferred.  04/05/22: Gross strength 4/5 (see chart above).   05/24/22: L shoulder 4 to 4+/5, biceps 5/5 Goal status: IN PROGRESS   4. Pt will demonstrate golf swing with sound technique and no reproduction of shoulder pain as needed for return to golfing Baseline: 12/12/21: Unable to complete shoulder AROM or compound motion required for golf swing   01/30/22: Deferred.   03/13/22: Deferred. 04/05/22: deferred.   05/24/22: Completed chipping motion with golf club without notable pain; pt primarily concerned with limited backswing AROM.  Goal status: IN PROGRESS     PLAN: PT FREQUENCY: 1-2x/week   PT DURATION:  4-6 weeks   PLANNED INTERVENTIONS: Therapeutic exercises, Therapeutic activity, Neuromuscular re-education, Patient/Family education, Joint mobilization, Dry Needling, Electrical  stimulation, Cryotherapy, Moist heat, Traction, and Manual therapy   PLAN FOR NEXT SESSION: Restoration of end-range flexion and abduction AROM, hand behind back motion (functional IR most limited); continue with RTC and long-lever strengthening progressively for shoulder flexors (starting with light to moderate loads). Taper PT visits as ROM gets closer to symmetry with opposite upper limb and strength improves.     Consuela Mimes, PT, DPT [phone] 05/31/2022, 5:23 PM

## 2022-06-07 ENCOUNTER — Ambulatory Visit: Payer: Managed Care, Other (non HMO) | Admitting: Physical Therapy

## 2022-06-12 ENCOUNTER — Ambulatory Visit: Payer: Managed Care, Other (non HMO) | Admitting: Physical Therapy

## 2022-06-12 ENCOUNTER — Encounter: Payer: Self-pay | Admitting: Physical Therapy

## 2022-06-12 DIAGNOSIS — M6281 Muscle weakness (generalized): Secondary | ICD-10-CM

## 2022-06-12 DIAGNOSIS — M25612 Stiffness of left shoulder, not elsewhere classified: Secondary | ICD-10-CM

## 2022-06-12 DIAGNOSIS — M25512 Pain in left shoulder: Secondary | ICD-10-CM | POA: Diagnosis not present

## 2022-06-12 NOTE — Therapy (Signed)
OUTPATIENT PHYSICAL THERAPY TREATMENT   Patient Name: Ariana Jefferson MRN: [id number] DOB:10-19-1960, 62 y.o., female Today's Date: 06/12/2022   PCP: Leim Fabry, MD REFERRING PROVIDER: Gunnar Fusi, PA  END OF SESSION:   PT End of Session - 06/12/22 1629     Visit Number 32    Number of Visits 38    Date for PT Re-Evaluation 07/06/22    Authorization Type [employer] 2023, 60 combined PT/OT/Chiro    Progress Note Due on Visit 38    PT Start Time 1635    PT Stop Time 1715    PT Time Calculation (min) 40 min    Activity Tolerance Patient tolerated treatment well;Patient limited by pain    Behavior During Therapy WFL for tasks assessed/performed               Past Medical History:  Diagnosis Date   High cholesterol    Past Surgical History:  Procedure Laterality Date   CESAREAN SECTION     CHOLECYSTECTOMY     KNEE SURGERY     LAPAROSCOPIC GASTRIC SLEEVE RESECTION     Patient Active Problem List   Diagnosis Date Noted   Hx of skin cancer, basal cell 02/07/2018   Obesity 02/07/2018   High cholesterol 12/11/2016   Low vitamin B12 level 12/11/2016   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   Lung nodule seen on imaging study 11/13/2014   DJD (degenerative joint disease) 05/14/2013    REFERRING DIAG:  V95.638 (ICD-10-CM) - S/P arthroscopy of left shoulder  Z01.818 (ICD-10-CM) - Encounter for other preprocedural examination    THERAPY DIAG:  Acute pain of left shoulder  Stiffness of left shoulder, not elsewhere classified  Muscle weakness (generalized)  Rationale for Evaluation and Treatment Rehabilitation  PERTINENT HISTORY: Pt is a 62 year old female s/p L shoulder arthroscopy with biceps tenodesis, resection of adhesions, SAD, DCE, debridement. Patient reports no post-op complications. Pt reports post-op pain is better than she anticipated; pt reports mild pain presently. Patient reports no instructions yet on exercises to do with shoulder. Pt has weaned  from opioid analgesic pain medicine; pt alternates Tylenol and Ibuprofen as needed. Pt denies recent physical trauma. Pt had emergency hemicholectomy earlier this year with good post-op recovery. Pt has been compliant with sling use as directed by her surgeon.       Patient Goals: Patient wants to return to golf, being able to remodel her home/paint, able to reach above head     PRECAUTIONS: No active biceps x 6 weeks, no resisted elbow flexion or supination, A/AAROM at 4 weeks post-op   DOS: 11/24/21   SUBJECTIVE:  SUBJECTIVE STATEMENT: Pt reports no new complaints this afternoon. She reports ongoing difficulty with going behind her back that she feels has progressed minimally. She reports compliance with her HEP.   PAIN:  Are you having pain? Pt reports mild soreness at arrival to PT   OBJECTIVE: (objective measures completed at initial evaluation unless otherwise dated)   Patient Surveys  Eval: FOTO: 29, predicted score of 60   Posture Mild forward head, protracted scapulae   AROM                AROM (Normal range in degrees) AROM 12/13/2021 AROM 01/30/22 AROM 03/13/22 AROM 04/05/22 AROM 05/24/22    Right Left Right Left Right Left Right Left Right Left  Shoulder              Flexion   Deferred WNL 127 WFL 128  119 165 147  Extension   Deferred WNL 72 WFL 64      Abduction   Deferred WNL 133 WFL 121  132 178 147  External Rotation   Deferred WNL 40 WFL 84  80 (arm at side) 82 (arm at 90 deg abduction) 81 WNL (arm at 90 deg abduction) 83 (arm at 90 deg abduction)  Internal Rotation   Deferred WNL 68 WFL 70  WFL (arm at side) 38 (at 90 abduction) WNL (arm at 90 deg abduction) 70 (arm at 90 deg abduction)  Hands Behind Head   Deferred          Hands Behind Back   Deferred       T7 L2                  Elbow              Flexion              Extension              Pronation              Supination              (* = pain; Blank rows = not tested)     PROM             PROM (Normal range in degrees) PROM 12/13/2021 PROM 01/30/22 PROM 01/30/22 PROM 03/13/22 PROM 04/05/22 PROM 05/24/22    Right Left Right Left Left Left Left  Shoulder           Flexion   50 WNL 145 159 145 158  Extension           Abduction      110 130 110 140  External Rotation   15 WNL 50 62 64 72  Internal Rotation   45 WNL WNL WNL 67 WNL  Hands Behind Head           Hands Behind Back                       Elbow           Flexion   WNL  WNL     Extension   -10  0     Pronation   WNL  WNL     Supination   WNL  WNL     (* = pain; Blank rows = not tested)     LE MMT: MMT (out of 5) Right 12/13/2021 Left 12/13/2021 Right 01/30/22 Left 01/30/22 Left 04/05/22 Left 05/24/22  Shoulder       Flexion     5 4 4 4   Extension          Abduction     5 4 4 4   External rotation     5 4- 4 4+  Internal rotation     5 4+ 4* 4+  Horizontal abduction          Horizontal adduction          Lower Trapezius          Rhomboids                     Elbow      Flexion     5 4 4+ 5  Extension     5 4 4 5   Pronation          Supination                     Wrist      Flexion          Extension          Radial deviation          Ulnar deviation                     (* = pain; Blank rows = not tested)    Palpation   Location LEFT  RIGHT           Subocciptials      Cervical paraspinals      Upper Trapezius 0    Levator Scapulae      Rhomboid Major/Minor      Sternoclavicular joint 0    Acromioclavicular joint 2    Coracoid process 2    Bicipital groove 2    Supraspinatus 1    Infraspinatus 0    Subscapularis      Teres Minor      Teres Major      Pectoralis Major 1    Pectoralis Minor      Anterior Deltoid 1    Lateral Deltoid 1    Posterior Deltoid 0    Latissimus Dorsi       Sternocleidomastoid      (Blank rows = not tested) Graded on 0-4 scale (0 = no pain, 1 = pain, 2 = pain with wincing/grimacing/flinching, 3 = pain with withdrawal, 4 = unwilling to allow palpation), (Blank rows = not tested)            TODAY'S TREATMENT   06/12/2022   Manual Therapy - for R shoulder ROM and to prevent R shoulder stiffness   L shoulder PROM into flexion, abduction, ER and IR as tolerated by patient with gentle overpressure into end-ROM; x 5 minutes Gentle GHJ mobilizations at gr I-II for pain control, inferior and A-P for 2x30 sec each Gentle STM to L biceps, anterior and middle deltoid; x 1 minute  *not today* Rhythmic stabilization with arm at 100 deg forward elevation; 2 x 1 minutes in supine L elbow PROM for flexion/extension and pronation/supination; x 1 minutes Passive upper trapezius stretching/scapular depression; 3x30 sec Glenohumeral mobilizations  at gr III for improved mobility, inferior and posterior; 1x30 sec each     Therapeutic Exercise - for shoulder complex ROM as needed for ability to perform reaching and self-care/ADLs, strengthening to improve capacity for loading biceps and RTC and as needed for improved ability to perform lifting  Upper body ergometer,  2 minutes forward, 2 minutes backward - for tissue warm-up to improve muscle performance, improved soft tissue mobility/extensibility - subjective information gathered during portion of this time, 1 minute not billed     Wall Y/lower trapezius lift-off with Red Tband; 2x10    Wand functional IR (hand behind back); x10    Standing bodyblade, oscillations medial lateral with arm sustained at 90 deg shoulder flexion; 2x20 sec    Tband resisted ER; Green Tband; 2x10    Seated lat pulldown with Nautilus; single long bar; 70-lbs; 2x10  Standing resisted shoulder flexion, elbows fully extended; 3-lb; 2x8  Functional IR with IR isometric at back followed by additional functional IR (hand  behind back) AAROM; 2 x 4 bouts   PATIENT EDUCATION: Discussed current progress and continued POC   *next visit* Functional IR reach, passing Dbell behind back; x20 CW and CCW   *not today* Standing serratus slide with foam roll, with yellow Tband at wrists; 2x10 Tband resisted scaption, in standing; Red Tband; 2x8 Sidelying shoulder abduction AROM; 2x10 with surgical UE Wall slide, flexion and abduction; x10, 5 sec hold at top  Supine flexion AAROM with wand; 1x10, 5 sec hold, with 5-lb cuff weight for overhead mobility  Standing Functional IR (hand behind back) AAROM with strap; x12, 5 sec hold   Standing, wand ER AAROM, with elbow propped to 90 deg on handrail (of staircase) in center of gym; x12 5 sec hold Supine flexion AROM; 2x10 Sidelying abduction AROM; 1x10 Finger ladder; x5, 10 sec for flexion today Supine ER AAROM with wand; 2x10, 5 sec hold Shoulder isometrics; shoulder flexion, ABD, and extension; x15 each direction, 5 sec - heavy demonstration and verbal/tactile cueing for technique Wand flexion AAROM in supine; 2x10 Ball roll up wall, yellow physioball; x5 Swiss ball roll across table, shoulder abduction; x15, Green physioball  Pulley; flexion, 2 minutes AAROM Pendulums, forward/backward, side-to-side circles CW and CCW; x20 each with well arm propped on edge of treatment table Scapular retraction; reviewed Gripping; pink putty; 2 minutes Wrist AROM, flexion/extension and RD/UD; reviewed Cold pack (unbilled) - for anti-inflammatory and analgesic effect as needed for reduced pain and improved ability to participate in active PT intervention, along R shoulder with R elbow propped on pillow, pt seated;  x 5 minutes    PATIENT EDUCATION:  Education details: see above for patient education details Person educated: Patient Education method: Explanation, Handout Education comprehension: verbalized understanding     HOME EXERCISE PROGRAM: Access Code: [email or web address] URL:  [email or web address] Date: 04/05/2022 Prepared by: Consuela Mimes  Exercises - Supine Shoulder Flexion with Dowel  - 2 x daily - 7 x weekly - 2 sets - 10 reps - Supine Shoulder Flexion Extension Full Range AROM  - 2 x daily - 7 x weekly - 2 sets - 10 reps - Supine Shoulder External Rotation in 45 Degrees Abduction AAROM with Dowel  - 2 x daily - 7 x weekly - 2 sets - 10 reps - Standing Shoulder Internal Rotation Stretch with Towel  - 2 x daily - 7 x weekly - 2 sets - 10 reps - 5sec hold - Shoulder Flexion Wall Slide with Towel  - 2 x daily - 7 x weekly - 2 sets - 5-10 reps - Standing Shoulder Abduction Slides at Wall  - 2 x daily - 7 x weekly - 2 sets - 5-10 reps       ASSESSMENT:   CLINICAL IMPRESSION: Patient is progressing well with ROM; she has mild asymmetry  compared to R (contralateral to surgery) upper limb with end-range shoulder elevation and she inadvertently flexes her L elbow to further elevate surgical UE. She has been able to readily progress with strengthening. Pt is still markedly limited with LUE functional internal rotation. We utilized agonist contraction for muscle-energy technique today with modest increase in motion noted; pt does have notable fleeting discomfort with functional IR/hand behind back motion. Pt has remaining deficits in L shoulder strength (deltoid, RTC, periscapular mm), active and passive ROM, L shoulder stiffness, postural changes, and anterior shoulder/biceps pain intermittently with ROM work. Patient will benefit from continued skilled therapeutic intervention to address the above deficits as needed for improved function and QoL.     REHAB POTENTIAL: Excellent   CLINICAL DECISION MAKING: Evolving/moderate complexity   EVALUATION COMPLEXITY: Low     GOALS: Goals reviewed with patient? Yes   SHORT TERM GOALS: Target date: 01/10/2022   Pt will be independent with HEP to improve strength and decrease neck pain to improve pain-free  function at home and work. Baseline: 12/12/21: Baseline HEP initiated; 01/11/22: HEP recently updated 04/05/22: HEP updated today Goal status: Achieved;      LONG TERM GOALS: Target date: 02/07/2022   Pt will increase FOTO to at least 60 to demonstrate significant improvement in function at home and work related to neck pain  Baseline: 12/12/21: 29/60   01/11/22: 53/60    03/29/22: 56/60    05/24/22: 63/60 Goal status: Achieved;   2.   Pt will have L shoulder AROM at least within 10 degrees of contralateral upper extremity indicative of improved ROM as needed for reaching, overhead work, self-care activities/dressing/grooming Baseline: 12/12/21: NO AROM presently, severely limited PROM in early post-op phase.   01/30/22: Shoulder IR WNL; pt is limited in all other planes of motion at this time    03/13/22: Pt has primary remaining AROM limitations in flexion and abduction.     04/05/22: Pt has remaining AROM limitations in flexion, abduction, IR (at 90 abduction), and ER (at 90 abduction).   05/24/22: AROM deficits in flexion and abduction Goal status: ON-GOING   3. Pt will improve L shoulder strength to at least 4+/5 or greater for all motions measured as needed for improved capacity to perform lifting and stabilize shoulder during working with power tools to complete home improvement projects and swing golf club       Baseline: 12/12/21: No MMTs due to early post-op status   01/30/22: MMT 4- to 4+      03/13/22: Deferred.  04/05/22: Gross strength 4/5 (see chart above).   05/24/22: L shoulder 4 to 4+/5, biceps 5/5 Goal status: IN PROGRESS   4. Pt will demonstrate golf swing with sound technique and no reproduction of shoulder pain as needed for return to golfing Baseline: 12/12/21: Unable to complete shoulder AROM or compound motion required for golf swing   01/30/22: Deferred.   03/13/22: Deferred. 04/05/22: deferred.   05/24/22: Completed chipping motion with golf club without notable pain; pt primarily  concerned with limited backswing AROM.  Goal status: IN PROGRESS     PLAN: PT FREQUENCY: 1-2x/week   PT DURATION:  4-6 weeks   PLANNED INTERVENTIONS: Therapeutic exercises, Therapeutic activity, Neuromuscular re-education, Patient/Family education, Joint mobilization, Dry Needling, Electrical stimulation, Cryotherapy, Moist heat, Traction, and Manual therapy   PLAN FOR NEXT SESSION: Restoration of end-range flexion and abduction AROM, hand behind back motion (functional IR most limited); continue with RTC and long-lever strengthening progressively for shoulder flexors (starting  with light to moderate loads). Taper PT visits as ROM gets closer to symmetry with opposite upper limb and strength improves.     Consuela Mimes, PT, DPT [phone] 06/12/2022, 4:48 PM

## 2022-06-14 ENCOUNTER — Ambulatory Visit: Payer: Managed Care, Other (non HMO) | Admitting: Physical Therapy

## 2022-06-14 DIAGNOSIS — M6281 Muscle weakness (generalized): Secondary | ICD-10-CM

## 2022-06-14 DIAGNOSIS — M25612 Stiffness of left shoulder, not elsewhere classified: Secondary | ICD-10-CM

## 2022-06-14 DIAGNOSIS — M25512 Pain in left shoulder: Secondary | ICD-10-CM | POA: Diagnosis not present

## 2022-06-14 NOTE — Therapy (Signed)
OUTPATIENT PHYSICAL THERAPY TREATMENT   Patient Name: Ariana Jefferson MRN: [id number] DOB:18-Dec-1960, 62 y.o., female Today's Date: 06/14/2022   PCP: Leim Fabry, MD REFERRING PROVIDER: Gunnar Fusi, PA  END OF SESSION:   PT End of Session - 06/19/22 1201     Visit Number 33    Number of Visits 38    Date for PT Re-Evaluation 07/06/22    Authorization Type [employer] 2023, 60 combined PT/OT/Chiro    Progress Note Due on Visit 38    PT Start Time 1639    PT Stop Time 1730    PT Time Calculation (min) 51 min    Activity Tolerance Patient tolerated treatment well;Patient limited by pain    Behavior During Therapy WFL for tasks assessed/performed               Past Medical History:  Diagnosis Date   High cholesterol    Past Surgical History:  Procedure Laterality Date   CESAREAN SECTION     CHOLECYSTECTOMY     KNEE SURGERY     LAPAROSCOPIC GASTRIC SLEEVE RESECTION     Patient Active Problem List   Diagnosis Date Noted   Hx of skin cancer, basal cell 02/07/2018   Obesity 02/07/2018   High cholesterol 12/11/2016   Low vitamin B12 level 12/11/2016   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   Lung nodule seen on imaging study 11/13/2014   DJD (degenerative joint disease) 05/14/2013    REFERRING DIAG:  W09.811 (ICD-10-CM) - S/P arthroscopy of left shoulder  Z01.818 (ICD-10-CM) - Encounter for other preprocedural examination    THERAPY DIAG:  Acute pain of left shoulder  Stiffness of left shoulder, not elsewhere classified  Muscle weakness (generalized)  Rationale for Evaluation and Treatment Rehabilitation  PERTINENT HISTORY: Pt is a 62 year old female s/p L shoulder arthroscopy with biceps tenodesis, resection of adhesions, SAD, DCE, debridement. Patient reports no post-op complications. Pt reports post-op pain is better than she anticipated; pt reports mild pain presently. Patient reports no instructions yet on exercises to do with shoulder. Pt has weaned  from opioid analgesic pain medicine; pt alternates Tylenol and Ibuprofen as needed. Pt denies recent physical trauma. Pt had emergency hemicholectomy earlier this year with good post-op recovery. Pt has been compliant with sling use as directed by her surgeon.       Patient Goals: Patient wants to return to golf, being able to remodel her home/paint, able to reach above head     PRECAUTIONS: No active biceps x 6 weeks, no resisted elbow flexion or supination, A/AAROM at 4 weeks post-op   DOS: 11/24/21   SUBJECTIVE:  SUBJECTIVE STATEMENT: Pt reports notable soreness after Monday's visit. She reports still feeling some residual soreness today. She reports being most limited with hand behind back motion. She is compliant with her HEP.    PAIN:  Are you having pain? Pt reports mild soreness at arrival to PT   OBJECTIVE: (objective measures completed at initial evaluation unless otherwise dated)   Patient Surveys  Eval: FOTO: 29, predicted score of 60   Posture Mild forward head, protracted scapulae   AROM                AROM (Normal range in degrees) AROM 12/13/2021 AROM 01/30/22 AROM 03/13/22 AROM 04/05/22 AROM 05/24/22    Right Left Right Left Right Left Right Left Right Left  Shoulder              Flexion   Deferred WNL 127 WFL 128  119 165 147  Extension   Deferred WNL 72 WFL 64      Abduction   Deferred WNL 133 WFL 121  132 178 147  External Rotation   Deferred WNL 40 WFL 84  80 (arm at side) 82 (arm at 90 deg abduction) 81 WNL (arm at 90 deg abduction) 83 (arm at 90 deg abduction)  Internal Rotation   Deferred WNL 68 WFL 70  WFL (arm at side) 38 (at 90 abduction) WNL (arm at 90 deg abduction) 70 (arm at 90 deg abduction)  Hands Behind Head   Deferred          Hands Behind Back   Deferred       T7  L2                 Elbow              Flexion              Extension              Pronation              Supination              (* = pain; Blank rows = not tested)     PROM             PROM (Normal range in degrees) PROM 12/13/2021 PROM 01/30/22 PROM 01/30/22 PROM 03/13/22 PROM 04/05/22 PROM 05/24/22    Right Left Right Left Left Left Left  Shoulder           Flexion   50 WNL 145 159 145 158  Extension           Abduction      110 130 110 140  External Rotation   15 WNL 50 62 64 72  Internal Rotation   45 WNL WNL WNL 67 WNL  Hands Behind Head           Hands Behind Back                       Elbow           Flexion   WNL  WNL     Extension   -10  0     Pronation   WNL  WNL     Supination   WNL  WNL     (* = pain; Blank rows = not tested)     LE MMT: MMT (out of 5) Right 12/13/2021 Left 12/13/2021 Right 01/30/22 Left 01/30/22 Left 04/05/22 Left 05/24/22  Shoulder       Flexion     5 4 4 4   Extension          Abduction     5 4 4 4   External rotation     5 4- 4 4+  Internal rotation     5 4+ 4* 4+  Horizontal abduction          Horizontal adduction          Lower Trapezius          Rhomboids                     Elbow      Flexion     5 4 4+ 5  Extension     5 4 4 5   Pronation          Supination                     Wrist      Flexion          Extension          Radial deviation          Ulnar deviation                     (* = pain; Blank rows = not tested)    Palpation   Location LEFT  RIGHT           Subocciptials      Cervical paraspinals      Upper Trapezius 0    Levator Scapulae      Rhomboid Major/Minor      Sternoclavicular joint 0    Acromioclavicular joint 2    Coracoid process 2    Bicipital groove 2    Supraspinatus 1    Infraspinatus 0    Subscapularis      Teres Minor      Teres Major      Pectoralis Major 1    Pectoralis Minor      Anterior Deltoid 1    Lateral Deltoid 1    Posterior Deltoid 0    Latissimus  Dorsi      Sternocleidomastoid      (Blank rows = not tested) Graded on 0-4 scale (0 = no pain, 1 = pain, 2 = pain with wincing/grimacing/flinching, 3 = pain with withdrawal, 4 = unwilling to allow palpation), (Blank rows = not tested)            TODAY'S TREATMENT   06/14/2022   Manual Therapy - for R shoulder ROM and to prevent R shoulder stiffness   L shoulder PROM into flexion, abduction, ER and IR as tolerated by patient with gentle overpressure into end-ROM; x 5 minutes Gentle GHJ mobilizations at gr I-II for pain control, inferior and A-P for 2x30 sec each  Rhythmic stabilization with arm at 100 deg forward elevation; 2 x 1 minutes in supine  *not today* Gentle STM to L biceps, anterior and middle deltoid; x 1 minute L elbow PROM for flexion/extension and pronation/supination; x 1 minutes Passive upper trapezius stretching/scapular depression; 3x30 sec Glenohumeral mobilizations  at gr III for improved mobility, inferior and posterior; 1x30 sec each     Therapeutic Exercise - for shoulder complex ROM as needed for ability to perform reaching and self-care/ADLs, strengthening to improve capacity for loading biceps and RTC and as needed for improved ability to perform lifting  Upper body  ergometer, 2 minutes forward, 2 minutes backward - for tissue warm-up to improve muscle performance, improved soft tissue mobility/extensibility - subjective information gathered during portion of this time, 2 minutes not billed    Wall Y/lower trapezius lift-off with Red Tband; 2x10    Wand functional IR (hand behind back); x10    Standing bodyblade, oscillations medial lateral with arm sustained at 90 deg shoulder flexion; 2x30 sec each    Tband resisted ER; Green Tband; 2x10    Seated lat pulldown with Nautilus; single long bar; 70-lbs; 2x12      Standing staggered stance, bar row; 50-lbs; 2x10  Standing resisted shoulder flexion, elbows fully extended; 3-lb; 2x10  Functional IR  with eccentric; Green Tband to improve functional IR AROM; x10    -functional IR improved from L3 to T12  Functional IR AAROM with dowel; 1x10, 5 sec hold   PATIENT EDUCATION: Discussed role of PT with remainder of visits with pt approaching end-phase of rehab   *next visit* Functional IR reach, passing Dbell behind back; x20 CW and CCW   *not today* Standing serratus slide with foam roll, with yellow Tband at wrists; 2x10 Tband resisted scaption, in standing; Red Tband; 2x8 Sidelying shoulder abduction AROM; 2x10 with surgical UE Wall slide, flexion and abduction; x10, 5 sec hold at top  Supine flexion AAROM with wand; 1x10, 5 sec hold, with 5-lb cuff weight for overhead mobility  Standing Functional IR (hand behind back) AAROM with strap; x12, 5 sec hold   Standing, wand ER AAROM, with elbow propped to 90 deg on handrail (of staircase) in center of gym; x12 5 sec hold Supine flexion AROM; 2x10 Sidelying abduction AROM; 1x10 Finger ladder; x5, 10 sec for flexion today Supine ER AAROM with wand; 2x10, 5 sec hold Shoulder isometrics; shoulder flexion, ABD, and extension; x15 each direction, 5 sec - heavy demonstration and verbal/tactile cueing for technique Wand flexion AAROM in supine; 2x10 Ball roll up wall, yellow physioball; x5 Swiss ball roll across table, shoulder abduction; x15, Green physioball  Pulley; flexion, 2 minutes AAROM Pendulums, forward/backward, side-to-side circles CW and CCW; x20 each with well arm propped on edge of treatment table Scapular retraction; reviewed Gripping; pink putty; 2 minutes Wrist AROM, flexion/extension and RD/UD; reviewed Cold pack (unbilled) - for anti-inflammatory and analgesic effect as needed for reduced pain and improved ability to participate in active PT intervention, along R shoulder with R elbow propped on pillow, pt seated;  x 5 minutes    PATIENT EDUCATION:  Education details: see above for patient education details Person  educated: Patient Education method: Explanation, Handout Education comprehension: verbalized understanding     HOME EXERCISE PROGRAM: Access Code: [email or web address] URL: [email or web address] Date: 04/05/2022 Prepared by: Consuela Mimes  Exercises - Supine Shoulder Flexion with Dowel  - 2 x daily - 7 x weekly - 2 sets - 10 reps - Supine Shoulder Flexion Extension Full Range AROM  - 2 x daily - 7 x weekly - 2 sets - 10 reps - Supine Shoulder External Rotation in 45 Degrees Abduction AAROM with Dowel  - 2 x daily - 7 x weekly - 2 sets - 10 reps - Standing Shoulder Internal Rotation Stretch with Towel  - 2 x daily - 7 x weekly - 2 sets - 10 reps - 5sec hold - Shoulder Flexion Wall Slide with Towel  - 2 x daily - 7 x weekly - 2 sets - 5-10 reps - Standing Shoulder Abduction Slides at Wall  - 2  x daily - 7 x weekly - 2 sets - 5-10 reps       ASSESSMENT:   CLINICAL IMPRESSION: Patient still has apparent weakness of prime movers of shoulder and RTC. She demonstrates mild deficit remaining for L shoulder elevation, though motion has substantially improved over the previous month. Pt is able to improve functional IR ROM by 3 levels following eccentric ER technique with hand behind back. Patient tolerates session well in spite of higher volume of exercise than usual. Pt has remaining deficits in L shoulder strength (deltoid, RTC, periscapular mm), active and passive ROM, L shoulder stiffness, postural changes, and anterior shoulder/biceps pain intermittently with ROM work. Patient will benefit from continued skilled therapeutic intervention to address the above deficits as needed for improved function and QoL.     REHAB POTENTIAL: Excellent   CLINICAL DECISION MAKING: Evolving/moderate complexity   EVALUATION COMPLEXITY: Low     GOALS: Goals reviewed with patient? Yes   SHORT TERM GOALS: Target date: 01/10/2022   Pt will be independent with HEP to improve strength and decrease  neck pain to improve pain-free function at home and work. Baseline: 12/12/21: Baseline HEP initiated; 01/11/22: HEP recently updated 04/05/22: HEP updated today Goal status: Achieved;      LONG TERM GOALS: Target date: 02/07/2022   Pt will increase FOTO to at least 60 to demonstrate significant improvement in function at home and work related to neck pain  Baseline: 12/12/21: 29/60   01/11/22: 53/60    03/29/22: 56/60    05/24/22: 63/60 Goal status: Achieved;   2.   Pt will have L shoulder AROM at least within 10 degrees of contralateral upper extremity indicative of improved ROM as needed for reaching, overhead work, self-care activities/dressing/grooming Baseline: 12/12/21: NO AROM presently, severely limited PROM in early post-op phase.   01/30/22: Shoulder IR WNL; pt is limited in all other planes of motion at this time    03/13/22: Pt has primary remaining AROM limitations in flexion and abduction.     04/05/22: Pt has remaining AROM limitations in flexion, abduction, IR (at 90 abduction), and ER (at 90 abduction).   05/24/22: AROM deficits in flexion and abduction Goal status: ON-GOING   3. Pt will improve L shoulder strength to at least 4+/5 or greater for all motions measured as needed for improved capacity to perform lifting and stabilize shoulder during working with power tools to complete home improvement projects and swing golf club       Baseline: 12/12/21: No MMTs due to early post-op status   01/30/22: MMT 4- to 4+      03/13/22: Deferred.  04/05/22: Gross strength 4/5 (see chart above).   05/24/22: L shoulder 4 to 4+/5, biceps 5/5 Goal status: IN PROGRESS   4. Pt will demonstrate golf swing with sound technique and no reproduction of shoulder pain as needed for return to golfing Baseline: 12/12/21: Unable to complete shoulder AROM or compound motion required for golf swing   01/30/22: Deferred.   03/13/22: Deferred. 04/05/22: deferred.   05/24/22: Completed chipping motion with golf club without  notable pain; pt primarily concerned with limited backswing AROM.  Goal status: IN PROGRESS     PLAN: PT FREQUENCY: 1-2x/week   PT DURATION:  4-6 weeks   PLANNED INTERVENTIONS: Therapeutic exercises, Therapeutic activity, Neuromuscular re-education, Patient/Family education, Joint mobilization, Dry Needling, Electrical stimulation, Cryotherapy, Moist heat, Traction, and Manual therapy   PLAN FOR NEXT SESSION: Restoration of end-range flexion and abduction AROM, hand behind back  motion (functional IR most limited); continue with RTC and long-lever strengthening progressively for shoulder flexors (starting with light to moderate loads). Taper PT visits as ROM gets closer to symmetry with opposite upper limb and strength improves.     Consuela Mimes, PT, DPT [id number] 06/19/2022, 12:01 PM

## 2022-06-16 ENCOUNTER — Encounter: Admit: 2022-06-16 | Payer: PRIVATE HEALTH INSURANCE | Attending: Internal Medicine | Primary: Internal Medicine

## 2022-06-16 MED ORDER — DEXMETHYLPHENIDATE ER 20 MG CAPSULE,EXTENDED RELEASE BIPHASIC50-50
20 | ORAL_CAPSULE | ORAL | 1 refills | 30.00000 days | Status: AC
Start: 2022-06-16 — End: 2022-07-20

## 2022-06-16 MED ORDER — DEXMETHYLPHENIDATE 10 MG TABLET
10 | ORAL_TABLET | Freq: Two times a day (BID) | ORAL | 1 refills | 30.00000 days | Status: AC
Start: 2022-06-16 — End: 2022-07-20

## 2022-06-19 ENCOUNTER — Ambulatory Visit: Payer: Managed Care, Other (non HMO) | Admitting: Physical Therapy

## 2022-06-19 ENCOUNTER — Encounter: Payer: Self-pay | Admitting: Physical Therapy

## 2022-06-19 DIAGNOSIS — M6281 Muscle weakness (generalized): Secondary | ICD-10-CM

## 2022-06-19 DIAGNOSIS — M25512 Pain in left shoulder: Secondary | ICD-10-CM | POA: Diagnosis not present

## 2022-06-19 DIAGNOSIS — M25612 Stiffness of left shoulder, not elsewhere classified: Secondary | ICD-10-CM

## 2022-06-19 NOTE — Therapy (Unsigned)
OUTPATIENT PHYSICAL THERAPY TREATMENT   Patient Name: Ariana Jefferson MRN: [id number] DOB:1960-05-03, 62 y.o., female Today's Date: 06/19/2022   PCP: Leim Fabry, MD REFERRING PROVIDER: Gunnar Fusi, PA  END OF SESSION:   PT End of Session - 06/19/22 1654     Visit Number 34    Number of Visits 38    Date for PT Re-Evaluation 07/06/22    Authorization Type [employer] 2023, 60 combined PT/OT/Chiro    Progress Note Due on Visit 38    PT Start Time 1634    PT Stop Time 1715    PT Time Calculation (min) 41 min    Activity Tolerance Patient tolerated treatment well;Patient limited by pain    Behavior During Therapy WFL for tasks assessed/performed                Past Medical History:  Diagnosis Date   High cholesterol    Past Surgical History:  Procedure Laterality Date   CESAREAN SECTION     CHOLECYSTECTOMY     KNEE SURGERY     LAPAROSCOPIC GASTRIC SLEEVE RESECTION     Patient Active Problem List   Diagnosis Date Noted   Hx of skin cancer, basal cell 02/07/2018   Obesity 02/07/2018   High cholesterol 12/11/2016   Low vitamin B12 level 12/11/2016   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   Lung nodule seen on imaging study 11/13/2014   DJD (degenerative joint disease) 05/14/2013    REFERRING DIAG:  W09.811 (ICD-10-CM) - S/P arthroscopy of left shoulder  Z01.818 (ICD-10-CM) - Encounter for other preprocedural examination    THERAPY DIAG:  Acute pain of left shoulder  Stiffness of left shoulder, not elsewhere classified  Muscle weakness (generalized)  Rationale for Evaluation and Treatment Rehabilitation  PERTINENT HISTORY: Pt is a 62 year old female s/p L shoulder arthroscopy with biceps tenodesis, resection of adhesions, SAD, DCE, debridement. Patient reports no post-op complications. Pt reports post-op pain is better than she anticipated; pt reports mild pain presently. Patient reports no instructions yet on exercises to do with shoulder. Pt has  weaned from opioid analgesic pain medicine; pt alternates Tylenol and Ibuprofen as needed. Pt denies recent physical trauma. Pt had emergency hemicholectomy earlier this year with good post-op recovery. Pt has been compliant with sling use as directed by her surgeon.       Patient Goals: Patient wants to return to golf, being able to remodel her home/paint, able to reach above head     PRECAUTIONS: No active biceps x 6 weeks, no resisted elbow flexion or supination, A/AAROM at 4 weeks post-op   DOS: 11/24/21   SUBJECTIVE:  SUBJECTIVE STATEMENT: Pt reports minimal soreness after last visit. No pain at arrival. Pt is compliant with HEP. She was cleaning camper to prepare for selling, and she reports notable fatigue/discomfort with this in L shoulder.    PAIN:  Are you having pain? No   OBJECTIVE: (objective measures completed at initial evaluation unless otherwise dated)   Patient Surveys  Eval: FOTO: 29, predicted score of 60   Posture Mild forward head, protracted scapulae   AROM                AROM (Normal range in degrees) AROM 12/13/2021 AROM 01/30/22 AROM 03/13/22 AROM 04/05/22 AROM 05/24/22    Right Left Right Left Right Left Right Left Right Left  Shoulder              Flexion   Deferred WNL 127 WFL 128  119 165 147  Extension   Deferred WNL 72 WFL 64      Abduction   Deferred WNL 133 WFL 121  132 178 147  External Rotation   Deferred WNL 40 WFL 84  80 (arm at side) 82 (arm at 90 deg abduction) 81 WNL (arm at 90 deg abduction) 83 (arm at 90 deg abduction)  Internal Rotation   Deferred WNL 68 WFL 70  WFL (arm at side) 38 (at 90 abduction) WNL (arm at 90 deg abduction) 70 (arm at 90 deg abduction)  Hands Behind Head   Deferred          Hands Behind Back   Deferred       T7 L2                  Elbow              Flexion              Extension              Pronation              Supination              (* = pain; Blank rows = not tested)     PROM             PROM (Normal range in degrees) PROM 12/13/2021 PROM 01/30/22 PROM 01/30/22 PROM 03/13/22 PROM 04/05/22 PROM 05/24/22    Right Left Right Left Left Left Left  Shoulder           Flexion   50 WNL 145 159 145 158  Extension           Abduction      110 130 110 140  External Rotation   15 WNL 50 62 64 72  Internal Rotation   45 WNL WNL WNL 67 WNL  Hands Behind Head           Hands Behind Back                       Elbow           Flexion   WNL  WNL     Extension   -10  0     Pronation   WNL  WNL     Supination   WNL  WNL     (* = pain; Blank rows = not tested)     LE MMT: MMT (out of 5) Right 12/13/2021 Left 12/13/2021 Right 01/30/22 Left 01/30/22 Left 04/05/22 Left 05/24/22  Shoulder       Flexion     5 4 4 4   Extension          Abduction     5 4 4 4   External rotation     5 4- 4 4+  Internal rotation     5 4+ 4* 4+  Horizontal abduction          Horizontal adduction          Lower Trapezius          Rhomboids                     Elbow      Flexion     5 4 4+ 5  Extension     5 4 4 5   Pronation          Supination                     Wrist      Flexion          Extension          Radial deviation          Ulnar deviation                     (* = pain; Blank rows = not tested)    Palpation   Location LEFT  RIGHT           Subocciptials      Cervical paraspinals      Upper Trapezius 0    Levator Scapulae      Rhomboid Major/Minor      Sternoclavicular joint 0    Acromioclavicular joint 2    Coracoid process 2    Bicipital groove 2    Supraspinatus 1    Infraspinatus 0    Subscapularis      Teres Minor      Teres Major      Pectoralis Major 1    Pectoralis Minor      Anterior Deltoid 1    Lateral Deltoid 1    Posterior Deltoid 0    Latissimus Dorsi       Sternocleidomastoid      (Blank rows = not tested) Graded on 0-4 scale (0 = no pain, 1 = pain, 2 = pain with wincing/grimacing/flinching, 3 = pain with withdrawal, 4 = unwilling to allow palpation), (Blank rows = not tested)            TODAY'S TREATMENT   06/19/2022   Manual Therapy - for R shoulder ROM and to prevent R shoulder stiffness   L shoulder PROM into flexion, abduction, ER and IR as tolerated by patient with gentle overpressure into end-ROM; x 5 minutes Gentle GHJ mobilizations at gr I-II for pain control, inferior and A-P for 3x30 sec each  Rhythmic stabilization with arm at 100 deg forward elevation; 2 x 1 minutes in supine  *not today* Gentle STM to L biceps, anterior and middle deltoid; x 1 minute L elbow PROM for flexion/extension and pronation/supination; x 1 minutes Passive upper trapezius stretching/scapular depression; 3x30 sec Glenohumeral mobilizations  at gr III for improved mobility, inferior and posterior; 1x30 sec each     Therapeutic Exercise - for shoulder complex ROM as needed for ability to perform reaching and self-care/ADLs, strengthening to improve capacity for loading biceps and RTC and as needed for improved ability to perform lifting  Upper body  ergometer, 2 minutes forward, 2 minutes backward - for tissue warm-up to improve muscle performance, improved soft tissue mobility/extensibility - subjective information gathered during portion of this time, 2 minutes not billed    Wall Y/lower trapezius lift-off with Red Tband; 2x10    Sidelying on R side, LUE functional internal rotation AROM, for gravity-assisted motion; 2x10     Seated lat pulldown with Nautilus; single long bar; 70-lbs; 2x12      Standing staggered stance, bar row; 50-lbs; 2x12   PATIENT EDUCATION: Discussed role of PT with remainder of visits with pt approaching end-phase of rehab   *next visit* Standing bodyblade, oscillations medial lateral with arm sustained at 90 deg  shoulder flexion; 2x30 sec each Tband resisted ER; Green Tband; 2x10 Functional IR with eccentric; Green Tband to improve functional IR AROM; x10    -functional IR improved from L3 to T12 Functional IR reach, passing Dbell behind back; x20 CW and CCW   *not today* Standing resisted shoulder flexion, elbows fully extended; 3-lb; 2x10 Wand functional IR (hand behind back); x10 Standing serratus slide with foam roll, with yellow Tband at wrists; 2x10 Tband resisted scaption, in standing; Red Tband; 2x8 Sidelying shoulder abduction AROM; 2x10 with surgical UE Wall slide, flexion and abduction; x10, 5 sec hold at top  Supine flexion AAROM with wand; 1x10, 5 sec hold, with 5-lb cuff weight for overhead mobility  Standing Functional IR (hand behind back) AAROM with strap; x12, 5 sec hold   Standing, wand ER AAROM, with elbow propped to 90 deg on handrail (of staircase) in center of gym; x12 5 sec hold Supine flexion AROM; 2x10 Sidelying abduction AROM; 1x10 Finger ladder; x5, 10 sec for flexion today Supine ER AAROM with wand; 2x10, 5 sec hold Shoulder isometrics; shoulder flexion, ABD, and extension; x15 each direction, 5 sec - heavy demonstration and verbal/tactile cueing for technique Wand flexion AAROM in supine; 2x10 Ball roll up wall, yellow physioball; x5 Swiss ball roll across table, shoulder abduction; x15, Green physioball  Pulley; flexion, 2 minutes AAROM Pendulums, forward/backward, side-to-side circles CW and CCW; x20 each with well arm propped on edge of treatment table Scapular retraction; reviewed Gripping; pink putty; 2 minutes Wrist AROM, flexion/extension and RD/UD; reviewed Cold pack (unbilled) - for anti-inflammatory and analgesic effect as needed for reduced pain and improved ability to participate in active PT intervention, along R shoulder with R elbow propped on pillow, pt seated;  x 5 minutes    PATIENT EDUCATION:  Education details: see above for patient  education details Person educated: Patient Education method: Explanation, Handout Education comprehension: verbalized understanding     HOME EXERCISE PROGRAM: Access Code: [email or web address] URL: [email or web address] Date: 04/05/2022 Prepared by: Consuela Mimes  Exercises - Supine Shoulder Flexion with Dowel  - 2 x daily - 7 x weekly - 2 sets - 10 reps - Supine Shoulder Flexion Extension Full Range AROM  - 2 x daily - 7 x weekly - 2 sets - 10 reps - Supine Shoulder External Rotation in 45 Degrees Abduction AAROM with Dowel  - 2 x daily - 7 x weekly - 2 sets - 10 reps - Standing Shoulder Internal Rotation Stretch with Towel  - 2 x daily - 7 x weekly - 2 sets - 10 reps - 5sec hold - Shoulder Flexion Wall Slide with Towel  - 2 x daily - 7 x weekly - 2 sets - 5-10 reps - Standing Shoulder Abduction Slides at Wall  - 2 x daily - 7  x weekly - 2 sets - 5-10 reps       ASSESSMENT:   CLINICAL IMPRESSION: Patient has intermittent discomfort with rhythmic stabilizations and inferior glide of GHJ (slightly lowered degree of abduction with improved tolerance). Pt demonstrates ongoing WFL passive motion; she has moderate deficit remaining in forward elevation and functional IR. Pt has remaining deficits in L shoulder strength (deltoid, RTC, periscapular mm), active and passive ROM, L shoulder stiffness, postural changes, and anterior shoulder/biceps pain intermittently with ROM work. Patient will benefit from continued skilled therapeutic intervention to address the above deficits as needed for improved function and QoL.     REHAB POTENTIAL: Excellent   CLINICAL DECISION MAKING: Evolving/moderate complexity   EVALUATION COMPLEXITY: Low     GOALS: Goals reviewed with patient? Yes   SHORT TERM GOALS: Target date: 01/10/2022   Pt will be independent with HEP to improve strength and decrease neck pain to improve pain-free function at home and work. Baseline: 12/12/21: Baseline HEP  initiated; 01/11/22: HEP recently updated 04/05/22: HEP updated today Goal status: Achieved;      LONG TERM GOALS: Target date: 02/07/2022   Pt will increase FOTO to at least 60 to demonstrate significant improvement in function at home and work related to neck pain  Baseline: 12/12/21: 29/60   01/11/22: 53/60    03/29/22: 56/60    05/24/22: 63/60 Goal status: Achieved;   2.   Pt will have L shoulder AROM at least within 10 degrees of contralateral upper extremity indicative of improved ROM as needed for reaching, overhead work, self-care activities/dressing/grooming Baseline: 12/12/21: NO AROM presently, severely limited PROM in early post-op phase.   01/30/22: Shoulder IR WNL; pt is limited in all other planes of motion at this time    03/13/22: Pt has primary remaining AROM limitations in flexion and abduction.     04/05/22: Pt has remaining AROM limitations in flexion, abduction, IR (at 90 abduction), and ER (at 90 abduction).   05/24/22: AROM deficits in flexion and abduction Goal status: ON-GOING   3. Pt will improve L shoulder strength to at least 4+/5 or greater for all motions measured as needed for improved capacity to perform lifting and stabilize shoulder during working with power tools to complete home improvement projects and swing golf club       Baseline: 12/12/21: No MMTs due to early post-op status   01/30/22: MMT 4- to 4+      03/13/22: Deferred.  04/05/22: Gross strength 4/5 (see chart above).   05/24/22: L shoulder 4 to 4+/5, biceps 5/5 Goal status: IN PROGRESS   4. Pt will demonstrate golf swing with sound technique and no reproduction of shoulder pain as needed for return to golfing Baseline: 12/12/21: Unable to complete shoulder AROM or compound motion required for golf swing   01/30/22: Deferred.   03/13/22: Deferred. 04/05/22: deferred.   05/24/22: Completed chipping motion with golf club without notable pain; pt primarily concerned with limited backswing AROM.  Goal status: IN  PROGRESS     PLAN: PT FREQUENCY: 1-2x/week   PT DURATION:  4-6 weeks   PLANNED INTERVENTIONS: Therapeutic exercises, Therapeutic activity, Neuromuscular re-education, Patient/Family education, Joint mobilization, Dry Needling, Electrical stimulation, Cryotherapy, Moist heat, Traction, and Manual therapy   PLAN FOR NEXT SESSION: Restoration of end-range flexion and abduction AROM, hand behind back motion (functional IR most limited); continue with RTC and long-lever strengthening progressively for shoulder flexors (starting with light to moderate loads). Taper PT visits as ROM gets closer to  symmetry with opposite upper limb and strength improves.     Consuela Mimes, PT, DPT [phone] 06/19/2022, 4:54 PM

## 2022-06-21 ENCOUNTER — Ambulatory Visit: Payer: Managed Care, Other (non HMO) | Admitting: Physical Therapy

## 2022-06-21 NOTE — Therapy (Deleted)
OUTPATIENT PHYSICAL THERAPY TREATMENT   Patient Name: Ariana Jefferson MRN: [id number] DOB:[birth year], 62 y.o., female Today's Date: 06/21/2022   PCP: Leim Fabry, MD REFERRING PROVIDER: Gunnar Fusi, PA  END OF SESSION:        Past Medical History:  Diagnosis Date   High cholesterol    Past Surgical History:  Procedure Laterality Date   CESAREAN SECTION     CHOLECYSTECTOMY     KNEE SURGERY     LAPAROSCOPIC GASTRIC SLEEVE RESECTION     Patient Active Problem List   Diagnosis Date Noted   Hx of skin cancer, basal cell 02/07/2018   Obesity 02/07/2018   High cholesterol 12/11/2016   Low vitamin B12 level 12/11/2016   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   Lung nodule seen on imaging study 11/13/2014   DJD (degenerative joint disease) 05/14/2013    REFERRING DIAG:  U27.253 (ICD-10-CM) - S/P arthroscopy of left shoulder  Z01.818 (ICD-10-CM) - Encounter for other preprocedural examination    THERAPY DIAG:  Acute pain of left shoulder  Stiffness of left shoulder, not elsewhere classified  Muscle weakness (generalized)  Rationale for Evaluation and Treatment Rehabilitation  PERTINENT HISTORY: Pt is a 62 year old female s/p L shoulder arthroscopy with biceps tenodesis, resection of adhesions, SAD, DCE, debridement. Patient reports no post-op complications. Pt reports post-op pain is better than she anticipated; pt reports mild pain presently. Patient reports no instructions yet on exercises to do with shoulder. Pt has weaned from opioid analgesic pain medicine; pt alternates Tylenol and Ibuprofen as needed. Pt denies recent physical trauma. Pt had emergency hemicholectomy earlier this year with good post-op recovery. Pt has been compliant with sling use as directed by her surgeon.       Patient Goals: Patient wants to return to golf, being able to remodel her home/paint, able to reach above head     PRECAUTIONS: No active biceps x 6 weeks, no resisted elbow  flexion or supination, A/AAROM at 4 weeks post-op   DOS: 11/24/21   SUBJECTIVE:                                                                                                                                                                                      SUBJECTIVE STATEMENT: Pt reports minimal soreness after last visit. No pain at arrival. Pt is compliant with HEP. She was cleaning camper to prepare for selling, and she reports notable fatigue/discomfort with this in L shoulder.    PAIN:  Are you having pain? No   OBJECTIVE: (objective measures completed at initial evaluation unless otherwise dated)   Patient Surveys  Eval: FOTO: 29, predicted score of 60  Posture Mild forward head, protracted scapulae   AROM                AROM (Normal range in degrees) AROM 12/13/2021 AROM 01/30/22 AROM 03/13/22 AROM 04/05/22 AROM 05/24/22    Right Left Right Left Right Left Right Left Right Left  Shoulder              Flexion   Deferred WNL 127 WFL 128  119 165 147  Extension   Deferred WNL 72 WFL 64      Abduction   Deferred WNL 133 WFL 121  132 178 147  External Rotation   Deferred WNL 40 WFL 84  80 (arm at side) 82 (arm at 90 deg abduction) 81 WNL (arm at 90 deg abduction) 83 (arm at 90 deg abduction)  Internal Rotation   Deferred WNL 68 WFL 70  WFL (arm at side) 38 (at 90 abduction) WNL (arm at 90 deg abduction) 70 (arm at 90 deg abduction)  Hands Behind Head   Deferred          Hands Behind Back   Deferred       T7 L2                 Elbow              Flexion              Extension              Pronation              Supination              (* = pain; Blank rows = not tested)     PROM             PROM (Normal range in degrees) PROM 12/13/2021 PROM 01/30/22 PROM 01/30/22 PROM 03/13/22 PROM 04/05/22 PROM 05/24/22    Right Left Right Left Left Left Left  Shoulder           Flexion   50 WNL 145 159 145 158  Extension           Abduction      110 130 110 140   External Rotation   15 WNL 50 62 64 72  Internal Rotation   45 WNL WNL WNL 67 WNL  Hands Behind Head           Hands Behind Back                       Elbow           Flexion   WNL  WNL     Extension   -10  0     Pronation   WNL  WNL     Supination   WNL  WNL     (* = pain; Blank rows = not tested)     LE MMT: MMT (out of 5) Right 12/13/2021 Left 12/13/2021 Right 01/30/22 Left 01/30/22 Left 04/05/22 Left 05/24/22             Shoulder       Flexion     5 4 4 4   Extension          Abduction     5 4 4 4   External rotation     5 4- 4 4+  Internal rotation     5 4+ 4* 4+  Horizontal abduction  Horizontal adduction          Lower Trapezius          Rhomboids                     Elbow      Flexion     5 4 4+ 5  Extension     5 4 4 5   Pronation          Supination                     Wrist      Flexion          Extension          Radial deviation          Ulnar deviation                     (* = pain; Blank rows = not tested)    Palpation   Location LEFT  RIGHT           Subocciptials      Cervical paraspinals      Upper Trapezius 0    Levator Scapulae      Rhomboid Major/Minor      Sternoclavicular joint 0    Acromioclavicular joint 2    Coracoid process 2    Bicipital groove 2    Supraspinatus 1    Infraspinatus 0    Subscapularis      Teres Minor      Teres Major      Pectoralis Major 1    Pectoralis Minor      Anterior Deltoid 1    Lateral Deltoid 1    Posterior Deltoid 0    Latissimus Dorsi      Sternocleidomastoid      (Blank rows = not tested) Graded on 0-4 scale (0 = no pain, 1 = pain, 2 = pain with wincing/grimacing/flinching, 3 = pain with withdrawal, 4 = unwilling to allow palpation), (Blank rows = not tested)            TODAY'S TREATMENT   06/21/2022   Manual Therapy - for R shoulder ROM and to prevent R shoulder stiffness   L shoulder PROM into flexion, abduction, ER and IR as tolerated by patient with gentle overpressure  into end-ROM; x 5 minutes Gentle GHJ mobilizations at gr I-II for pain control, inferior and A-P for 3x30 sec each  Rhythmic stabilization with arm at 100 deg forward elevation; 2 x 1 minutes in supine  *not today* Gentle STM to L biceps, anterior and middle deltoid; x 1 minute L elbow PROM for flexion/extension and pronation/supination; x 1 minutes Passive upper trapezius stretching/scapular depression; 3x30 sec Glenohumeral mobilizations  at gr III for improved mobility, inferior and posterior; 1x30 sec each     Therapeutic Exercise - for shoulder complex ROM as needed for ability to perform reaching and self-care/ADLs, strengthening to improve capacity for loading biceps and RTC and as needed for improved ability to perform lifting  Upper body ergometer, 2 minutes forward, 2 minutes backward - for tissue warm-up to improve muscle performance, improved soft tissue mobility/extensibility - subjective information gathered during portion of this time, 2 minutes not billed    Wall Y/lower trapezius lift-off with Red Tband; 2x10    Sidelying on R side, LUE functional internal rotation AROM, for gravity-assisted motion; 2x10     Seated lat pulldown with Nautilus; single long  bar; 70-lbs; 2x12      Standing staggered stance, bar row; 50-lbs; 2x12   PATIENT EDUCATION: Discussed role of PT with remainder of visits with pt approaching end-phase of rehab   *next visit* Standing bodyblade, oscillations medial lateral with arm sustained at 90 deg shoulder flexion; 2x30 sec each Tband resisted ER; Green Tband; 2x10 Functional IR with eccentric; Green Tband to improve functional IR AROM; x10    -functional IR improved from L3 to T12 Functional IR reach, passing Dbell behind back; x20 CW and CCW   *not today* Standing resisted shoulder flexion, elbows fully extended; 3-lb; 2x10 Wand functional IR (hand behind back); x10 Standing serratus slide with foam roll, with yellow Tband at wrists;  2x10 Tband resisted scaption, in standing; Red Tband; 2x8 Sidelying shoulder abduction AROM; 2x10 with surgical UE Wall slide, flexion and abduction; x10, 5 sec hold at top  Supine flexion AAROM with wand; 1x10, 5 sec hold, with 5-lb cuff weight for overhead mobility  Standing Functional IR (hand behind back) AAROM with strap; x12, 5 sec hold   Standing, wand ER AAROM, with elbow propped to 90 deg on handrail (of staircase) in center of gym; x12 5 sec hold Supine flexion AROM; 2x10 Sidelying abduction AROM; 1x10 Finger ladder; x5, 10 sec for flexion today Supine ER AAROM with wand; 2x10, 5 sec hold Shoulder isometrics; shoulder flexion, ABD, and extension; x15 each direction, 5 sec - heavy demonstration and verbal/tactile cueing for technique Wand flexion AAROM in supine; 2x10 Ball roll up wall, yellow physioball; x5 Swiss ball roll across table, shoulder abduction; x15, Green physioball  Pulley; flexion, 2 minutes AAROM Pendulums, forward/backward, side-to-side circles CW and CCW; x20 each with well arm propped on edge of treatment table Scapular retraction; reviewed Gripping; pink putty; 2 minutes Wrist AROM, flexion/extension and RD/UD; reviewed Cold pack (unbilled) - for anti-inflammatory and analgesic effect as needed for reduced pain and improved ability to participate in active PT intervention, along R shoulder with R elbow propped on pillow, pt seated;  x 5 minutes    PATIENT EDUCATION:  Education details: see above for patient education details Person educated: Patient Education method: Explanation, Handout Education comprehension: verbalized understanding     HOME EXERCISE PROGRAM: Access Code: [email or web address] URL: [email or web address] Date: 04/05/2022 Prepared by: Consuela Mimes  Exercises - Supine Shoulder Flexion with Dowel  - 2 x daily - 7 x weekly - 2 sets - 10 reps - Supine Shoulder Flexion Extension Full Range AROM  - 2 x daily - 7 x weekly - 2 sets -  10 reps - Supine Shoulder External Rotation in 45 Degrees Abduction AAROM with Dowel  - 2 x daily - 7 x weekly - 2 sets - 10 reps - Standing Shoulder Internal Rotation Stretch with Towel  - 2 x daily - 7 x weekly - 2 sets - 10 reps - 5sec hold - Shoulder Flexion Wall Slide with Towel  - 2 x daily - 7 x weekly - 2 sets - 5-10 reps - Standing Shoulder Abduction Slides at Wall  - 2 x daily - 7 x weekly - 2 sets - 5-10 reps       ASSESSMENT:   CLINICAL IMPRESSION: Patient has intermittent discomfort with rhythmic stabilizations and inferior glide of GHJ (slightly lowered degree of abduction with improved tolerance). Pt demonstrates ongoing WFL passive motion; she has moderate deficit remaining in forward elevation and functional IR. Pt has remaining deficits in L shoulder strength (deltoid, RTC, periscapular mm), active and  passive ROM, L shoulder stiffness, postural changes, and anterior shoulder/biceps pain intermittently with ROM work. Patient will benefit from continued skilled therapeutic intervention to address the above deficits as needed for improved function and QoL.     REHAB POTENTIAL: Excellent   CLINICAL DECISION MAKING: Evolving/moderate complexity   EVALUATION COMPLEXITY: Low     GOALS: Goals reviewed with patient? Yes   SHORT TERM GOALS: Target date: 01/10/2022   Pt will be independent with HEP to improve strength and decrease neck pain to improve pain-free function at home and work. Baseline: 12/12/21: Baseline HEP initiated; 01/11/22: HEP recently updated 04/05/22: HEP updated today Goal status: Achieved;      LONG TERM GOALS: Target date: 02/07/2022   Pt will increase FOTO to at least 60 to demonstrate significant improvement in function at home and work related to neck pain  Baseline: 12/12/21: 29/60   01/11/22: 53/60    03/29/22: 56/60    05/24/22: 63/60 Goal status: Achieved;   2.   Pt will have L shoulder AROM at least within 10 degrees of contralateral upper  extremity indicative of improved ROM as needed for reaching, overhead work, self-care activities/dressing/grooming Baseline: 12/12/21: NO AROM presently, severely limited PROM in early post-op phase.   01/30/22: Shoulder IR WNL; pt is limited in all other planes of motion at this time    03/13/22: Pt has primary remaining AROM limitations in flexion and abduction.     04/05/22: Pt has remaining AROM limitations in flexion, abduction, IR (at 90 abduction), and ER (at 90 abduction).   05/24/22: AROM deficits in flexion and abduction Goal status: ON-GOING   3. Pt will improve L shoulder strength to at least 4+/5 or greater for all motions measured as needed for improved capacity to perform lifting and stabilize shoulder during working with power tools to complete home improvement projects and swing golf club       Baseline: 12/12/21: No MMTs due to early post-op status   01/30/22: MMT 4- to 4+      03/13/22: Deferred.  04/05/22: Gross strength 4/5 (see chart above).   05/24/22: L shoulder 4 to 4+/5, biceps 5/5 Goal status: IN PROGRESS   4. Pt will demonstrate golf swing with sound technique and no reproduction of shoulder pain as needed for return to golfing Baseline: 12/12/21: Unable to complete shoulder AROM or compound motion required for golf swing   01/30/22: Deferred.   03/13/22: Deferred. 04/05/22: deferred.   05/24/22: Completed chipping motion with golf club without notable pain; pt primarily concerned with limited backswing AROM.  Goal status: IN PROGRESS     PLAN: PT FREQUENCY: 1-2x/week   PT DURATION:  4-6 weeks   PLANNED INTERVENTIONS: Therapeutic exercises, Therapeutic activity, Neuromuscular re-education, Patient/Family education, Joint mobilization, Dry Needling, Electrical stimulation, Cryotherapy, Moist heat, Traction, and Manual therapy   PLAN FOR NEXT SESSION: Restoration of end-range flexion and abduction AROM, hand behind back motion (functional IR most limited); continue with RTC and  long-lever strengthening progressively for shoulder flexors (starting with light to moderate loads). Taper PT visits as ROM gets closer to symmetry with opposite upper limb and strength improves.     Consuela Mimes, PT, DPT [phone] 06/21/2022, 1:27 PM

## 2022-07-19 ENCOUNTER — Encounter: Admit: 2022-07-19 | Payer: PRIVATE HEALTH INSURANCE | Attending: Internal Medicine | Primary: Internal Medicine

## 2022-07-20 MED ORDER — DEXMETHYLPHENIDATE 10 MG TABLET
10 | ORAL_TABLET | Freq: Two times a day (BID) | ORAL | 1 refills | 30.00000 days | Status: AC
Start: 2022-07-20 — End: 2022-08-16

## 2022-07-20 MED ORDER — DEXMETHYLPHENIDATE ER 20 MG CAPSULE,EXTENDED RELEASE BIPHASIC50-50
20 | ORAL_CAPSULE | ORAL | 1 refills | 30.00000 days | Status: AC
Start: 2022-07-20 — End: 2022-08-16

## 2022-08-04 ENCOUNTER — Encounter: Admit: 2022-08-04 | Payer: BLUE CROSS/BLUE SHIELD | Attending: Internal Medicine | Primary: Internal Medicine

## 2022-08-15 ENCOUNTER — Encounter: Admit: 2022-08-15 | Payer: BLUE CROSS/BLUE SHIELD | Attending: Internal Medicine | Primary: Internal Medicine

## 2022-08-16 ENCOUNTER — Encounter: Admit: 2022-08-16 | Payer: PRIVATE HEALTH INSURANCE | Attending: Internal Medicine | Primary: Internal Medicine

## 2022-08-16 MED ORDER — DEXMETHYLPHENIDATE 10 MG TABLET
10 | ORAL_TABLET | Freq: Two times a day (BID) | ORAL | 1 refills | 30.00000 days | Status: AC
Start: 2022-08-16 — End: 2022-09-11

## 2022-08-16 MED ORDER — DEXMETHYLPHENIDATE ER 20 MG CAPSULE,EXTENDED RELEASE BIPHASIC50-50
20 | ORAL_CAPSULE | ORAL | 1 refills | 30.00000 days | Status: AC
Start: 2022-08-16 — End: 2022-09-11

## 2022-09-05 ENCOUNTER — Encounter: Admit: 2022-09-05 | Payer: PRIVATE HEALTH INSURANCE | Attending: Internal Medicine | Primary: Internal Medicine

## 2022-09-05 MED ORDER — METOPROLOL SUCCINATE ER 25 MG TABLET,EXTENDED RELEASE 24 HR
25 | ORAL_TABLET | ORAL | 5 refills | 90.00000 days | Status: AC
Start: 2022-09-05 — End: 2022-11-10

## 2022-09-11 ENCOUNTER — Encounter: Admit: 2022-09-11 | Payer: PRIVATE HEALTH INSURANCE | Attending: Internal Medicine | Primary: Internal Medicine

## 2022-09-11 MED ORDER — DEXMETHYLPHENIDATE 10 MG TABLET
10 | ORAL_TABLET | Freq: Two times a day (BID) | ORAL | 1 refills | 30.00000 days | Status: AC
Start: 2022-09-11 — End: 2022-10-18

## 2022-09-11 MED ORDER — DEXMETHYLPHENIDATE ER 20 MG CAPSULE,EXTENDED RELEASE BIPHASIC50-50
20 | ORAL_CAPSULE | ORAL | 1 refills | 30.00000 days | Status: AC
Start: 2022-09-11 — End: 2022-10-18

## 2022-09-15 ENCOUNTER — Encounter: Admit: 2022-09-15 | Payer: PRIVATE HEALTH INSURANCE | Attending: Internal Medicine | Primary: Internal Medicine

## 2022-09-18 ENCOUNTER — Encounter: Admit: 2022-09-18 | Payer: PRIVATE HEALTH INSURANCE | Primary: Internal Medicine

## 2022-10-17 ENCOUNTER — Encounter: Admit: 2022-10-17 | Payer: PRIVATE HEALTH INSURANCE | Attending: Internal Medicine | Primary: Internal Medicine

## 2022-10-17 MED ORDER — DEXMETHYLPHENIDATE 10 MG TABLET
10 | ORAL_TABLET | Freq: Two times a day (BID) | ORAL | 1 refills | 30.00000 days | Status: AC
Start: 2022-10-17 — End: 2022-11-27

## 2022-10-17 MED ORDER — DEXMETHYLPHENIDATE ER 20 MG CAPSULE,EXTENDED RELEASE BIPHASIC50-50
20 | ORAL_CAPSULE | ORAL | 1 refills | 30.00000 days | Status: AC
Start: 2022-10-17 — End: 2022-11-27

## 2022-10-27 ENCOUNTER — Telehealth: Admit: 2022-10-27 | Payer: PRIVATE HEALTH INSURANCE | Attending: Internal Medicine | Primary: Internal Medicine

## 2022-10-31 ENCOUNTER — Encounter: Admit: 2022-10-31 | Payer: PRIVATE HEALTH INSURANCE | Attending: Internal Medicine | Primary: Internal Medicine

## 2022-10-31 MED ORDER — HYDROCHLOROTHIAZIDE 12.5 MG TABLET
12.5 | ORAL_TABLET | ORAL | 12 refills | 90.00000 days | Status: AC
Start: 2022-10-31 — End: 2022-11-24

## 2022-11-02 ENCOUNTER — Encounter: Admit: 2022-11-02 | Payer: PRIVATE HEALTH INSURANCE | Attending: Internal Medicine | Primary: Internal Medicine

## 2022-11-02 ENCOUNTER — Inpatient Hospital Stay: Admit: 2022-11-02 | Discharge: 2022-11-02 | Payer: PRIVATE HEALTH INSURANCE | Primary: Internal Medicine

## 2022-11-02 DIAGNOSIS — R600 Localized edema: Secondary | ICD-10-CM

## 2022-11-02 DIAGNOSIS — I75023 Atheroembolism of bilateral lower extremities: Secondary | ICD-10-CM

## 2022-11-02 MED ORDER — DULOXETINE 60 MG CAPSULE,DELAYED RELEASE
60 | ORAL_CAPSULE | ORAL | 5 refills | 45.00000 days | Status: AC
Start: 2022-11-02 — End: ?

## 2022-11-03 ENCOUNTER — Encounter: Admit: 2022-11-03 | Payer: PRIVATE HEALTH INSURANCE | Attending: Internal Medicine | Primary: Internal Medicine

## 2022-11-06 ENCOUNTER — Telehealth: Admit: 2022-11-06 | Payer: PRIVATE HEALTH INSURANCE | Attending: Internal Medicine | Primary: Internal Medicine

## 2022-11-09 ENCOUNTER — Encounter
Admit: 2022-11-09 | Payer: PRIVATE HEALTH INSURANCE | Attending: Vascular and Interventional Radiology | Primary: Internal Medicine

## 2022-11-10 ENCOUNTER — Inpatient Hospital Stay: Admit: 2022-11-10 | Discharge: 2022-11-10 | Payer: PRIVATE HEALTH INSURANCE | Primary: Internal Medicine

## 2022-11-10 ENCOUNTER — Encounter: Admit: 2022-11-10 | Payer: PRIVATE HEALTH INSURANCE | Attending: Internal Medicine | Primary: Internal Medicine

## 2022-11-10 DIAGNOSIS — R002 Palpitations: Secondary | ICD-10-CM

## 2022-11-10 DIAGNOSIS — R0602 Shortness of breath: Secondary | ICD-10-CM

## 2022-11-10 DIAGNOSIS — R600 Localized edema: Secondary | ICD-10-CM

## 2022-11-10 MED ORDER — POTASSIUM CHLORIDE ER 10 MEQ TABLET,EXTENDED RELEASE
10 | ORAL_TABLET | Freq: Every day | ORAL | 12 refills | 90.00000 days | Status: AC
Start: 2022-11-10 — End: 2022-11-29

## 2022-11-10 MED ORDER — METOPROLOL SUCCINATE ER 50 MG TABLET,EXTENDED RELEASE 24 HR
50 | ORAL_TABLET | Freq: Every day | ORAL | 4 refills | 90.00000 days | Status: AC
Start: 2022-11-10 — End: 2022-11-27

## 2022-11-10 MED ORDER — FUROSEMIDE 20 MG TABLET
20 | ORAL_TABLET | ORAL | 12 refills | 70.00000 days | Status: AC
Start: 2022-11-10 — End: 2022-11-27

## 2022-11-11 ENCOUNTER — Encounter: Admit: 2022-11-11 | Payer: PRIVATE HEALTH INSURANCE | Attending: Internal Medicine | Primary: Internal Medicine

## 2022-11-24 ENCOUNTER — Encounter: Admit: 2022-11-24 | Payer: PRIVATE HEALTH INSURANCE | Attending: Internal Medicine | Primary: Internal Medicine

## 2022-11-24 MED ORDER — APIXABAN 5 MG TABLET
5 | ORAL_TABLET | Freq: Two times a day (BID) | ORAL | 12 refills | Status: SS
Start: 2022-11-24 — End: 2022-11-29

## 2022-11-24 MED ORDER — DILTIAZEM CD 120 MG CAPSULE,EXTENDED RELEASE 24 HR
120 | ORAL_CAPSULE | Freq: Every day | ORAL | 12 refills | 90.00000 days | Status: AC
Start: 2022-11-24 — End: 2022-11-29

## 2022-11-26 ENCOUNTER — Emergency Department: Admit: 2022-11-26 | Payer: PRIVATE HEALTH INSURANCE | Primary: Internal Medicine

## 2022-11-26 ENCOUNTER — Emergency Department: Admit: 2022-11-26 | Payer: PRIVATE HEALTH INSURANCE | Attending: Diagnostic Radiology | Primary: Internal Medicine

## 2022-11-26 ENCOUNTER — Inpatient Hospital Stay
Admit: 2022-11-26 | Discharge: 2022-11-29 | Payer: PRIVATE HEALTH INSURANCE | Attending: Internal Medicine | Admitting: Internal Medicine

## 2022-11-26 ENCOUNTER — Telehealth: Admit: 2022-11-26 | Payer: PRIVATE HEALTH INSURANCE | Attending: Internal Medicine | Primary: Internal Medicine

## 2022-11-26 DIAGNOSIS — E0590 Thyrotoxicosis, unspecified without thyrotoxic crisis or storm: Secondary | ICD-10-CM

## 2022-11-26 LAB — BASIC METABOLIC PANEL
BKR ANION GAP: 11 (ref 7–17)
BKR BLOOD UREA NITROGEN: 14 mg/dL (ref 6–20)
BKR BUN / CREAT RATIO: 22.2 (ref 8.0–23.0)
BKR CALCIUM: 9.5 mg/dL (ref 8.8–10.2)
BKR CHLORIDE: 104 mmol/L (ref 98–107)
BKR CO2: 24 mmol/L (ref 20–30)
BKR CREATININE: 0.63 mg/dL (ref 0.40–1.30)
BKR EGFR, CREATININE (CKD-EPI 2021): >60 mL/min/{1.73_m2} (ref >=60)
BKR GLUCOSE: 107 mg/dL — ABNORMAL HIGH (ref 70–100)
BKR POTASSIUM: 4.2 mmol/L (ref 3.3–5.3)
BKR SODIUM: 139 mmol/L (ref 136–144)

## 2022-11-26 LAB — CBC WITH AUTO DIFFERENTIAL
BKR WAM ABSOLUTE IMMATURE GRANULOCYTES.: 0.01 x 1000/ÂµL (ref 0.00–0.30)
BKR WAM ABSOLUTE LYMPHOCYTE COUNT.: 1.73 x 1000/ÂµL (ref 0.60–3.70)
BKR WAM ABSOLUTE NRBC (2 DEC): 0 x 1000/ÂµL (ref 0.00–1.00)
BKR WAM ANC (ABSOLUTE NEUTROPHIL COUNT): 1.4 x 1000/ÂµL — ABNORMAL LOW (ref 2.00–7.60)
BKR WAM BASOPHIL ABSOLUTE COUNT.: 0.02 x 1000/ÂµL (ref 0.00–1.00)
BKR WAM BASOPHILS: 0.5 % (ref 0.0–1.4)
BKR WAM EOSINOPHIL ABSOLUTE COUNT.: 0.11 x 1000/ÂµL (ref 0.00–1.00)
BKR WAM EOSINOPHILS: 3 % (ref 0.0–5.0)
BKR WAM HEMATOCRIT (2 DEC): 41 % (ref 35.00–45.00)
BKR WAM HEMOGLOBIN: 14.1 g/dL (ref 11.7–15.5)
BKR WAM IMMATURE GRANULOCYTES: 0.3 % (ref 0.0–1.0)
BKR WAM LYMPHOCYTES: 46.8 % (ref 17.0–50.0)
BKR WAM MCH (PG): 29.7 pg (ref 27.0–33.0)
BKR WAM MCHC: 34.4 g/dL (ref 31.0–36.0)
BKR WAM MCV: 86.5 fL (ref 80.0–100.0)
BKR WAM MONOCYTE ABSOLUTE COUNT.: 0.43 x 1000/ÂµL (ref 0.00–1.00)
BKR WAM MONOCYTES: 11.6 % (ref 4.0–12.0)
BKR WAM MPV: 10.3 fL (ref 8.0–12.0)
BKR WAM NEUTROPHILS: 37.8 % — ABNORMAL LOW (ref 39.0–72.0)
BKR WAM NUCLEATED RED BLOOD CELLS: 0 % (ref 0.0–1.0)
BKR WAM PLATELETS: 210 x1000/ÂµL (ref 150–420)
BKR WAM RDW-CV: 11.4 % (ref 11.0–15.0)
BKR WAM RED BLOOD CELL COUNT.: 4.74 M/ÂµL (ref 4.00–6.00)
BKR WAM WHITE BLOOD CELL COUNT: 3.7 x1000/ÂµL — ABNORMAL LOW (ref 4.0–11.0)

## 2022-11-26 LAB — HEPATIC FUNCTION PANEL
BKR A/G RATIO: 1.3 (ref 1.0–2.2)
BKR ALANINE AMINOTRANSFERASE (ALT): 24 U/L (ref 10–35)
BKR ALBUMIN: 3.6 g/dL (ref 3.6–5.1)
BKR ALKALINE PHOSPHATASE: 111 U/L (ref 9–122)
BKR ASPARTATE AMINOTRANSFERASE (AST): 33 U/L (ref 10–35)
BKR AST/ALT RATIO: 1.4
BKR BILIRUBIN DIRECT: 0.2 mg/dL (ref <=0.3)
BKR BILIRUBIN TOTAL: 0.5 mg/dL (ref <=1.2)
BKR GLOBULIN: 2.7 g/dL (ref 2.0–3.9)
BKR PROTEIN TOTAL: 6.3 g/dL (ref 5.9–8.3)

## 2022-11-26 LAB — TROPONIN T HIGH SENSITIVITY, 1 HOUR WITH REFLEX (BH GH LMW YH)
BKR TROPONIN T HS 1 HOUR DELTA FROM 0 HOUR: -19 ng/L
BKR TROPONIN T HS 1 HOUR: 12 ng/L — ABNORMAL HIGH

## 2022-11-26 LAB — T3: BKR T3 TOTAL: 376 ng/dL — ABNORMAL HIGH

## 2022-11-26 LAB — TROPONIN T HIGH SENSITIVITY, 0 HOUR BASELINE WITH REFLEX (BH GH LMW YH): BKR TROPONIN T HS 0 HOUR BASELINE: 31 ng/L — ABNORMAL HIGH

## 2022-11-26 LAB — PHOSPHORUS     (BH GH L LMW YH): BKR PHOSPHORUS: 4.4 mg/dL (ref 2.2–4.5)

## 2022-11-26 LAB — LIPASE: BKR LIPASE: 36 U/L (ref 11–55)

## 2022-11-26 LAB — TROPONIN T HIGH SENSITIVITY, 3 HOUR (BH GH LMW YH)
BKR TROPONIN T HS 1 HOUR DELTA FROM 0 HOUR ON 3HR: -19 ng/L
BKR TROPONIN T HS 3 HOUR DELTA FROM 0 HOUR: -17 ng/L
BKR TROPONIN T HS 3 HOUR: 14 ng/L — ABNORMAL HIGH

## 2022-11-26 LAB — MAGNESIUM: BKR MAGNESIUM: 1.8 mg/dL (ref 1.7–2.4)

## 2022-11-26 LAB — D-DIMER, QUANTITATIVE: BKR D-DIMER: 1.42 mg{FEU}/L — ABNORMAL HIGH (ref <=0.59)

## 2022-11-26 LAB — SEDIMENTATION RATE (ESR): BKR SEDIMENTATION RATE, ERYTHROCYTE: 8 mm/h (ref 0–20)

## 2022-11-26 LAB — THYROID PEROXIDASE ANTIBODY: BKR THYROID PEROXIDASE ANTIBODIES: 86 [IU]/mL — ABNORMAL HIGH (ref <34)

## 2022-11-26 MED ORDER — PROPRANOLOL IMMEDIATE RELEASE 20 MG TABLET
20 | Freq: Two times a day (BID) | ORAL | Status: DC
Start: 2022-11-26 — End: 2022-11-27
  Administered 2022-11-27: 13:00:00 20 mg via ORAL

## 2022-11-26 MED ORDER — SODIUM CHLORIDE 0.9 % BOLUS (NEW BAG)
0.9 | Freq: Once | INTRAVENOUS | Status: CP
Start: 2022-11-26 — End: ?
  Administered 2022-11-26: 19:00:00 0.9 mL/h via INTRAVENOUS

## 2022-11-26 MED ORDER — PROPRANOLOL IMMEDIATE RELEASE 20 MG TABLET
20 | Freq: Once | ORAL | Status: CP
Start: 2022-11-26 — End: ?
  Administered 2022-11-26: 19:00:00 20 mg via ORAL

## 2022-11-26 MED ORDER — PROPRANOLOL IMMEDIATE RELEASE 20 MG TABLET
20 | Freq: Once | ORAL | Status: CP
Start: 2022-11-26 — End: ?
  Administered 2022-11-26: 23:00:00 20 mg via ORAL

## 2022-11-26 MED ORDER — SODIUM CHLORIDE 0.9 % LARGE VOLUME SYRINGE FOR AUTOINJECTOR
Freq: Once | INTRAVENOUS | Status: CP | PRN
Start: 2022-11-26 — End: ?
  Administered 2022-11-26: 20:00:00 via INTRAVENOUS

## 2022-11-26 MED ORDER — SODIUM CHLORIDE 0.9 % (FLUSH) INJECTION SYRINGE
0.9 % | INTRAVENOUS | Status: AC | PRN
Start: 2022-11-26 — End: 2022-11-30
  Administered 2022-11-27: 09:00:00 0.9 mL via INTRAVENOUS

## 2022-11-26 MED ORDER — METOPROLOL TARTRATE IMMEDIATE RELEASE 25 MG TABLET
25 | Freq: Four times a day (QID) | ORAL | Status: DC
Start: 2022-11-26 — End: 2022-11-26

## 2022-11-26 MED ORDER — APIXABAN 5 MG TABLET
5 mg | Freq: Two times a day (BID) | ORAL | Status: CP
Start: 2022-11-26 — End: 2022-11-29
  Administered 2022-11-27 (×2): 5 mg via ORAL
  Administered 2022-11-27: 2.5 mg via ORAL
  Administered 2022-11-28 – 2022-11-29 (×4): 5 mg via ORAL

## 2022-11-26 MED ORDER — SODIUM CHLORIDE 0.9 % (FLUSH) INJECTION SYRINGE
0.9 % | Freq: Three times a day (TID) | INTRAVENOUS | Status: AC
Start: 2022-11-26 — End: 2022-11-30
  Administered 2022-11-27 – 2022-11-29 (×7): 0.9 mL via INTRAVENOUS

## 2022-11-26 MED ORDER — PROPRANOLOL IMMEDIATE RELEASE 20 MG TABLET
20 | Freq: Once | ORAL | Status: DC
Start: 2022-11-26 — End: 2022-11-26

## 2022-11-26 MED ORDER — PERFLUTREN LIPID MICROSPHERES 1.3 ML/10 ML NS INJ (IN
Freq: Once | INTRAVENOUS | Status: CP | PRN
Start: 2022-11-26 — End: ?
  Administered 2022-11-26: 23:00:00 10.000 mL via INTRAVENOUS

## 2022-11-26 MED ORDER — METHIMAZOLE 5 MG TABLET
5 mg | Freq: Every day | ORAL | Status: DC
Start: 2022-11-26 — End: 2022-11-27
  Administered 2022-11-26 – 2022-11-27 (×2): 5 mg via ORAL

## 2022-11-26 MED ORDER — IOHEXOL 350 MG IODINE/ML INTRAVENOUS SOLUTION
350 | Freq: Once | INTRAVENOUS | Status: CP | PRN
Start: 2022-11-26 — End: ?
  Administered 2022-11-26: 20:00:00 350 mL via INTRAVENOUS

## 2022-11-26 NOTE — ED Notes
 2:24 PM Kristie Frye is a 62 y.o. female who presents to the ED with CC of Tachycardia (Recent dx of a fib. Apple watch showed irregular tachycardia. Reports SOB/DOE. BLLE edema. HR 90-130s in triage. ) Patient states SOB on exertion and tachycardia. Pt denies any CP HR 90-140's. Denies nausea, vomiting, diarrhea, fever, chills, lightheadedness, dizziness or diaphoresis. Pt is A&O x 4. States she was taken off metoprolol and was supposed to start diltiazem but hasn't yet. Family at bedside. Cardiac monitor intact. Past Medical History: Diagnosis Date  ADHD (attention deficit hyperactivity disorder)   Arrhythmia   occasional palpitation - distant past cleared by ekg  Dysthymia   GERD (gastroesophageal reflux disease)   Heart palpitations   Hypertension   Pneumonia   Reflux   Syncope   distant past - 25+ years ago 3:30 PMPt is OTF at X[facility].4:38 PMPt returned from [place]. MD at bedside. 5:15 PMMD at bedside. 5:30 PMPt is having a bedside echo. RN is unable to access IV at this time. Pt is pending repeat trop. 7:05 PMStep down at bedside evaluating pt. Care transferred to oncoming RN . Verbal report given.

## 2022-11-26 NOTE — ED Notes
 1:51 PM Kristie Frye is a 62 y.o. female who presents to the ED with CC of Tachycardia (Recent dx of a fib. Apple watch showed irregular tachycardia. Reports SOB/DOE. BLLE edema. HR 90-130s in triage. ) Patient statesPast Medical History: Diagnosis Date  ADHD (attention deficit hyperactivity disorder)   Arrhythmia   occasional palpitation - distant past cleared by ekg  Dysthymia   GERD (gastroesophageal reflux disease)   Heart palpitations   Hypertension   Pneumonia   Reflux   Syncope   distant past - 25+ years ago

## 2022-11-27 ENCOUNTER — Inpatient Hospital Stay: Admit: 2022-11-27 | Payer: PRIVATE HEALTH INSURANCE

## 2022-11-27 DIAGNOSIS — E0590 Thyrotoxicosis, unspecified without thyrotoxic crisis or storm: Secondary | ICD-10-CM

## 2022-11-27 LAB — BASIC METABOLIC PANEL
BKR ANION GAP: 11 (ref 7–17)
BKR BLOOD UREA NITROGEN: 15 mg/dL (ref 6–20)
BKR BUN / CREAT RATIO: 23.8 — ABNORMAL HIGH (ref 8.0–23.0)
BKR CALCIUM: 9.1 mg/dL (ref 8.8–10.2)
BKR CHLORIDE: 106 mmol/L (ref 98–107)
BKR CO2: 24 mmol/L (ref 20–30)
BKR CREATININE: 0.63 mg/dL (ref 0.40–1.30)
BKR EGFR, CREATININE (CKD-EPI 2021): >60 mL/min/{1.73_m2} (ref >=60)
BKR GLUCOSE: 134 mg/dL — ABNORMAL HIGH (ref 70–100)
BKR POTASSIUM: 4.2 mmol/L (ref 3.3–5.3)
BKR SODIUM: 141 mmol/L (ref 136–144)

## 2022-11-27 LAB — CBC WITH AUTO DIFFERENTIAL
BKR WAM ABSOLUTE IMMATURE GRANULOCYTES.: 0.01 x 1000/ÂµL (ref 0.00–0.30)
BKR WAM ABSOLUTE LYMPHOCYTE COUNT.: 2.83 x 1000/ÂµL (ref 0.60–3.70)
BKR WAM ABSOLUTE NRBC (2 DEC): 0 x 1000/ÂµL (ref 0.00–1.00)
BKR WAM ANC (ABSOLUTE NEUTROPHIL COUNT): 1.48 x 1000/ÂµL — ABNORMAL LOW (ref 2.00–7.60)
BKR WAM BASOPHIL ABSOLUTE COUNT.: 0.02 x 1000/ÂµL (ref 0.00–1.00)
BKR WAM BASOPHILS: 0.4 % (ref 0.0–1.4)
BKR WAM EOSINOPHIL ABSOLUTE COUNT.: 0.2 x 1000/ÂµL (ref 0.00–1.00)
BKR WAM EOSINOPHILS: 3.9 % (ref 0.0–5.0)
BKR WAM HEMATOCRIT (2 DEC): 38.5 % (ref 35.00–45.00)
BKR WAM HEMOGLOBIN: 12.7 g/dL (ref 11.7–15.5)
BKR WAM IMMATURE GRANULOCYTES: 0.2 % (ref 0.0–1.0)
BKR WAM LYMPHOCYTES: 55.7 % — ABNORMAL HIGH (ref 17.0–50.0)
BKR WAM MCH (PG): 28.9 pg (ref 27.0–33.0)
BKR WAM MCHC: 33 g/dL (ref 31.0–36.0)
BKR WAM MCV: 87.5 fL (ref 80.0–100.0)
BKR WAM MONOCYTE ABSOLUTE COUNT.: 0.54 x 1000/ÂµL (ref 0.00–1.00)
BKR WAM MONOCYTES: 10.6 % (ref 4.0–12.0)
BKR WAM MPV: 10.8 fL (ref 8.0–12.0)
BKR WAM NEUTROPHILS: 29.2 % — ABNORMAL LOW (ref 39.0–72.0)
BKR WAM NUCLEATED RED BLOOD CELLS: 0 % (ref 0.0–1.0)
BKR WAM PLATELETS: 191 x1000/ÂµL (ref 150–420)
BKR WAM RDW-CV: 11.8 % (ref 11.0–15.0)
BKR WAM RED BLOOD CELL COUNT.: 4.4 M/ÂµL (ref 4.00–6.00)
BKR WAM WHITE BLOOD CELL COUNT: 5.1 x1000/ÂµL (ref 4.0–11.0)

## 2022-11-27 LAB — CK     (BH GH L LMW YH): BKR CREATINE KINASE TOTAL: 48 U/L (ref 11–204)

## 2022-11-27 MED ORDER — POTASSIUM CHLORIDE ER 20 MEQ TABLET,EXTENDED RELEASE(PART/CRYST)
20 MEQ | Freq: Every day | ORAL | Status: DC
Start: 2022-11-27 — End: 2022-11-27
  Administered 2022-11-27: 13:00:00 20 MEQ via ORAL

## 2022-11-27 MED ORDER — DULOXETINE 60 MG CAPSULE,DELAYED RELEASE
60 mg | ORAL | Status: CP
Start: 2022-11-27 — End: 2022-11-29
  Administered 2022-11-27 – 2022-11-29 (×3): 60 mg via ORAL

## 2022-11-27 MED ORDER — METHIMAZOLE 5 MG TABLET
5 mg | Freq: Every day | ORAL | Status: CP
Start: 2022-11-27 — End: 2022-11-29
  Administered 2022-11-28 – 2022-11-29 (×2): 5 mg via ORAL

## 2022-11-27 MED ORDER — FLUOCINOLONE ACETONIDE OIL 0.01 % EAR DROPS
0.01 % | Freq: Every day | OTIC | Status: SS | PRN
Start: 2022-11-27 — End: ?

## 2022-11-27 MED ORDER — CHOLECALCIFEROL (VITAMIN D3) 25 MCG (1,000 UNIT) TABLET
25 mcg (1,000 unit) | Freq: Every day | ORAL | Status: CP
Start: 2022-11-27 — End: 2022-11-30
  Administered 2022-11-27 – 2022-11-29 (×3): 25 mcg (1,000 unit) via ORAL

## 2022-11-27 MED ORDER — PROPRANOLOL IMMEDIATE RELEASE 20 MG TABLET
20 mg | Freq: Three times a day (TID) | ORAL | Status: CP
Start: 2022-11-27 — End: 2022-11-29
  Administered 2022-11-27 – 2022-11-29 (×5): 20 mg via ORAL

## 2022-11-27 NOTE — ED Notes
 8:02 AM Pt received in report from night shift RN. Pt observed to be resting quietly upright in stretcher. Pt is awake alert and oriented. Breathing is regular and unlabored at rest on room air. Pt denies any chest pain, palpitations or sob. No acute distress noted. Bedside monitor in place. +tachycardia, afib noted on monitor lead II. Pending IP bed assignment. Call bell left within reach. Chief Complaint Patient presents with  Tachycardia   Recent dx of a fib. Apple watch showed irregular tachycardia. Reports SOB/DOE. BLLE edema. HR 90-130s in triage.  Past Medical History: Diagnosis Date  ADHD (attention deficit hyperactivity disorder)   Arrhythmia   occasional palpitation - distant past cleared by ekg  Dysthymia   GERD (gastroesophageal reflux disease)   Heart palpitations   Hypertension   Pneumonia   Reflux   Syncope   distant past - 25+ years ago Vitals:  11/27/22 0801 BP: 124/80 Pulse: (!) 134 Resp: 15 Temp:  9:53 AM Pt to [place] department at this time.

## 2022-11-27 NOTE — ED Notes
 7:21 AM Verbal report given to on-coming RN. PT care transferred at this time.

## 2022-11-27 NOTE — Consults
  [facility]	 [facility]	Endocrine Consult Note Consult Information: Endocrine Consultation requested by: Donald Siva, MDReason for consultation: ThyrotoxicosisSource of Information: Patient and EMR/Previous RecordImpression: Kristie Frye is a 62 y.o. 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 Seen by Endocrinology Consult Service for thyrotoxicosis. Patient with signs of thyrotoxicosis including Afib, tremor, weight loss. TSH suppressed with elevated T4 and T3 levels, neutropenia, and positive TPO antibodies, putting Graves' high in the differential, although no ophthalmopathy or significant goiter on exam. ESR low, no recent URI, thyroiditis less likely. No palpable nodules suggestive of toxic nodule or MNG. Pending TSI, TRAB to confirm Dx, would start treatment with methimazole given presentation with Afib. Propranolol also to be started for HR control and symptomatic management in the interim. Thyroid US with Doppler can also provide diagnostic info, unable to get RAI uptake and scan given contrast exposure for CTA.Recommendations: Inpatient Recommendations:Follow up TSI. TRABThyroid ultrasound with DopplerAgree with propranolol 60 mg BID. Titrate with goal HR<110.Start methimazole 20 mg daily. Side effects reviewed with patient.Monitor CBC, LFTWill arrange follow [facility] at [facility] Endocrine clinicPlease contact us with any additional questions.. Presentation History: Kristie Frye is a 62 y.o. female with Hx of HTN, HLD, obesity, GERD, anxiety/depression, and recent Dx of Afib who presents with tachycardia. HR at 130s on presentation, SBP 120s-130s, EKG showed Afib.TFT from 10/4 after PCP visit showed TSH 0.02 and fT4 2.9.Per chart, she had irregular rhythm since September reported at her PCP visit.Today patient mentions palpitations for 3 weeks. She had palpitations in the past occasionally but were now more persistent. She also had worsening LE edema x 3 weeks, tremor all over her body, weight loss of 20 lb within the past 3 weeks. She denies diarrhea, SOB. She has occasional hot flushes. She takes a multivitamin which appears to contain biotin. She has blurry vision for months. Denies recent URI, neck pain. Denies prior personal and family Hx of thyroid disease.PMHx/Medications/Allergies: PMHx:  has a past medical history of ADHD (attention deficit hyperactivity disorder), Arrhythmia, Dysthymia, GERD (gastroesophageal reflux disease), Heart palpitations, Hypertension, Pneumonia, Reflux, and Syncope.PSurgHx:  has a past surgical history that includes Ablation saphenous vein w/ RFA; Urethral sling; and Laparoscopic colon resection.Family Hx: family history includes Breast cancer in her paternal aunt; Coronary Artery Disease in her mother; Diabetes in her brother, brother, father, and mother; Heart disease in her mother; Hypertension in her brother and brother; Obesity in her brother and brother; Pulmonary embolism in her mother; Sleep apnea in her brother and brother; Thrombophilia in her mother. Medications: (Not in a hospital admission)Allergies: No Known AllergiesSocial: Social History  reports that she has never smoked. She has never used smokeless tobacco. She reports that she does not currently use alcohol. She reports that she does not use drugs.Inpatient Medications:I have reviewed the patient's current medication orders.Endocrine Medications: -Review of Systems: 12 point ROS completed and negative except as mentioned above. Endocrine Physical Exam: Vitals:Vitals:  11/26/22 1333 11/26/22 1410 11/26/22 1414 11/26/22 1529 BP:  124/72   Pulse: (!) 131  (!) 143 (!) 128 Resp:   (!) 27 20 Temp:     TempSrc:     SpO2:  94% 96% 96% I/O's:No intake/output data recorded.No intake/output data recorded.No intake or output data in the 24 hours ending 11/26/22 1549Physical ExamConstitutional:     General: She is not in acute distress.   Appearance: She is not ill-appearing. HENT:    Head: Normocephalic and  atraumatic. Eyes:    General:       Right eye: No discharge.       Left eye: No discharge.    Extraocular Movements: Extraocular movements intact.    Conjunctiva/sclera: Conjunctivae normal.    Comments: No lid lag noted. Neck:    Comments: No thyromegaly, palpable nodule or tenderness.Cardiovascular:    Rate and Rhythm: Tachycardia present. Rhythm irregular. Pulmonary:    Effort: Pulmonary effort is normal.    Breath sounds: Normal breath sounds. No wheezing or rales. Abdominal:    Palpations: Abdomen is soft.    Tenderness: There is no abdominal tenderness. There is no guarding or rebound. Musculoskeletal:    Right lower leg: Edema present.    Left lower leg: Edema present. Skin:   General: Skin is warm and dry.    Comments: Purple discoloration at feet Neurological:    General: No focal deficit present.    Mental Status: She is alert and oriented to person, place, and time.    Comments: Mild tremor in L arm. Psychiatric:       Mood and Affect: Mood normal.  Review of Labs/Diagnostics: Lab Review:I have reviewed the patient's pertinent labs. Significant findings are: Latest Reference Range & Units 11/24/22 12:15 Free T4 0.8 - 1.8 ng/dL 2.9 (H) TSH, 3rd Generation w/ Reflex to FT4 0.40 - 4.50 mIU/L 0.02 (L)  Latest Reference Range & Units 11/26/22 14:24 11/26/22 15:16 T3, Total See Comment ng/dL 161.0 (H)  Thyroid Peroxidase Ab <34 IU/mL  86 (H) (H): Data is abnormally high Latest Reference Range & Units 11/26/22 14:24 Sodium 136 - 144 mmol/L 139 Potassium 3.3 - 5.3 mmol/L 4.2 Chloride 98 - 107 mmol/L 104 CO2 20 - 30 mmol/L 24 Anion Gap 7 - 17  11 BUN 6 - 20 mg/dL 14 Creatinine 9.60 - 4.54 mg/dL 0.98 BUN/Creatinine Ratio 8.0 - 23.0  22.2 eGFR (Creatinine) >=60 mL/min/1.31m2 >60 Glucose 70 - 100 mg/dL 119 (H) Calcium 8.8 - 14.7 mg/dL 9.5 Magnesium 1.7 - 2.4 mg/dL 1.8 Phosphorus 2.2 - 4.5 mg/dL 4.4 Total Bilirubin <=8.2 mg/dL 0.5 Bilirubin, Direct <=9.5 mg/dL 0.2 Alkaline Phosphatase 9 - 122 U/L 111 Alanine Aminotransferase (ALT) 10 - 35 U/L 24 Aspartate Aminotransferase (AST) 10 - 35 U/L 33 (H): Data is abnormally high Latest Reference Range & Units 11/26/22 14:24 WBC 4.0 - 11.0 x1000/?L 3.7 (L) RBC 4.00 - 6.00 M/?L 4.74 Hemoglobin 11.7 - 15.5 g/dL 62.1 Hematocrit 30.86 - 45.00 % 41.00 MCV 80.0 - 100.0 fL 86.5 MCH 27.0 - 33.0 pg 29.7 MCHC 31.0 - 36.0 g/dL 57.8 RDW-CV 46.9 - 62.9 % 11.4 Platelets 150 - 420 x1000/?L 210 (L): Data is abnormally lowDiagnostic Review:I have reviewed the patient's pertinent Radiology report(s). CTA chest with no PE or other acute thoracic pathology.Electronically Signed by Harrie Jeans, MDPGY-[id number], Endocrine FellowBest Contact through [facility]

## 2022-11-27 NOTE — ED Notes
 RN called Pura Spice, MD

## 2022-11-27 NOTE — ED Notes
 2:23 AM   Kristie Frye is a 62 y.o. female who presents to the ED with CC of Tachycardia (Recent dx of a fib. Apple watch showed irregular tachycardia. Reports SOB/DOE. BLLE edema. HR 90-130s in triage. ) Patient states SOB on exertion and tachycardia. Pt denies any CP. HR 90-140's. Denies nausea, vomiting, diarrhea, fever, chills, lightheadedness, dizziness or diaphoresis. Pt is A&O x 4. States she was taken off metoprolol and was supposed to start diltiazem but hasn't yet.  PT's labs also showed PT to be in Thyroid storm. Floor Handoff Telemetry: 	[x]  Yes		[]  NoCode Status:   [x]  Full		[]  DNR		[]  DNI		Other (specify):Safety Precautions: []  Fall Risk  []  Sitter   []  Restraints	[]  Suicidal	[x]  None	Other (specify):Mentation/Orientation:	 A&O (Self, person, place, time) x    4      	 Disoriented to:                    	 Special Accommodations: []  Hearing impaired   []  Blind  []  Nonverbal  []  Cognitive impairmentOxygenation Upon Admission: [x]  RA	[]  NC	[]  Venti  []  Simple Mask []  Other	Baseline O2 Status? []  Yes	[]  NoAmbulation: [x]  Independent	[]  Cane   []  Walker	[]  Wheelchair	[]  Bedbound		[]  Hemiplegic	[]  Paraplegic	[]  QuadraplegicEliminiation: [x]  Independent	[]  Commode	[]  Bedpan/Urinal  []  Straight Cath []  Foley cath			[]  Urostomy	[]  Colostomy	Other (specify):Diarrhea/Loose stool : []  1x within 24h  []  2x within 24h  []  3x within 24h  [x]  None 	C.Diff Order: 	[]  Ordered- needs to be collected             []  Collected-sent to lab             []  Resulted - Negative C.Diff             []  Resulted - Positive C.Diff[x]  Not Ordered   [x]  N/ASkin Alteration: []  Pressure Injury []  Wound [x]  None []  Skin not assessedDiet: [x]  Regular/No order placed	[]  NPO		Other (specify):IV Access: [x]  PIV   []  PICC    []  Port    [] Central line    []  A-line    Other (specify)IVF/GTT Running Upon ED Departure? [x]  No	    []  Yes (specify):Outstanding Meds/Treatments/Tests:Patient Belongings:Are the belongings documented?          [x]  No	    []  YesIs someone taking belongings home?   [x]  No     []  Yes  Who? (specify)                                   Benard Rink, RN

## 2022-11-27 NOTE — ED Notes
 RN texted Pura Spice, MD

## 2022-11-27 NOTE — Other
 CARDIOLOGY CONSULT NOTE========================================================================= Consulting Attending Physician: Donald Siva, MDAdmission Diagnosis: Hyperthyroidism [E05.90]========================================================================= HISTORY OF PRESENT ILLNESS:61 YO F with HTN who presents with a constellation of symptoms including weight loss, weakness, loose stools, hot flashes who presented to ED after recent diagnosis of a.fib and found to have high HRs on watch Patient has been experiencing high HRs on watch for past month or two, which has been also associated with dyspnea on exertion.  She also has been experiencing constellation of above symptoms.  Recently went to PCP Dr. Tad Moore and was diagnosed with a.fib.  TSH obtained c/w hyperthyroidism Plan was to start on eliquis and dilt, but today noted HRs consistently high on watch and presented for ED eval.  She denies palps mostly, but can feel her heart racing once in a while No PND,orthopnea.  +DOENo CP Social: non-smoker, no EtOH 3 years.  APRN in psychology Family: mother with CAD/afib In ED, a.fib confirmed, rates varied low 100s-120s.  HDS.  Trops 31,12,14 TSH 0.02, T3 376, +thyroid peroxidase ab CTA P/E performed with no -PE or acute pathologyEcho obtained, EF 50-55%, no other significant findings, LA mildly dilated.  PMH PSH Past Medical History: Diagnosis Date  ADHD (attention deficit hyperactivity disorder)   Arrhythmia   occasional palpitation - distant past cleared by ekg  Dysthymia   GERD (gastroesophageal reflux disease)   Heart palpitations   Hypertension   Pneumonia   Reflux   Syncope   distant past - 25+ years ago  Past Surgical History: Procedure Laterality Date  ABLATION SAPHENOUS VEIN W/ RFA    LAPAROSCOPIC COLON RESECTION    partial removal of small intestine  URETHRAL SLING    Social History Family History Social History Tobacco Use  Smoking status: Never  Smokeless tobacco: Never Substance Use Topics  Alcohol use: Not Currently  Drug use: No Social History Substance and Sexual Activity Drug Use No  Family History Problem Relation Age of Onset  Coronary Artery Disease Mother   Diabetes Mother   Pulmonary embolism Mother   Heart disease Mother       atrial fibrillation  Thrombophilia Mother   Diabetes Father   Diabetes Brother   Hypertension Brother   Obesity Brother   Sleep apnea Brother   Diabetes Brother       prediabetic  Hypertension Brother   Obesity Brother   Sleep apnea Brother   Breast cancer Paternal Aunt       (p) great aunt   Colon cancer Neg Hx   Ovarian cancer Neg Hx   Uterine cancer Neg Hx   Prior to Admission Medications (Not in a hospital admission) Current Medications Scheduled Meds:Current Facility-Administered Medications Medication Dose Route Frequency Provider Last Rate Last Admin  apixaban (ELIQUIS) tablet 5 mg  5 mg Oral Q12H Donald Siva, MD   5 mg at 11/26/22 2002  methIMAzole (TAPAZOLE) tablet 20 mg  20 mg Oral Daily Donald Siva, MD   20 mg at 11/26/22 1916  [START ON 11/27/2022] propranoloL (INDERAL) Immediate Release tablet 60 mg  60 mg Oral BID Donald Siva, MD     Continuous Infusions:PRN Meds:. ROS  As above Allergies No Known Allergies Objective: Gross Totals (Last 24 hours) at 11/26/2022 2040Last data filed at 11/26/2022 1523Intake 1000 ml Output -- Net 1000 ml Physical Exam: Temp:  [97.9 ?F (36.6 ?C)] 97.9 ?F (36.6 ?C)Pulse:  [89-143] 116Resp:  [14-27] 20BP: (121-135)/(66-86) 121/66SpO2:  [94 %-96 %] 95 %Device (Oxygen Therapy): room airGeneral: No Acute Distress, Neck: Supple, No JVDPulmonary: CTAB;  Normal effort; No wheezes/rhonchi/rales Cardiac IRIR tachycardicAbdomen: Soft, non-tenderPsychiatric: Appropriate, Alert and Oriented x3 Extremities: n osignificant pitting edemaCBCRecent Labs Lab 10/06/241424 WBC 3.7* HGB 14.1 HCT 41.00 PLT 210 LFTsRecent Labs Lab 10/06/241424 ALT 24 AST 33 ALKPHOS 111 BILITOT 0.5 BILIDIR 0.2  BMPRecent Labs Lab 10/04/241215 10/06/241424 NA 143 139 K 4.6 4.2 CL 104 104 CO2 32 24 BUN 18 14 CREATININE 0.73 0.63 CALCIUM 10.0 9.5 MG  --  1.8 PHOS  --  4.4 CoagsNo results for input(s): INR, PTT in the last 168 hours. Cardiac EnzymesNo results for input(s): TROPONINI, TROPONINT, CKTOTAL, CKMB in the last 72 hours. pBNPLab Results Component Value Date  BNPPRO 179 (H) 11/07/2022  BNPPRO 51.0 05/03/2017 No results found for: BNP LipidsLab Results Component Value Date  CHOL 189 01/20/2022  HDL 51 01/20/2022  LDL 114 (H) 01/20/2022  TRIG 126 01/20/2022  A1CLab Results Component Value Date  HGBA1C 6.4 09/15/2022  Other Labs   IMPRESSION:61 YO w/ HTN who presented with fast HRs on smartwatch in setting of new diagnosis of a.fib and hyperthyroidism Rates low 110s.  HDS StableShe may be symptomatic from her a.fib (new DOE over past month).  EF normal.  Mild LA enlargement.  May be challenging to restore NSR currently given hyperthyroidism.  Will start with propranolol given hyperthyroidism for rate control.  Can titrate up for goal of avg HR <110 as her LVEF is normal.  She is being treated methimazole, suspect that over time, less rate control will be needed.  Should she remain in a.fib once thyroid function normalizes (as outpatient), she may benefit from DCCV and/or rhythm control strategy given symptoms She is a CHADS2VASc 1.  R/B discussed re: AC.  She would prefer [facility] for now.  She may not need lifelong AC as this is provoked a.fib in setting of hyperthyroidism.  In future, with normal thyroid function, if NSR is restored, an extended monitor can be obtained to assess long term need for [facility].Regarding her troponin elevation, this is likely in setting of afib/hyperthyroid.  She has no concerning symptoms for ACS, ECG without ischemic findings, and LVEF normal with no RWMA.  No further inpatient work up required.  She has severe muscle weakness over past few months.  Would obtain CK to ensure no hyperthyroid related myopathies.  Lorene Dy, MD Assistant Professor of MedicineSection of Cardiology [facility]

## 2022-11-27 NOTE — Progress Notes
 [facility]	 [facility]	Medicine Progress NoteHospitalist ServiceAttending Provider: Judith Part, MD [phone]:  C02/C02Hospital Day #1 Subjective: Interim History: Seen and evaluated. No acute overnight events. Afebrile. Reports chills/feeling cold over arms. Denies chest pain, no palpitations. Remains tachycardic. Denies dizziness/lightheaded. States her legs felt like logs, but pitting edema has improved. She also reports lower extremity weakness. States she does not require assistance but legs feel a bit shaky on ambulating to the restroom. She reports approximately 20 pound weight loss in the past 3 weeks. ROS Review of Allergies/Meds/Hx: I have reviewed the patient's: allergies, current scheduled medications, current infusions, current prn medications, past medical history, past surgical history, family history, social history, and prior to admission medicationsObjective: Vitals:Temp:  [97.9 ?F (36.6 ?C)-98 ?F (36.7 ?C)] 97.9 ?F (36.6 ?C)Pulse:  [102-134] 111Resp:  [14-34] 19BP: (81-126)/(37-104) 126/104SpO2:  [92 %-97 %] 95 %Device (Oxygen Therapy): room air I/O's:I have reviewed the patient's current I&O's as documented in the EMR.No intake or output data in the 24 hours ending 11/27/22 1907Physical ExamGen: Alert, NAD, diaphoreticEyes: no ophthalmopathy CV: S1/S2, tachcyardicResp: lungs clear to auscultation bilaterallyAbd: soft, NT/ND, BS presentExt: pedal edema notedSkin: warm/well perfusedAccess:  PIVFoley: NoLabs:Recent Labs Lab 10/04/241215 10/06/241424 10/07/240519 NA 143 139 141 K 4.6 4.2 4.2 CL 104 104 106 CO2 32 24 24 BUN 18 14 15  CREATININE 0.73 0.63 0.63 GLU 203* 107* 134* CALCIUM 10.0 9.5 9.1 MG  --  1.8  --  PHOS  --  4.4  --  Recent Labs Lab 10/06/241424 10/07/240519 WBC 3.7* 5.1 HGB 14.1 12.7 HCT 41.00 38.50 PLT 210 191 MCV 86.5 87.5 NEUTROPHILS 37.8* 29.2* MONOCYTES 11.6 10.6 No results for input(s): PHART, PCO2ART, PO2ART, O2SATART, BEART, HCO3ART in the last 168 hours.Diagnostics:US ThyroidResult Date: 10/7/20241. Bilateral heterogenous echogenicity of the thyroid gland suggests thyroiditis. Thyroid is not markedly enlarged. There is no significant hyperemia on color flow imaging. 2. Left TR3 nodule for which no follow-up is required per TIRADS criteria. [facility] Notify System Classification: Trackable imaging recommendation. Report initiated by:  Ulysees Barns, MD Reported and signed by: Merdis Delay, MD  [facility] Biomedical Imaging   ECG/Tele Events: Results for orders placed or performed during the hospital encounter of 11/26/22 EKG Result Value Ref Range  Heart Rate 135 bpm  QRS Interval 86 ms  QT Interval 311 ms  QTC Interval 466 ms  P Axis 0 deg  QRS Axis 42 deg  T Wave Axis 90 deg  P-R Interval 0 msec  SEVERITY Abnormal ECG severity Scheduled Meds:Current Facility-Administered Medications Medication Dose Route Frequency Provider Last Rate Last Admin  apixaban (ELIQUIS) tablet 5 mg  5 mg Oral Q12H Donald Siva, MD   5 mg at 11/27/22 0843  cholecalciferol (vitamin D3) tablet 1,000 Units  1,000 Units Oral Daily Britta Mccreedy, MD   1,000 Units at 11/27/22 0842  DULoxetine (CYMBALTA) DR capsule 60 mg  60 mg Oral Daily Britta Mccreedy, MD   60 mg at 11/27/22 0842  [START ON 11/28/2022] methIMAzole (TAPAZOLE) tablet 15 mg  15 mg Oral Daily Donnetta Simpers, PA      potassium chloride SA (K-DUR,KLOR-CON M) 24 hr tablet 20 mEq  20 mEq Oral Daily Britta Mccreedy, MD   20 mEq at 11/27/22 0842  propranoloL (INDERAL) Immediate Release tablet 60 mg  60 mg Oral TID Donnetta Simpers, PA   60 mg at 11/27/22 1630  sodium chloride 0.9 % flush 3 mL  3 mL IV Push Q8H Britta Mccreedy, MD   3  mL at 11/27/22 1407 Assessment: Patient is a 62 y.o. 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 Plan: #Thyrotoxicosis - Endocrine input appreciated. 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 - follow TSI and TRAB. TPO antibodies mildly elevated- plan is for outpatient follow up with Endocrine- muscle weakness reported, CK WNL#atrial fibrillation#troponinemia- goal is to titrate HR to < 110- continue propranolol 60 mg TID- continue telemetry monitoring- trop elevation thought to be in setting of atrial fibrillation/hyperthyroidisim, no further work up indicated, ECHO without RWMA, EF WNL- CK obtained and WNL- continue apixaban, long term anticoagulation per cardiology #GAD/depression- continue cymbalta#ADHD- continue to hold stimulants given hyperthyroidism# Inpatient BundleDiet: Diet Regular Activity/Mobility:  Code Status: Full Code DVT ppx: DOACDispo Plan/Barriers:Admit Med Rec: Medical decision making for this patient was moderate.I spent 35 minutes today on this encounter before, during and after the visit, reviewing labs and records, evaluating and examining the patient, entering orders and documenting the visit. Donell Beers, MD  Internal Medicine Hospitalist AttendingYale Northern Maine Medical Center [facility]/NEMGContact via : [facility] or Smart Web10/08/2022 7:07 PM

## 2022-11-27 NOTE — Other
 [facility]	 Evaluation for [facility] to evaluate patient at the request of Algie Coffer, [place] MD for tachycardiaPatient seeing and examined on 11/26/22 at 7:50pm56yo female with PMH significant for ADHD, GERD, HTN and obesity; recently found to be on aFib (prescribe diltiazem and eliquis, has not started yet) presents to ED with persistent tachycardia / aFib (based on apple watch); noticed to have HR: 130-140s, elevated T3 / free T4 with suppressed TSH; d-dimer 1.42 --> CTA negative for PECurrently denied chest pain, no palpitations (refers has never felt them), SoB currently resolved, but usually with exertion. No dizziness, no lightheaded. On exam: AOx4CV: tachycardia, irregular, irregular. No murmurs appreciatedResp: clear bilaterallyOutpatient medications relevant fordexmethylphenidate DuloxetineFurosemide 20-40mg  dailyMetoprolol 50mg  XLI have reviewed the patient's labs / imaging data within the last 24 hrs. 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 - s/o Propanolol 60mg  --> 40mg  with current HR: 110s-120s. MAP: 70-80s. Goal HR < 110. Monitor on telemetry- Started on methimazole. (Plan for [facility] with eliquis). - Cardiology and endocrine followingAbove discussed with ER MD / team and RN staffNo current indication for SDU, if any changes in vitals / exam / data compared to documentation above or any acute changes in condition please don't doubt in reaching out.Signed: Dewaine Conger, MDMobile heart beat: [phone]

## 2022-11-27 NOTE — ED Notes
 5:31 AM RN collected blood specimens and sent to lab. 4:28 RN then called Provider. RN received verbal orders and was told the provider would be placing the orders in the computer. RN went ahead and followed up with the orders accordingly.4:11 AM RN texted Inetta Fermo, MD and requested orders to address the hypotension. 4:00 AM - PT called RN to room. PT presenting hypotensive. RN readjusted cuff and retook her BP. PT remained hypotensive. RN then changed cuff to the other arm. PT's BP remained hypotensive. (See Flowsheet for details). 11:59 PM RN rounded on PT. PT resting quietly with eyes closed. NAD. Breathing is even and unlabored. RN witnessed chest rise and fall. PT remains on the monitor. PT continues to remain in A.Fib. (see Flowsheet). 9:59 PM PT ambulated to the restroom. Upon return RN placed PT back on the monitor. 7:47 PM RN rounded on PT, introduced self. RN provided PT with food and beverage. PT denied needing anything at this time. 7:18 PM Verbal report given to this on-coming RN. PT care transferred at this time.

## 2022-11-27 NOTE — H&P
 Medicine History & Physical	History provided by:  PatientPatient presents from:  HomeBaseline Functional Status/ Living Situation:  Lives with familyChief Complaint:  My family told me to come to the hospital due to relentless tachycardia - Pt. Is Nurse practitionerHistory of Present Illness:  Kristie Frye is a 62 y.o. female patient with medical conditions significant for anxiety/depression , recently diagnosed atrial fibrillation with secondary hypercoagulable state w/ planned anticoagulation presents to the ED due to tachycardia/palpitations which according to the patient has been going on intermittently over past few months and now has worsened in duration and frequency.  Patient informs of associated shortness of breath with minimal exertion.  Patient also informs of generalized weakness, fine motor and gross tremors, myalgias, loose stools and up to 20 lb of weight loss over past 2 weeks.  Patient denies any prior history of thyroid related pathology.  Denies any swelling or noticing protrusion at the eyes however has been noticing blurry vision over past few months which has been worsening with time.  Denies any neck pain, fever/chills or any recent infections.  No other new symptoms reported.Patient informs that she has seen her PCP for above symptoms and was prescribed Cardizem and apixaban which she has not started taking yet.ED Course:    11/26/2022  10:13 PM 11/26/2022   6:57 PM 11/26/2022   6:56 PM 11/26/2022   6:49 PM 11/26/2022   6:18 PM 11/26/2022   6:00 PM 11/26/2022   5:53 PM Patient Vitals (Clinician entered, Pt entered, device entered) Encounter Blood Pressure 107/61 121/66    121/73 128/73 Encounter Heart Rate 113 116 113 122    Encounter Respiratory Rate 18 20 21 20 18 18 20  Encounter Pulse Ox 97 % 95 % 94 % 95 % 96 % 94 % 96 %  I personally reviewed the labs over last 24 hours.I have personally reviewed the patient's EKG results over the last 24 hours which reveals atrial fibrillation with rapid ventricular response.I have personally reviewed the patient's Radiology results over the last 24 hoursPatient was given TODAY YOU RECEIVED:   sodium chloride 0.9 % (new bag) bolus 1,000 mL     propranoloL (INDERAL) Immediate Release tablet 60 mg     iohexoL (OMNIPAQUE) 350 mg iodine/mL injection 80 mL     sodium chloride 0.9% large volume syringe for autoinjector 90 mL     perflutren lipid microspheres (DEFINITY) 1.3 mL in sodium chloride 0.9% PF 8.7 mL injection     methIMAzole (TAPAZOLE) tablet 20 mg     propranoloL (INDERAL) Immediate Release tablet 40 mg     apixaban (ELIQUIS) tablet 5 mg   Review of Systems: The 10-point review of system performed and was negative except for above.Past Medical History: Past Medical History: Diagnosis Date  ADHD (attention deficit hyperactivity disorder)   Arrhythmia   occasional palpitation - distant past cleared by ekg  Dysthymia   GERD (gastroesophageal reflux disease)   Heart palpitations   Hypertension   Pneumonia   Reflux   Syncope   distant past - 25+ years ago  Family History Problem Relation Age of Onset  Coronary Artery Disease Mother   Diabetes Mother   Pulmonary embolism Mother   Heart disease Mother       atrial fibrillation  Thrombophilia Mother   Diabetes Father   Diabetes Brother   Hypertension Brother   Obesity Brother   Sleep apnea Brother   Diabetes Brother       prediabetic  Hypertension Brother   Obesity Brother  Sleep apnea Brother   Breast cancer Paternal Aunt       (p) great aunt   Colon cancer Neg Hx   Ovarian cancer Neg Hx   Uterine cancer Neg Hx   Past Surgical History: Procedure Laterality Date  ABLATION SAPHENOUS VEIN W/ RFA    LAPAROSCOPIC COLON RESECTION    partial removal of small intestine  URETHRAL SLING    Social History Tobacco Use  Smoking status: Never  Smokeless tobacco: Never Substance Use Topics  Alcohol use: Not Currently  PTA Medications:  Marland Kitchen  Medication Sig Taking? albuterol sulfate 90 mcg/actuation HFA aerosol inhaler INHALE 2 PUFFS INTO THE LUNGS EVERY 6 HOURS AS NEEDED FOR WHEEZING  apixaban (ELIQUIS) 5 mg tablet Take 1 tablet (5 mg total) by mouth 2 (two) times daily.  cholecalciferol, vitamin D3, 25 mcg (1,000 unit) tablet Take 1 tablet (1,000 Units total) by mouth daily.  clobetasoL (TEMOVATE) 0.05 % external solution Apply topically 2 (two) times daily.  dexmethylphenidate (FOCALIN XR) 20 mg 24 hr capsule take 1 capsule by mouth dailyPatient not taking: Reported on 11/24/2022  dexmethylphenidate (FOCALIN) 10 mg tablet take 1 tablet by mouth twice a dayPatient not taking: Reported on 11/10/2022  dilTIAZem CD (CARDIZEM CD) 120 mg 24 hr capsule Take 1 capsule (120 mg total) by mouth daily.  DULoxetine (CYMBALTA) 60 mg capsule take 1 capsule by mouth daily  furosemide (LASIX) 20 mg tablet 1-2 tabs daily as directed by md  ivermectin (SOOLANTRA) 1 % cream Apply 1 Application topically 2 (two) times daily.  ketoconazole (NIZORAL) 2 % shampoo   metoprolol succinate XL (TOPROL XL) 50 mg 24 hr tablet Take 1 tablet (50 mg total) by mouth daily. Take with or immediately following a meal.  metroNIDAZOLE (METROGEL) 0.75 % gel   omeprazole (PRILOSEC) 40 mg capsule take 1 capsule by mouth daily  potassium chloride (K-TAB,KLOR-CON) 10 MEQ extended release tablet Take 2 tablets (20 mEq total) by mouth daily.  Allergies:  Allergies as of 11/26/2022  (No Known Allergies) Current Medications: Scheduled Meds:Current Facility-Administered Medications Medication Dose Route Frequency Provider Last Rate Last Admin  apixaban (ELIQUIS) tablet 5 mg  5 mg Oral Q12H Donald Siva, MD   5 mg at 11/26/22 2002 methIMAzole (TAPAZOLE) tablet 20 mg  20 mg Oral Daily Donald Siva, MD   20 mg at 11/26/22 1916  [START ON 11/27/2022] propranoloL (INDERAL) Immediate Release tablet 60 mg  60 mg Oral BID Donald Siva, MD      [START ON 11/27/2022] sodium chloride 0.9 % flush 3 mL  3 mL IV Push Q8H Gennette Shadix, MD     Continuous Infusions:PRN Meds:sodium chloride Objective:  Vitals:Temp:  [97.9 ?F (36.6 ?C)] 97.9 ?F (36.6 ?C)Pulse:  [89-143] 113Resp:  [14-27] 18BP: (107-135)/(61-86) 107/61SpO2:  [94 %-97 %] 97 %Device (Oxygen Therapy): room air  I/O's:Intake/Output Summary (Last 24 hours) at 11/26/2022 2246Last data filed at 11/26/2022 1523Gross per 24 hour Intake 1000 ml Output -- Net 1000 ml  Wt Readings from Last 5 Encounters: 11/24/22 117 kg 11/10/22 121.1 kg 10/31/22 122 kg 09/15/22 125.3 kg 04/14/22 124.1 kg Physical ExamVitals and nursing note reviewed. Constitutional:     General: She is not in acute distress.   Appearance: Normal appearance. She is not ill-appearing, toxic-appearing or diaphoretic. HENT:    Head: Normocephalic and atraumatic.    Nose: No congestion or rhinorrhea.    Mouth/Throat:    Mouth: Mucous membranes are moist. Eyes:    General:  Right eye: No discharge.       Left eye: No discharge.    Extraocular Movements: Extraocular movements intact.    Pupils: Pupils are equal, round, and reactive to light. Neck:    Thyroid: No thyroid mass, thyromegaly or thyroid tenderness. Cardiovascular:    Rate and Rhythm: Regular rhythm. Tachycardia present.    Pulses: Normal pulses.    Heart sounds: Normal heart sounds. Pulmonary:    Effort: Pulmonary effort is normal. No respiratory distress.    Breath sounds: Normal breath sounds. No wheezing or rales. Chest:    Chest wall: No tenderness. Abdominal:    General: There is no distension.    Palpations: Abdomen is soft.    Tenderness: There is no abdominal tenderness. There is no right CVA tenderness, left CVA tenderness or guarding. Musculoskeletal:    Right lower leg: No edema.    Left lower leg: Left lower leg edema: Progressively worsening. Lymphadenopathy:    Cervical: No cervical adenopathy. Skin:   General: Skin is warm.    Capillary Refill: Capillary refill takes less than 2 seconds.    Findings: No lesion or rash. Neurological:    General: No focal deficit present.    Mental Status: She is alert and oriented to person, place, and time. Mental status is at baseline.    Cranial Nerves: No cranial nerve deficit.    Motor: No weakness.    Gait: Gait normal.    Deep Tendon Reflexes: Reflexes normal.  Subjective: Recent Labs Lab 10/04/241215 10/06/241424 NA 143 139 K 4.6 4.2 CL 104 104 CO2 32 24 BUN 18 14 CREATININE 0.73 0.63 GLU 203* 107* ANIONGAP  --  11 Recent Labs Lab 10/06/241424 AST 33 ALT 24 ALKPHOS 111 BILITOT 0.5 BILIDIR 0.2 ALBUMIN 3.6  Recent Labs Lab 10/04/241215 10/06/241424 CALCIUM 10.0 9.5 MG  --  1.8 PHOS  --  4.4 Lab Results Component Value Date  BNPPRO 179 (H) 11/07/2022  BNPPRO 51.0 05/03/2017 No results for input(s): NITRITE, LEUKOCYTESUR, WBCUA, BACTERIA, EPITHELIALCE, COGRCA in the last 168 hours. Recent Labs Lab 10/06/241424 WBC 3.7* HGB 14.1 HCT 41.00 PLT 210 NEUTROPHILS 37.8*  No results for input(s): PTT, INR in the last 168 hours.No results for input(s): PHART, PCO2ART, PO2ART, HCO3ART, O2SATART in the last 168 hours.No results for input(s): LACTATE in the last 168 hours.No results for input(s): TROPONINT, CKTOTAL, CKMB in the last 168 hours.Invalid input(s): LNK, TROPONINPOCBlood, UA Date Value Ref Range Status 05/02/2017 Negative Negative Final Leukocytes, UA Date Value Ref Range Status 05/02/2017 Positive (A) Negative Final Nitrite, UA Date Value Ref Range Status 05/02/2017 Negative Negative Final  Micro: _ Blood culture  Collection Time: 05/02/17  2:31 PM  Specimen: Peripheral; Blood Result Value  Blood Culture No Growth after 5 days of incubation I have personally reviewed the patient's Micro results over the last 24 hoursRadiology:CTA PE (CHEST)Result Date: 11/26/2022 No evidence of pulmonary embolism or acute thoracic pathology. [facility] Notify System Classification: Routine. Reported and signed by: Earl Many, MD  [facility] Radiology and Biomedical Imaging XR Chest PA and LateralResult Date: 11/26/2022 No acute cardiothoracic abnormality. [place] [facility] Notify System Classification: Routine. Report initiated by:  Dorina Hoyer, MD Reported and signed by: Shon Millet, MD  [facility] Radiology and Biomedical Imaging   I have personally reviewed the patient's Radiology results over the last 24 hours Assessment : BYANCA KASPER is a 62 y.o. 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  -start with propranolol 60 mg q.12h hourly-appreciate endocrinology recommendationsNew Onset Afib, provoked iso HyperthyroidismType II MI-Propranolol as above-Can hold Cardizem as of now which pt. Has not started taking-patient's chads Vasc score is 1.  Per discussion with Cardiology, patient is preferring anticoagulation for now however she might not need long-term anticoagulation if AFib resolves after thyroid function is restored and patient reverts to normal sinus rhythm.  Consider extended monitor the access anticoagulation needs for long-term.-appreciate Cardiology recommendationsGAD/Depression-C/w home Cymbalta 60 mg once dailyCode Status Full Code DVT Prophylaxis Eliquis ADMIT MED REC Done with patient PCP Alroy Dust [phone] Emergency contact Extended Emergency Contact InformationPrimary Emergency Contact: Dohrman,KennethHome Phone: [phone] D/C Status Admit to  Disposition Pending clinical improvement Signed:Mercedes Valeriano MDThis note may have been generated using M*Modal voice dictation software please excuse any typographical errors due to flaws in voice recognition.

## 2022-11-27 NOTE — Progress Notes
 [facility]	 [facility]	Endocrine Progress NoteAttending Provider: Judith Part, MD Impression: Kristie Frye is a 62 y.o. 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 Seen by Endocrinology Consult Service for thyrotoxicosis. Etiology of thyrotoxicosis unclear at the time. Given severity of disease, toxic thyroid adenoma unlikely. Cannot obtain thyroid uptake and scan at this time. She lacks goiter, exophthalmos which may be seen in patient's with Graves disease. Antibodies will help [place] differentiate between thyroiditis or Graves disease. If this is a thyroiditis, expect rapid improvement in FT4 levels and methimazole would not be indicated. Since she developed atrial fibrillation, will continue to treat her in case this is Graves disease.Thyrotoxic patients often need more beta blockade than a healthy person. This patient is still tachycardic, needs more beta blockade. Recommendations: Inpatient Recommendations:Decrease methimazole to 15 mg dailyIncrease propanolol to 60 mg TID. If HR remains >100, please increase to 4 times dailyFollow TSI, TRABRepeat TFTs day of discharge and weeklyMonitor LFTs and CBC while on methimazoleFrom this consultant's standpoint, the patient may be discharged when medically ready, can continue thyroid evaluation in the outpatient setting.The above recommendations have been discussed with the attending, Dr. Arbutus Ped.Please contact us with any additional questions.. Subjective: Interim History: Patient has remained tachycardic overnight. ROS:  12 point ROS completed and negative except as mentioned above. Inpatient Medications:I have reviewed the patient's current medication orders.Objective: Vitals:Vitals:  11/27/22 0926 11/27/22 0940 11/27/22 1044 11/27/22 1245 BP:   112/84  Pulse: (!) 108 (!) 110 (!) 110 (!) 107 Resp: 17 (!) 22 18 (!) 23 Temp:   97.9 ?F (36.6 ?C)  TempSrc:   Oral  SpO2: 94% (!) 93% 94% 95% I/O's:I/O last 3 completed shifts:In: 1000 [I.V.:1000]Out: - No intake/output data recorded.Gross Totals (Last 24 hours) at 11/27/2022 1258Last data filed at 11/26/2022 1523Intake 1000 ml Output -- Net 1000 ml Endocrine Physical Exam:Patient was undergoing thyroid US, could not be evaluated.Labs:I have reviewed the patient's labs within the last 24 hrs. Significant findings are  05/15/20 09:36 11/24/22 12:15 11/26/22 14:24 11/26/22 15:16 Free T4  2.9 (H)   T3, Total   376.0 (H)  Thyroid Peroxidase Ab    86 (H) TSH, 3rd Generation w/ Reflex to FT4 2.54 0.02 (L)    11/27/22 05:19 WBC 5.1 ANC (Abs Neutrophil Count) 1.48 (L) Absolute Lymphocyte Count 2.83 Monocytes (Absolute) 0.54 Eosinophil Absolute Count 0.20 Basophils Absolute 0.02 Immature Granulocytes (Abs) 0.01  11/26/22 14:24 Alanine Aminotransferase (ALT) 24 Aspartate Aminotransferase (AST) 33 AST/ALT Ratio 1.4 Total Protein 6.3 Albumin 3.6 Diagnostics:I have reviewed the patient's Radiology report(s) within the last 48 hrs. Significant findings are Thyroid US 10/7/241. Bilateral heterogenous echogenicity of the thyroid gland suggests thyroiditis. Thyroid is not markedly enlarged. There is no significant hyperemia on color flow imaging.2. Left TR3 nodule for which no follow-up is required per TIRADS criteria. Etter Sjogren, MDPGY-5, Endocrine Fellow

## 2022-11-27 NOTE — Utilization Review (ED)
 UM Status: Commercial - IP-  tachycardia, IVF, endocrinology consulted.

## 2022-11-28 ENCOUNTER — Encounter: Admit: 2022-11-28 | Payer: PRIVATE HEALTH INSURANCE | Attending: "Endocrinology | Primary: Internal Medicine

## 2022-11-28 DIAGNOSIS — E0590 Thyrotoxicosis, unspecified without thyrotoxic crisis or storm: Secondary | ICD-10-CM

## 2022-11-28 LAB — THYROGLOBULIN PANEL     (BH GH LMW Q YH)
BKR THYROGLOBULIN (ROCHE): 112 ng/mL — ABNORMAL HIGH
BKR THYROGLOBULIN AB (ROCHE): <15 [IU]/mL (ref <115.0)

## 2022-11-28 LAB — T3, FREE: BKR T3 FREE: 7.2 pg/mL

## 2022-11-28 LAB — T4, FREE: BKR FREE T4: 2.69 ng/dL — ABNORMAL HIGH

## 2022-11-28 LAB — TSH: BKR THYROID STIMULATING HORMONE: <0.005 u[IU]/mL — ABNORMAL LOW

## 2022-11-28 MED ORDER — PANTOPRAZOLE 40 MG TABLET,DELAYED RELEASE
40 mg | Freq: Every day | ORAL | Status: DC
Start: 2022-11-28 — End: 2022-11-29
  Administered 2022-11-29: 14:00:00 40 mg via ORAL

## 2022-11-28 MED ORDER — METHIMAZOLE 5 MG TABLET
5 | ORAL_TABLET | Freq: Every day | ORAL | 2 refills | Status: AC
Start: 2022-11-28 — End: 2022-11-29

## 2022-11-28 NOTE — ED Notes
 7:46 AM Report from previous shift RN, assumed care at this time, pt appears to be resting comfortable, no s/s pain/disc, no acute distress observedChief Complaint Patient presents with  Tachycardia   Recent dx of a fib. Apple watch showed irregular tachycardia. Reports SOB/DOE. BLLE edema. HR 90-130s in triage.  Past Medical History: Diagnosis Date  ADHD (attention deficit hyperactivity disorder)   Arrhythmia   occasional palpitation - distant past cleared by ekg  Dysthymia   GERD (gastroesophageal reflux disease)   Heart palpitations   Hypertension   Pneumonia   Reflux   Syncope   distant past - 25+ years ago 9:51 AMMeds as ordered, pt ambulated to bathroom independently, gait steady

## 2022-11-28 NOTE — Progress Notes
 [facility]	 [facility]	Endocrine Progress NoteAttending Provider: Billey Co, MD Impression: Kristie Frye is a 62 y.o. 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 Seen by Endocrinology Consult Service for thyrotoxicosis. Etiology of thyrotoxicosis unclear at the time. Given severity of disease, toxic thyroid adenoma unlikely. Cannot obtain thyroid uptake and scan at this time. She lacks goiter, exophthalmos which may be seen in patient's with Graves disease. Antibodies will help [place] differentiate between thyroiditis or Graves disease. If this is a thyroiditis, expect rapid improvement in FT4 levels and methimazole would not be indicated. Since she developed atrial fibrillation, will continue to treat her in case this is Graves disease.Thyrotoxic patients often need more beta blockade than a healthy person. This patient is still tachycardic, needs more beta blockade. Since she is thyrotoxic, would not pursue cardioversion. Recommendations: Inpatient Recommendations:Increase methimazole to 20 mg dailyIncrease propanolol to 60 mg QID. Can be discharged on long acting propanolol or metoprololFollow TSI, TRABWill have TFTs drawn weekly Patient has follow up appointment arranged with Dr Arbutus Ped 11/20.The above recommendations have been discussed with the attending, Dr. Arbutus Ped.Please contact us with any additional questions.. Subjective: Interim History: Patient has remained tachycardic overnight. ROS:  12 point ROS completed and negative except as mentioned above. Inpatient Medications:I have reviewed the patient's current medication orders.Objective: Vitals:Vitals:  11/27/22 1826 11/27/22 2345 11/28/22 0627 11/28/22 1123 BP: (!) 126/104 114/86 118/88 124/78 Pulse: (!) 111 (!) 119 (!) 106 (!) 105 Resp: 19 20 18 20  Temp: 97.9 ?F (36.6 ?C) 98.8 ?F (37.1 ?C) 98.1 ?F (36.7 ?C) 98.4 ?F (36.9 ?C) TempSrc: Oral Oral Oral Oral SpO2: 95% 94% 94% 96% Weight:     Labs:I have reviewed the patient's labs within the last 24 hrs. Significant findings are  11/24/22 12:15 11/26/22 14:24 11/26/22 15:16 11/27/22 05:19 11/28/22 07:03 Thyroid Stimulating Hormone, 3rd Gen.     <0.005 (L) Free T4 2.9 (H)    2.69 (H) T3, Total  376.0 (H)    Thyroglobulin Ab    <15.0  Thyroglobulin    112.0 (H)  Thyroid Peroxidase Ab   86 (H)   TSH, 3rd Generation w/ Reflex to FT4 0.02 (L)     T3, Free     7.2 Diagnostics:I have reviewed the patient's Radiology report(s) within the last 48 hrs. Significant findings are Thyroid US 10/7/241. Bilateral heterogenous echogenicity of the thyroid gland suggests thyroiditis. Thyroid is not markedly enlarged. There is no significant hyperemia on color flow imaging.2. Left TR3 nodule for which no follow-up is required per TIRADS criteria. Etter Sjogren, MDPGY-5, Endocrine Fellow

## 2022-11-28 NOTE — ED Provider Notes
 Chief Complaint Patient presents with  Tachycardia   Recent dx of a fib. Apple watch showed irregular tachycardia. Reports SOB/DOE. BLLE edema. HR 90-130s in triage.  HPI/PE:This is a 62 year old female with a known history of hypertension presenting to the emergency with 3 weeks of shortness of breath she cold intolerance, approx 20 pound weight loss in <1 month, loose stools, jittery feeling palpitations lower extremity edema.  Patient states that she went to her primary who started workup noted that she might have been a new AFib on an EKG ordered her to be started on diltiazem as well as Eliquis both of which she was not yet picked up.  Patient had outpatient thyroid levels have come back abnormal yesterday family's at bedside stated that they performed and intervention that patient come to the emergency room today.  She denies any chest pain any fevers or chills no nausea or vomiting.ZOX:WRUE is a 62 year old female with the above-stated medical problems here in the emergency room with tachycardia new onset AFib with a setting of abnormal thyroid labs.  I believe patient likely has hyperthyroidism causing this AFib however we will assess for alternative causes such as infection, atypical ACS, PE, heart failure.  I performed a bedside ultrasound with good EF, IVC with good respiratory variation.  We will speak to both Cardiology endocrine regarding patient plan.  Physical ExamED Triage VitalsBP: 135/86 [11/26/22 1331]Pulse: 89 [11/26/22 1331]Pulse from  O2 sat: (!) 127 [11/26/22 1410]Resp: 16 [11/26/22 1331]Temp: 97.9 ?F (36.6 ?C) [11/26/22 1331]Temp src: Oral [11/26/22 1331]SpO2: 96 % [11/26/22 1331] BP 118/88  - Pulse (!) 106  - Temp 98.1 ?F (36.7 ?C) (Oral)  - Resp 18  - Wt 117.5 kg  - LMP 02/02/2013 (Exact Date)  - SpO2 94%  - BMI 38.81 kg/m? Physical ExamConstitutional:     Appearance: Normal appearance. HENT:    Head: Normocephalic. Eyes: Extraocular Movements: Extraocular movements intact.    Pupils: Pupils are equal, round, and reactive to light. Cardiovascular:    Rate and Rhythm: Tachycardia present. Rhythm irregular. Pulmonary:    Effort: Pulmonary effort is normal.    Breath sounds: Normal breath sounds. Abdominal:    Palpations: Abdomen is soft.    Tenderness: There is no abdominal tenderness. Musculoskeletal:       General: Normal range of motion.    Cervical back: Normal range of motion.    Right lower leg: Edema present.    Left lower leg: Edema present. Skin:   General: Skin is warm.    Capillary Refill: Capillary refill takes less than 2 seconds. Neurological:    General: No focal deficit present.    Mental Status: She is alert and oriented to person, place, and time.  ProceduresAttestation/Critical CarePatient Reevaluation: Patient accidentally signed up by me.  Seen by other attending.  I know involvement in this patient's care.Cameron Proud MD MPHComments as of 11/28/22 0634 Sun Nov 26, 2022 1452 Burch -Wartofsky Score for Thyrotoxicosis 30 (mild pedal edema, HR 120-129, a fib present) [NK] 1508 I have spoken Endocrine I have given recommendations regarding hyperthyroid workup.  Have also spoken to Cardiology we will start with metoprolol and Eliquis.  Metoprolol was started at 25 mg q.6 we can up titrate as needed as patient tolerates with a goal heart rate of under 110 [NK] 1517 Upon further discussion with Cardiology and speaking to occurred attending bar recommending stick with the original plan of propranolol 60 they are recommending the patient may not need to be started on Eliquis as  her Chads2vasc score is low and the AFib is provoked I will speak to patient via recommend the patient have formal echo and outpatient cardiology follow up. [NK] 1711 CTA- No evidence of pulmonary embolism or acute thoracic pathology.  [NK] 1716 Dr. Remus Loffler of cardiology at bedside- plan for formal Echo if appropriate pt can follow up with Cardiology Dr. Remus Loffler in clinic. Recc Propranolol 60mg  uptitrate as needed with goal HR <110.   [NK] 1859 Step-Down has evaluated patient- will reassess after 1 hour after she receives propranolol and methimazole [NK] 1945 Pt was seen by Step down- rec Telemetry Floor patient. If patient HR becomes elevated she may require escalation of care.  [NK] 2057 Diagnostic and treatment plan determined by initial Emergency Department team and signed out to me. 37F w/ ho htn, Afib who presents with tachycardia. Endocrinology: propanolol, methimazole Cardiology: echo, target HR of less than 110 SDU declined patientAdmitted to medicine  I believe the patient requires further evaluation for their presenting symptoms; thus, patient has been admitted for additional work-up and treatment.  Please see admitting service documentation for further information regarding their medical course after initial evaluation. [HK]  Comments User Index[HK] Khidir, Thedore Mins, MD[NK] Donald Siva, MD   Clinical Impressions as of 11/28/22 1610 Hyperthyroidism  ED DispositionAdmit Donald Siva, MD10/06/24 1952 Cameron Proud, MD10/07/24 1422 Khidir, Hazar H, MD10/08/24 9604 Donald Siva, MD10/08/24 1100

## 2022-11-28 NOTE — Treatment Summary
 Brief Cardiology Treatment Plan NoteThis is a 62 year old F with hx of HTN, HL, obesity, GERD, anxiety, depression who presented to the ED with chief concern of tachycardia, and noticing afib on her apple watch. She was recently seen by her PCP on 10/4 at which time there was concern for new onset afib and pt was advised to start on eliquis and cardizem. In the ED, she was tachycardic to the 130s, EKG confirmed new atrial fibrillation. Her workup was notable for TSH 0.02 and fT4 2.9. Endocrine and cardiology consulted given new onset afib in the setting of hyperthyroidism. Recommendations:- start propranolol 60mg  BID, uptitrate as tolerated aiming for HR <110bpm- obtain transthoracic echocardiogram- given CHA2DS2-VASc of 2, she doesn't meet strict criteria for long term AC, although would defer to risk/benefit discussion- ongoing management of hyperthyroidism per endo- refer to cardiology for outpatient follow upDiscussed with attending, Dr. Maurice March, MDCardiology Fellow

## 2022-11-28 NOTE — Other
 PHARMACY-ASSISTED MEDICATION REPORTPharmacist review of the best possible medication history obtained by the pharmacy medication history technician has been performed.  I have updated the home medication list and identified the following information that may be relevant to this admission.NOTES/RECOMMENDATIONS Consider starting diltiazem, omeprazole (protonix in-house)       Prior to Admission Medications Medication Name Sig Taking? Patient Reported   albuterol sulfate 90 mcg/actuation HFA aerosol inhalerLast dose:  --  INHALE 2 PUFFS INTO THE LUNGS EVERY 6 HOURS AS NEEDED FOR WHEEZING Yes     apixaban (ELIQUIS) 5 mg tabletLast dose:  -- Last Medication Note: >> Sheliah Mends Nov 27, 2022 10:13 AMMedHX Tech(Selina Roman, CPHT):  Patient reported prescription was picked up and started, however, no dispensed record on file.Entered by Jerilee Hoh, CPHT Mon Nov 27, 2022 1013 Take 1 tablet (5 mg total) by mouth 2 (two) times daily. Yes     cholecalciferol, vitamin D3, 25 mcg (1,000 unit) tabletLast dose:  --  Take 1 tablet (1,000 Units total) by mouth daily. Yes Yes   dilTIAZem CD (CARDIZEM CD) 120 mg 24 hr capsuleLast dose: Not TakingLast Medication Note: >> Sheliah Mends Nov 27, 2022 10:12 AMMedHX Tech(Selina Roman, CPHT):  Patient reported prescription was picked up but not started yet, no dispensed record.Entered by Jerilee Hoh, CPHT Mon Nov 27, 2022 1012 Take 1 capsule (120 mg total) by mouth daily.Patient not taking: Reported on 11/27/2022       DULoxetine (CYMBALTA) 60 mg capsuleLast dose:  --  take 1 capsule by mouth daily Yes     fluocinolone acetonide oil (DERMOTIC) 0.01 % ear dropsLast dose:  --  Place into both ears daily as needed. Yes Yes   ivermectin (SOOLANTRA) 1 % creamLast dose:  -- Last Medication Note: >> Sheliah Mends Nov 27, 2022 10:11 AMMedHX Tech(Selina Roman, CPHT): Patient reported that they are taking as prescribed, but last confirmed fill date was: [10/16/22 for 20-days]Entered by Jerilee Hoh, CPHT Mon Nov 27, 2022 1011 Apply 1 Application topically 2 (two) times daily. Yes     ketoconazole (NIZORAL) 2 % shampooLast dose:  -- Last Medication Note: >> Sheliah Mends Nov 27, 2022 10:11 AMMedHX Tech(Selina Roman, CPHT):  Patient reported that they are taking as prescribed, but last confirmed fill date was: [12/02/21 for 30-days]Entered by Jerilee Hoh, CPHT Mon Nov 27, 2022 1011 Apply topically twice a week. Yes Yes   methIMAzole (TAPAZOLE) 5 mg tabletLast dose:  --  Take 4 tablets (20 mg total) by mouth daily.       omeprazole (PRILOSEC) 40 mg capsuleLast dose:  -- Last Medication Note: >> Sheliah Mends Nov 27, 2022 10:10 AMMedHX Tech(Selina Roman, CPHT):  Patient reported that they are taking as prescribed, but last confirmed fill date was: [08/15/22 #30 for 30-days]Entered by Jerilee Hoh, CPHT Mon Nov 27, 2022 1010 take 1 capsule by mouth daily Yes     potassium chloride (K-TAB,KLOR-CON) 10 MEQ extended release tabletLast dose: Not TakingLast Medication Note: >> Sheliah Mends Nov 27, 2022 10:07 AMMedHX Tech(Selina Roman, CPHT):  Patient reported not taking on their own against LIP's recommendation. States she was eating bananas instead - LF 11/10/22 #30 for 15 daysEntered by Jerilee Hoh, CPHT Mon Nov 27, 2022 1007 Take 2 tablets (20 mEq total) by mouth daily.Patient not taking: Reported on 11/27/2022       Prior to admission  medications last reviewed by Jerilee Hoh, CPHT on Mon Nov 27, 2022 1014 Thank you,Amar Sippel Kathlene November, PharmD10/8/20243:10 PMPhone: MHB

## 2022-11-28 NOTE — Progress Notes
 [facility] Medicine Progress NoteAttending Provider: Billey Co, MD Subjective                                                                              Subjective: Interim History: pt is still in ED. While in bed she felt good, with walking her HR was in the range 43-122 - had episode of bradycardia, pt felt dizzy throughout with walking. Prefers to stay for another day.Review of Allergies/Meds/Hx: Review of Allergies/Meds/Hx:I have reviewed the patient's: current scheduled medicationsScheduled Meds:Current Facility-Administered Medications Medication Dose Route Frequency Provider Last Rate Last Admin  apixaban (ELIQUIS) tablet 5 mg  5 mg Oral Q12H Donald Siva, MD   5 mg at 11/28/22 3329  cholecalciferol (vitamin D3) tablet 1,000 Units  1,000 Units Oral Daily Britta Mccreedy, MD   1,000 Units at 11/28/22 0938  DULoxetine (CYMBALTA) DR capsule 60 mg  60 mg Oral Daily Britta Mccreedy, MD   60 mg at 11/28/22 5188  methIMAzole (TAPAZOLE) tablet 15 mg  15 mg Oral Daily Donnetta Simpers, [place]   15 mg at 11/28/22 4166  propranoloL (INDERAL) Immediate Release tablet 60 mg  60 mg Oral TID Donnetta Simpers, PA   60 mg at 11/28/22 0630  sodium chloride 0.9 % flush 3 mL  3 mL IV Push Q8H Britta Mccreedy, MD   3 mL at 11/28/22 0701 Continuous Infusions:PRN Meds:.sodium chloride Objective Objective: Vitals:Last 24 hours: Temp:  [97.9 ?F (36.6 ?C)-98.8 ?F (37.1 ?C)] 98.4 ?F (36.9 ?C)Pulse:  [105-125] 105Resp:  [18-26] 20BP: (114-126)/(76-104) 124/78SpO2:  [94 %-96 %] 96 %I/O's:No intake or output data in the 24 hours ending 11/28/22 1552Procedures:None Physical ExamCardiovascular:    Rate and Rhythm: Tachycardia present. Rhythm irregular. Pulmonary:    Effort: No respiratory distress.    Breath sounds: No wheezing or rales. Abdominal:    General: Bowel sounds are normal. There is no distension. Tenderness: There is no abdominal tenderness. There is no guarding. Skin:   Comments: Bluish skin changes on toes b/l, not new acc to the pt Neurological:    General: No focal deficit present.    Mental Status: She is oriented to person, place, and time.   Labs:Last 24 hours: Recent Results (from the past 24 hour(s)) TSH  Collection Time: 11/28/22  7:03 AM Result Value Ref Range  Thyroid Stimulating Hormone <0.005 (L) See Comment ?IU/mL T3, free  Collection Time: 11/28/22  7:03 AM Result Value Ref Range  T3, Free 7.2 See Comment pg/mL T4, free  Collection Time: 11/28/22  7:03 AM Result Value Ref Range  Free T4 2.69 (H) See Comment ng/dL Diagnostics:No new radiology.ECG/Tele Events: No ECG ordered today Assessment Assessment: Assessment:61 y.o. female with medical conditions significant for anxiety/depression , recently diagnosed atrial fibrillation presented with palpitations, trachycardia and found to be in thyrotoxicosis. I have reviewed the patient's problem list and updated it as needed. Plan Plan:     Hyperthyroidism: seen by Endocrine, cont methimazole at 15 mg qdRapid a.fib from hyperthyroidism with an episode of bradycardia while walking:Expect HR to keep improving as hyperthyroidism getting under controlBecause of episode of bradycardia will not go up on the dose of propranolol, cont 60 mg tid. At d/c would change to  same dose long acting, it's non-formulary in the hospital. Dispo: will stay in the hospital for another day because of dizziness with walking.Comorbidities Comorbidities present on admission:Coagulopathic due to prescription of anticoagulant medication prior to admission.Class 2 obesity noted, with  .  Secondary diagnoses occurring during hospitalization: Electronically Signed:Cesily Cuoco, MD Beeper MHB10/09/2022, 3:39 PM

## 2022-11-28 NOTE — ED Notes
 7:38 PM Chief Complaint Patient presents with  Tachycardia   Recent dx of a fib. Apple watch showed irregular tachycardia. Reports SOB/DOE. BLLE edema. HR 90-130s in triage.  Past Medical History: Diagnosis Date  ADHD (attention deficit hyperactivity disorder)   Arrhythmia   occasional palpitation - distant past cleared by ekg  Dysthymia   GERD (gastroesophageal reflux disease)   Heart palpitations   Hypertension   Pneumonia   Reflux   Syncope   distant past - 25+ years ago 11:21 PM Pt remains on monitor, pt remains on cardiac monitor, tachycardic into the low 100s, medicated per MAR, resting comfortably in bed in hall, eyes closed, breaths even and unlabored.

## 2022-11-29 DIAGNOSIS — E66812 Obesity, class 2: Secondary | ICD-10-CM

## 2022-11-29 DIAGNOSIS — I482 Chronic atrial fibrillation, unspecified: Secondary | ICD-10-CM

## 2022-11-29 DIAGNOSIS — Z79899 Other long term (current) drug therapy: Secondary | ICD-10-CM

## 2022-11-29 DIAGNOSIS — E0500 Thyrotoxicosis with diffuse goiter without thyrotoxic crisis or storm: Principal | ICD-10-CM

## 2022-11-29 DIAGNOSIS — R001 Bradycardia, unspecified: Secondary | ICD-10-CM

## 2022-11-29 DIAGNOSIS — I10 Essential (primary) hypertension: Secondary | ICD-10-CM

## 2022-11-29 DIAGNOSIS — I21A1 Myocardial infarction type 2: Secondary | ICD-10-CM

## 2022-11-29 DIAGNOSIS — F411 Generalized anxiety disorder: Secondary | ICD-10-CM

## 2022-11-29 DIAGNOSIS — Z6839 Body mass index (BMI) 39.0-39.9, adult: Secondary | ICD-10-CM

## 2022-11-29 DIAGNOSIS — D6869 Other thrombophilia: Secondary | ICD-10-CM

## 2022-11-29 DIAGNOSIS — K219 Gastro-esophageal reflux disease without esophagitis: Secondary | ICD-10-CM

## 2022-11-29 DIAGNOSIS — Z7901 Long term (current) use of anticoagulants: Secondary | ICD-10-CM

## 2022-11-29 DIAGNOSIS — F32A Depression, unspecified: Secondary | ICD-10-CM

## 2022-11-29 DIAGNOSIS — F909 Attention-deficit hyperactivity disorder, unspecified type: Secondary | ICD-10-CM

## 2022-11-29 LAB — TSH RECEPTOR BINDING ANTIBODY (TRAB) (BH GH LMW Q YH): THYROTROPIN RECEPTOR AB, S: 5.39 IU/L — ABNORMAL HIGH (ref 0.00–1.75)

## 2022-11-29 MED ORDER — METHIMAZOLE 5 MG TABLET
5 | ORAL_TABLET | Freq: Every day | ORAL | 1 refills | Status: AC
Start: 2022-11-29 — End: 2022-11-29

## 2022-11-29 MED ORDER — METHIMAZOLE 5 MG TABLET
5 | ORAL_TABLET | Freq: Every day | ORAL | 1 refills | Status: AC
Start: 2022-11-29 — End: ?

## 2022-11-29 MED ORDER — PROPRANOLOL IMMEDIATE RELEASE 20 MG TABLET
20 mg | Freq: Three times a day (TID) | ORAL | Status: DC
Start: 2022-11-29 — End: 2022-11-29
  Administered 2022-11-29: 19:00:00 20 mg via ORAL

## 2022-11-29 MED ORDER — APIXABAN 5 MG TABLET
5 | ORAL_TABLET | Freq: Two times a day (BID) | ORAL | 1 refills | Status: AC
Start: 2022-11-29 — End: ?

## 2022-11-29 MED ORDER — PROPRANOLOL IMMEDIATE RELEASE 80 MG TABLET
80 | ORAL_TABLET | Freq: Three times a day (TID) | ORAL | 1 refills | Status: AC
Start: 2022-11-29 — End: ?

## 2022-11-29 NOTE — Plan of Care
 Plan of Care Overview/ Patient Status     Case Management Plan  Flowsheet Row Most Recent Value Discharge Planning  Patient/Patient Representative was presented with a list of facilities, agencies and/or dme providers and Referral(s) placed for: None Mode of Transportation  Private car  (add comment for special considerations) CM D/C Readiness  PASRR completed and approved N/A Authorization number obtained, if required N/A Is there a 3 day INPATIENT Qualifying stay for Medicare Patients? N/A Medicare IM- signed, dated, timed and scanned, if required N/A DME Authorized/Delivered N/A No needs identified/ follow up with PCP/MD Yes Post acute care services secured W10 complete N/A Pri Completed and Accepted  N/A Is the destination address correct on the W10 N/A Finalized Plan  Expected Discharge Date 11/29/22 Discharge Disposition Home or Self Care   Kristie Frye [facility] RN,BSNCare Manager

## 2022-11-29 NOTE — Plan of Care
 Plan of Care Overview/ Patient Status    Case Management Screening and Evaluation  Flowsheet Row Most Recent Value Case Management Screening: Chart review completed. If YES to any question below then proceed to CM Eval/Plan  Is there a change in their cognitive function No Do you anticipate that the pt will have any discharge needs requiring CM intervention? No Has there been an unscheduled readmission within the last 30 days and/or four (4) encounters (encounters include: ED, OBS, Inpatient) within the last six (6) months? No Were there services prior to admission ( Examples: Assisted Living, HD, Homecare, Extended Care Facility, Methadone, SNF, Outpatient Infusion Center) No Negative/Positive Screen Negative Screening: Case Management department will follow patient's progress and discuss plan of care with treatment team. Case manager will take lead to arrange post-acute care services and continue to work with the care team as the patient progresses towards discharge Yes Case Manager Attestation  I have reviewed the medical record and completed the above screen. CM staff will follow patient's progress and discuss the plan of care with the Treatment Team. Yes   Pt form home, lives alone, independent at baseline, family/ friend for dc transportation.Case Management will take lead to arrange post -acute care services and continue to work with the care team as the patient progresses towards discharge.  Lorane Gell RN,BSNCare Manager

## 2022-11-29 NOTE — Plan of Care
 Problem: Adult Inpatient Plan of CareGoal: Plan of Care Review10/10/2022 1514 by Geryl Rankins, RNOutcome: Outcome(s) achieved10/10/2022 1428 by Geryl Rankins, RNOutcome: Interventions implemented as appropriateGoal: Patient-Specific Goal (Individualized)11/29/2022 1514 by Geryl Rankins, RNOutcome: Outcome(s) achieved10/10/2022 1428 by Geryl Rankins, RNOutcome: Interventions implemented as appropriateGoal: Absence of Hospital-Acquired Illness or Injury10/10/2022 1514 by Geryl Rankins, RNOutcome: Outcome(s) achieved10/10/2022 1428 by Geryl Rankins, RNOutcome: Interventions implemented as appropriateGoal: Optimal Comfort and Wellbeing10/10/2022 1514 by Geryl Rankins, RNOutcome: Outcome(s) achieved10/10/2022 1428 by Geryl Rankins, RNOutcome: Interventions implemented as appropriateGoal: Readiness for Transition of Care10/10/2022 1514 by Geryl Rankins, RNOutcome: Outcome(s) achieved10/10/2022 1428 by Geryl Rankins, RNOutcome: Interventions implemented as appropriate Problem: Mobility ImpairmentGoal: Optimal Mobility10/10/2022 1514 by Geryl Rankins, RNOutcome: Outcome(s) achieved10/10/2022 1428 by Geryl Rankins, RNOutcome: Interventions implemented as appropriate Problem: Fall Injury RiskGoal: Absence of Fall and Fall-Related Injury10/10/2022 1514 by Geryl Rankins, RNOutcome: Outcome(s) achieved10/10/2022 1428 by Geryl Rankins, RNOutcome: Interventions implemented as appropriate Problem: Skin Injury Risk IncreasedGoal: Skin Health and Integrity10/10/2022 1514 by Geryl Rankins, RNOutcome: Outcome(s) achieved10/10/2022 1428 by Geryl Rankins, RNOutcome: Interventions implemented as appropriate Plan of Care Overview/ Patient Status    Pt education completed, IV removed, and propranolol dose given per [facility].

## 2022-11-29 NOTE — Discharge Summary
 [facility] Discharge SummaryPatient Data:  Patient Name: Kristie Frye Admit date: 11/26/2022 Age: 62 y.o. Discharge date: 11/29/22 DOB: 11-01-1960	 Discharge Attending Physician: Melene Muller., MD  MRN: [id number]	 Discharged Condition: stable PCP: Alroy Dust, MD Disposition: Home  Principal Diagnosis: thyrotoxicosisSecondary Diagnoses:  Afib w RVRIssues to be Addressed Post Discharge: Issues to be Addressed Post Discharge:F/u PCPF/u cardiologyRelevant Medications on Discharge:New Medications: methimazole, propranololPending Labs and Tests: Pending Lab Results   Order Current Status  TSH Receptor binding antibody (TRAB) In process  Thyroid stimulating immunoglobulin In process  Follow-up Information:Indes, Lennox Laity, MD[address] [facility] [address] [phone] up on 10/11/20241 pm for PCP follow upStrosberg, Fortino Sic, MD[address] [facility] [place] [phone] up on 10/10/20249:15 am for vascular surgery follow upYM [facility] at [address] [place] [phone] Future Appointments Date Time Provider Department Center 11/30/2022  9:15 AM Belva Agee, MD [facility] 12/01/2022  1:00 PM Alroy Dust, MD [facility] [place] 12/01/2022  3:40 PM [facility] ECHO 2 [facility] Or Card 12/15/2022  3:00 PM Alroy Dust, MD [facility] 01/10/2023 12:15 PM Jessy Oto, MD [facility] [place] [facility] 02/15/2023 12:00 PM Alroy Dust, MD [facility] Course: Hospital Course: Kristie Frye is a 62 year old woman with PMHx of anxiety, depression, recently diagnosed afib admitted on 10/6 with tachycardia, found to be in thyrotoxicosis. In the ED patient was found to have HR up to 140s with palpitations, BP was stable. TSH was < 0.0005, T4 was 2.69. Endocrinology was consulted, patient was started on methimazole 20 mg daily and propranolol 60 mg BID with HR goal < 110. HR remained elevated by symptoms improved. On day of discharge patient was on 60 mg propranolol TID with HR ~ 100-110 at rest, going up to 130 with ambulation, though remained asymptomatic. I walked hallway with her on telemetry- pt wo palpitations, SOB or CP during ambulation. On discharge propranolol increased to 80 mg TID  with plan for OP follow up with PCP and cardiology for further titration, exepct that as her thyrotoxicosis improves, she will require less beta blocker. Instructed patient to record BP and HR and bring readings to provider visits, as well as return to ED if palpitations, CP or SOB occur and do not improve with rest. On day of discharge patient was awake, alert and oriented. In the afternoon ambulated halls wo issue. Inpatient Consultants and summary of recommendations:Endocrinology was consulted regarding thyrotoxicosis, she was started on methimazole 20 mg daily and has OP follow up scheduled 11/20. Weekly TFTs were ordered by endocrine team for monitoring.Cardiology was consulted regarding AFib, recommend rate control with propranolol to be further titrated by outpatient providers. A cardiology referral was sent on DC for follow up, could consider use of holter monitor to better monitor HR trends as thyroid levels stabilize. Pertinent Procedures or Surgeries: nonePertinent lab findings and test results: Objective: Recent Labs Lab 10/06/241424 10/07/240519 WBC 3.7* 5.1 HGB 14.1 12.7 HCT 41.00 38.50 PLT 210 191  Recent Labs Lab 10/06/241424 10/07/240519 NEUTROPHILS 37.8* 29.2*  Recent Labs Lab 10/04/241215 10/06/241424 10/06/241424 10/07/240519 NA 143 139  --  141 K 4.6 4.2   < > 4.2 CL 104 104   < > 106 CO2 32 24   < > 24 BUN 18 14   < > 15 CREATININE 0.73 0.63   < > 0.63 GLU 203* 107*   < > 134* ANIONGAP  --  11   < > 11  < > = values in this interval not displayed.  Recent Labs Lab 10/04/241215 10/06/241424 10/07/240519 CALCIUM 10.0 9.5 9.1 MG  --  1.8  --  PHOS  --  4.4  --   Recent Labs Lab 10/06/241424 ALT 24 AST 33 ALKPHOS 111 BILITOT 0.5 BILIDIR 0.2  No results for input(s): PTT, LABPROT, INR in the last 168 hours. Culture Information:No results for input(s): LABBLOO, LABURIN, LOWERRESPIRA in the last 168 hours.Imaging: Imaging results last 1 week:  US ThyroidResult Date: 10/7/20241. Bilateral heterogenous echogenicity of the thyroid gland suggests thyroiditis. Thyroid is not markedly enlarged. There is no significant hyperemia on color flow imaging. 2. Left TR3 nodule for which no follow-up is required per TIRADS criteria. [place] [facility] Notify System Classification: Trackable imaging recommendation. Report initiated by:  Ulysees Barns, MD Reported and signed by: Merdis Delay, MD  [facility] Radiology and Biomedical Imaging CTA PE (CHEST)Result Date: 11/26/2022 No evidence of pulmonary embolism or acute thoracic pathology. [place] [facility] Notify System Classification: Routine. Reported and signed by: Earl Many, MD  [facility] Radiology and Biomedical Imaging XR Chest PA and LateralResult Date: 11/26/2022 No acute cardiothoracic abnormality. [place] [facility] Notify System Classification: Routine. Report initiated by:  Dorina Hoyer, MD Reported and signed by: Shon Millet, MD  [facility] Radiology and Biomedical Imaging  Diet:  Regular dietMobility: Highest Level of mobility - ACTUAL: Mobility Level 8, Walk 250+ feet AM PAC 24  Physical Exam Discharge vitals: Temp:  [97.9 ?F (36.6 ?C)-99.8 ?F (37.7 ?C)] 98.1 ?F (36.7 ?C)Pulse:  [102-111] 106Resp:  [16-20] 20BP: (97-118)/(63-70) 118/70SpO2:  [94 %-96 %] 94 %Device (Oxygen Therapy): room air Pertinent Findings of Physical Exam: UnremarkableCognitive Status at Discharge: Baseline Alert and Oriented x 3Discharge Physical Exam:Physical ExamVitals and nursing note reviewed. Cardiovascular:    Rate and Rhythm: Tachycardia present. Rhythm irregular.    Heart sounds: Normal heart sounds. Pulmonary:    Effort: Pulmonary effort is normal.    Breath sounds: Normal breath sounds. Abdominal:    General: Bowel sounds are normal. There is no distension.    Palpations: Abdomen is soft.    Tenderness: There is no abdominal tenderness. Musculoskeletal:       General: Normal range of motion. Skin:   General: Skin is warm and dry. Neurological:    General: No focal deficit present.    Mental Status: She is alert and oriented to person, place, and time. Mental status is at baseline. Allergies No Known Allergies PMH PSH Past Medical History: Diagnosis Date  ADHD (attention deficit hyperactivity disorder)   Arrhythmia   occasional palpitation - distant past cleared by ekg  Dysthymia   GERD (gastroesophageal reflux disease)   Heart palpitations   Hypertension   Pneumonia   Reflux   Syncope   distant past - 25+ years ago  Past Surgical History: Procedure Laterality Date  ABLATION SAPHENOUS VEIN W/ RFA    LAPAROSCOPIC COLON RESECTION    partial removal of small intestine  URETHRAL SLING    Social History Family History Social History Tobacco Use  Smoking status: Never  Smokeless tobacco: Never Substance Use Topics  Alcohol use: Not Currently  Family History Problem Relation Age of Onset  Coronary Artery Disease Mother   Diabetes Mother   Pulmonary embolism Mother   Heart disease Mother       atrial fibrillation  Thrombophilia Mother   Diabetes Father   Diabetes Brother   Hypertension Brother   Obesity Brother   Sleep apnea Brother  Diabetes Brother       prediabetic Hypertension Brother   Obesity Brother   Sleep apnea Brother   Breast cancer Paternal Aunt       (p) great aunt   Colon cancer Neg Hx   Ovarian cancer Neg Hx   Uterine cancer Neg Hx   Discharge Medications: Discharge: Current Discharge Medication List  START taking these medications  Details propranoloL (INDERAL) 80 mg Immediate Release tablet Take 1 tablet (80 mg total) by mouth 3 (three) times daily.Qty: 90 tablet, Refills: 0Start date: 11/29/2022   CONTINUE these medications which have CHANGED  Details apixaban (ELIQUIS) 5 mg tablet Take 1 tablet (5 mg total) by mouth 2 (two) times daily.Qty: 60 tablet, Refills: 0Start date: 11/29/2022  methIMAzole (TAPAZOLE) 5 mg tablet Take 4 tablets (20 mg total) by mouth daily.Qty: 120 tablet, Refills: 0Start date: 11/30/2022   CONTINUE these medications which have NOT CHANGED  Details albuterol sulfate 90 mcg/actuation HFA aerosol inhaler INHALE 2 PUFFS INTO THE LUNGS EVERY 6 HOURS AS NEEDED FOR WHEEZINGQty: 8.5 g, Refills: 11  cholecalciferol, vitamin D3, 25 mcg (1,000 unit) tablet Take 1 tablet (1,000 Units total) by mouth daily.  DULoxetine (CYMBALTA) 60 mg capsule take 1 capsule by mouth dailyQty: 90 capsule, Refills: 4  fluocinolone acetonide oil (DERMOTIC) 0.01 % ear drops Place into both ears daily as needed.  ivermectin (SOOLANTRA) 1 % cream Apply 1 Application topically 2 (two) times daily.Qty: 45 g, Refills: 1  Associated Diagnoses: Rosacea  ketoconazole (NIZORAL) 2 % shampoo Apply topically twice a week.  omeprazole (PRILOSEC) 40 mg capsule take 1 capsule by mouth dailyQty: 30 capsule, Refills: 11   STOP taking these medications   dilTIAZem CD (CARDIZEM CD) 120 mg 24 hr capsule    potassium chloride (K-TAB,KLOR-CON) 10 MEQ extended release tablet    There was at total of approximately > 30 minutes spent on the discharge of this patientElectronically Signed:Zera Markwardt, APRN 11/29/2022 2:25 PMBest Contact Information: [phone]

## 2022-11-29 NOTE — Plan of Care
 Admission Note Nursing Kristie Frye is a 62 y.o. female admitted with a chief complaint of tachycardia. Patient arrived from  ED.Patient is  Axox4  Vitals:  11/28/22 0627 11/28/22 1123 11/28/22 1606 11/28/22 1917 BP: 118/88 124/78 103/65 97/63 Pulse: (!) 106 (!) 105 (!) 111 (!) 102 Resp: 18 20 16 18  Temp: 98.1 ?F (36.7 ?C) 98.4 ?F (36.9 ?C) 99.8 ?F (37.7 ?C) 97.9 ?F (36.6 ?C) TempSrc: Oral Oral Oral Oral SpO2: 94% 96% 96% 95% Weight:    118.8 kg Height:    5' 8 (1.727 m) Oxygen therapy Oxygen TherapySpO2: 95 %Device (Oxygen Therapy): room airI have reviewed the patient's current medication orders..I have reviewed patient valuables Belongings charted in last 7 days: Patient Valuables   Patient Valuables Flowsheet                    PATIENT VALUABLE(S)       Cell phone disposition At bedside/locker/closet 11/28/22 1950  Clothing Disposition At bedside/locker/closet 11/28/22 1950   Money Disposition At bedside/locker/closet 11/28/22 1950     Comments:CalmSee flowsheets, patient education and plan of care for additional information. Plan of Care Overview/ Patient Status    1907-0700hrs: Patient Axox4 VSS on RA except tachycardic. No cough. No chest pain. No SOB. Skin intact. Cont. B/B. LBM: 11/28/22. Regular diet with pills whole. Left arm 20g PIV flushed and dsg. Intact. Independent with turning and repositioning in bed and SBA OOB. Hourly rounding done. Bed alarm refused and educated on fall risk; patient said she will call for assistance. Hourly rounding done. See flowsheet for additional information. Problem: Adult Inpatient Plan of CareGoal: Plan of Care ReviewOutcome: Interventions implemented as appropriate Problem: Fall Injury RiskGoal: Absence of Fall and Fall-Related InjuryOutcome: Interventions implemented as appropriate Problem: Mobility ImpairmentGoal: Optimal MobilityOutcome: Interventions implemented as appropriate Problem: Skin Injury Risk IncreasedGoal: Skin Health and IntegrityOutcome: Interventions implemented as appropriate Problem: Adult Inpatient Plan of CareGoal: Absence of Hospital-Acquired Illness or InjuryOutcome: Interventions implemented as appropriate

## 2022-11-29 NOTE — Discharge Instructions
--  Continue taking the rest of your medications as previously prescribed. --Please bring your old and excess medications to your pharmacy for safe disposal.  NEVER throw medications in the trash or flush them down the drain. Call your doctor or return to the ED if you develop fever, chills, chest pain, shortness of breath or if you have any concerns. Follow up with your doctors as instructed. Please write down a list of all of your medications, dosages, and instructions.  Also include the names of your medical providers and any allergies you have.  Keep an updated copy of this list in your wallet or otherwise on your person at all times.  Bring a copy of this list to all of your medical appointments.Please this discharge paperwork with you to your upcoming medical appointments. It was a pleasure taking care of you.  If you have questions about your hospital stay, please call the Hospitalist Service at [phone]. Return to ED if significant palpitations/ chest pain, headache, lightheadedness/ dizziness that does not go away with rest. Monitor BP and HR with increased propranolol and record readings- bring to PCP and cardiology appointments. The endocrine team has ordered weekly thyroid function test labs for you to have drawn at [facility]- these results will be sent to their office for review.

## 2022-11-29 NOTE — Plan of Care
 Problem: Adult Inpatient Plan of CareGoal: Plan of Care ReviewOutcome: Interventions implemented as appropriateGoal: Patient-Specific Goal (Individualized)Outcome: Interventions implemented as appropriateGoal: Absence of Hospital-Acquired Illness or InjuryOutcome: Interventions implemented as appropriateGoal: Optimal Comfort and WellbeingOutcome: Interventions implemented as appropriateGoal: Readiness for Transition of CareOutcome: Interventions implemented as appropriate Problem: Mobility ImpairmentGoal: Optimal MobilityOutcome: Interventions implemented as appropriate Problem: Fall Injury RiskGoal: Absence of Fall and Fall-Related InjuryOutcome: Interventions implemented as appropriate Problem: Skin Injury Risk IncreasedGoal: Skin Health and IntegrityOutcome: Interventions implemented as appropriate Plan of Care Overview/ Patient Status

## 2022-11-30 ENCOUNTER — Encounter: Admit: 2022-11-30 | Payer: PRIVATE HEALTH INSURANCE | Attending: Vascular Surgery | Primary: Internal Medicine

## 2022-11-30 ENCOUNTER — Encounter: Admit: 2022-11-30 | Payer: PRIVATE HEALTH INSURANCE | Primary: Internal Medicine

## 2022-11-30 ENCOUNTER — Ambulatory Visit: Admit: 2022-11-30 | Payer: PRIVATE HEALTH INSURANCE | Attending: Vascular Surgery | Primary: Internal Medicine

## 2022-11-30 DIAGNOSIS — I872 Venous insufficiency (chronic) (peripheral): Secondary | ICD-10-CM

## 2022-11-30 DIAGNOSIS — K219 Gastro-esophageal reflux disease without esophagitis: Secondary | ICD-10-CM

## 2022-11-30 DIAGNOSIS — IMO0001 Reflux: Secondary | ICD-10-CM

## 2022-11-30 DIAGNOSIS — J189 Pneumonia, unspecified organism: Secondary | ICD-10-CM

## 2022-11-30 DIAGNOSIS — I10 Essential (primary) hypertension: Secondary | ICD-10-CM

## 2022-11-30 DIAGNOSIS — R600 Localized edema: Secondary | ICD-10-CM

## 2022-11-30 DIAGNOSIS — R002 Palpitations: Secondary | ICD-10-CM

## 2022-11-30 DIAGNOSIS — F341 Dysthymic disorder: Secondary | ICD-10-CM

## 2022-11-30 DIAGNOSIS — R55 Syncope and collapse: Secondary | ICD-10-CM

## 2022-11-30 DIAGNOSIS — I499 Cardiac arrhythmia, unspecified: Secondary | ICD-10-CM

## 2022-11-30 DIAGNOSIS — I890 Lymphedema, not elsewhere classified: Secondary | ICD-10-CM

## 2022-11-30 DIAGNOSIS — I75023 Atheroembolism of bilateral lower extremities: Secondary | ICD-10-CM

## 2022-11-30 DIAGNOSIS — F909 Attention-deficit hyperactivity disorder, unspecified type: Secondary | ICD-10-CM

## 2022-11-30 NOTE — Progress Notes
 [facility] Post Discharge Outreach: Transition of Care NoteRisk of Unplanned Readmission: 7.56%PERTINENT INFORMATION:   -  Full assessment completed, no concerns identified [facility] Post Discharge Outreach spoke with: PatientDischarging Hospital: [facility] Discharge Date: 11/29/2022    Discharge location: HomeHOSPITALIZATION:HyperthyroidismCURRENT STATE:Since discharge patient reports feeling: Same Patient cared for by: Family  REVIEW OF AFTER VISIT SUMMARY DOCUMENT:Patient specific questions regarding discharge: None at this timeMEDICATION CHANGES:Validated NEW medications to take: YesValidated Changed medications to take: N/AValidated Stopped medications to NOT take: YesIssues obtaining prescriptions: No FOLLOW-UP APPOINTMENTS and TRANSPORTATION:Patient aware of scheduled appointments: YesAwareness and assistance with appointments needing to be scheduled: N/Aappointments scheduledTransportation concerns for follow-up appointment: NoDME and HOME HEALTH SERVICES:Durable medical equipment received: N/AContact has been made with home care agency: N/APlan established for follow up labs/tests: N/A ADDITIONAL PATIENT NEEDS:

## 2022-12-01 ENCOUNTER — Ambulatory Visit: Admit: 2022-12-01 | Payer: PRIVATE HEALTH INSURANCE | Primary: Internal Medicine

## 2022-12-01 ENCOUNTER — Encounter: Admit: 2022-12-01 | Payer: PRIVATE HEALTH INSURANCE | Attending: Internal Medicine | Primary: Internal Medicine

## 2022-12-01 ENCOUNTER — Telehealth: Admit: 2022-12-01 | Payer: PRIVATE HEALTH INSURANCE | Attending: Internal Medicine | Primary: Internal Medicine

## 2022-12-01 MED ORDER — PROPRANOLOL IMMEDIATE RELEASE 20 MG TABLET
20 | ORAL_TABLET | Freq: Three times a day (TID) | ORAL | 12 refills | Status: SS
Start: 2022-12-01 — End: 2022-12-29

## 2022-12-02 ENCOUNTER — Encounter: Admit: 2022-12-02 | Payer: PRIVATE HEALTH INSURANCE | Primary: Internal Medicine

## 2022-12-07 ENCOUNTER — Encounter: Admit: 2022-12-07 | Payer: PRIVATE HEALTH INSURANCE | Attending: Internal Medicine | Primary: Internal Medicine

## 2022-12-11 LAB — THYROID STIMULATING IMMUNOGLOBULIN: THYROID-STIMULATING IMMUNOGLOB, S: 3.8 {TSI_index} — ABNORMAL HIGH (ref <=1.3)

## 2022-12-12 ENCOUNTER — Telehealth: Admit: 2022-12-12 | Payer: PRIVATE HEALTH INSURANCE | Attending: Cardiovascular Disease | Primary: Internal Medicine

## 2022-12-12 ENCOUNTER — Encounter: Admit: 2022-12-12 | Payer: PRIVATE HEALTH INSURANCE | Primary: Internal Medicine

## 2022-12-12 ENCOUNTER — Ambulatory Visit: Admit: 2022-12-12 | Payer: PRIVATE HEALTH INSURANCE | Attending: Cardiovascular Disease | Primary: Internal Medicine

## 2022-12-12 ENCOUNTER — Encounter: Admit: 2022-12-12 | Payer: PRIVATE HEALTH INSURANCE | Attending: Cardiovascular Disease | Primary: Internal Medicine

## 2022-12-12 ENCOUNTER — Ambulatory Visit: Admit: 2022-12-12 | Payer: PRIVATE HEALTH INSURANCE | Primary: Internal Medicine

## 2022-12-12 VITALS — BP 130/80 | HR 87 | Ht 68.0 in | Wt 263.0 lb

## 2022-12-12 DIAGNOSIS — R002 Palpitations: Secondary | ICD-10-CM

## 2022-12-12 DIAGNOSIS — F419 Anxiety disorder, unspecified: Secondary | ICD-10-CM

## 2022-12-12 DIAGNOSIS — E785 Hyperlipidemia, unspecified: Secondary | ICD-10-CM

## 2022-12-12 DIAGNOSIS — K219 Gastro-esophageal reflux disease without esophagitis: Secondary | ICD-10-CM

## 2022-12-12 DIAGNOSIS — R0602 Shortness of breath: Secondary | ICD-10-CM

## 2022-12-12 DIAGNOSIS — J189 Pneumonia, unspecified organism: Secondary | ICD-10-CM

## 2022-12-12 DIAGNOSIS — I10 Essential (primary) hypertension: Secondary | ICD-10-CM

## 2022-12-12 DIAGNOSIS — I4891 Unspecified atrial fibrillation: Principal | ICD-10-CM

## 2022-12-12 DIAGNOSIS — I499 Cardiac arrhythmia, unspecified: Secondary | ICD-10-CM

## 2022-12-12 DIAGNOSIS — IMO0001 Reflux: Secondary | ICD-10-CM

## 2022-12-12 DIAGNOSIS — F341 Dysthymic disorder: Secondary | ICD-10-CM

## 2022-12-12 DIAGNOSIS — F909 Attention-deficit hyperactivity disorder, unspecified type: Secondary | ICD-10-CM

## 2022-12-12 DIAGNOSIS — R55 Syncope and collapse: Secondary | ICD-10-CM

## 2022-12-12 NOTE — Telephone Encounter
 DCCV scheduled @ [facility] on Nov 8th, pending Labs results per Dr.LevinHe will confirm if we are proceeding

## 2022-12-12 NOTE — Progress Notes
 Cardiology Outpatient VisitReason for Visit:Referring provider: IndesHistory of Present Illness 62 YO F with recent diagnosis of atrial fibrillation and hyperthyroidism presents to establish outpatient care Was seen in hospital 11/2022 for possible symptomatic a.fib in the setting of symptomatic hyperthyroidism/thyrotoxicosis (weight loss, weakness, loose stools, hot flashes).  She had new dyspnea on exertion during the subacute symptoms preceding this hospital course.  She was seen by endocrinology while hospitalized, unsure if graves or severe thyroiditis.  She was started on methimazole.  She was started on propranolol which was titrated for goal HR<110 on average.  Echo obtained, which was low nromal LVEF 50-55%, and no significantly valvular abnormalities, and mildly dilated LAShe presents today for follow up: Reports persistent symptoms of fatigue (general), unchanged dyspnea on exertion, and occasional hot flashes. The fatigue has been ongoing for over a month, and the patient is unsure if it is related to their current medication, propranolol, though notes it preceded propronolol Rx. The shortness of breath has been noticeable for about a month and a half, particularly when climbing stairs or walking short distances.  No PND, orthopnea.  Had LE edema that has improved without diuretic therapy.  Her DOE is unchanged.  The patient has been experiencing episodes of AFib, as detected by her watch, occurring three to four times a day. However, she does not report any associated symptoms during these alerted episodes. The patient's heart rate has been fluctuating, with an average rate of around 110-120 beats per minute.The patient has been taking Eliquis without any issues and is currently on propranolol (100mg  TID).  The patient also reports occasional hot flashes and shaking, although these symptoms have been minimal since their recent hospital stay.Social: non-smoker, no EtOH 3 years.  APRN in psychology Family: mother with CAD/afib Past Medical History:Past Medical History: Diagnosis Date  ADHD (attention deficit hyperactivity disorder)   Arrhythmia   occasional palpitation - distant past cleared by ekg  Dysthymia   GERD (gastroesophageal reflux disease)   Heart palpitations   Hypertension   Pneumonia   Reflux   Syncope   distant past - 25+ years ago  Past Surgical History:Past Surgical History: Procedure Laterality Date  ABLATION SAPHENOUS VEIN W/ RFA    LAPAROSCOPIC COLON RESECTION    partial removal of small intestine  URETHRAL SLING    Family Medical History: Social History: No Known AllergiesCurrent Outpatient Medications on File Prior to Visit Medication Sig Dispense Refill  albuterol sulfate 90 mcg/actuation HFA aerosol inhaler INHALE 2 PUFFS INTO THE LUNGS EVERY 6 HOURS AS NEEDED FOR WHEEZING 8.5 g 11  apixaban (ELIQUIS) 5 mg tablet Take 1 tablet (5 mg total) by mouth 2 (two) times daily. 60 tablet 0  cholecalciferol, vitamin D3, 25 mcg (1,000 unit) tablet Take 1 tablet (1,000 Units total) by mouth daily.    DULoxetine (CYMBALTA) 60 mg capsule take 1 capsule by mouth daily 90 capsule 4  fluocinolone acetonide oil (DERMOTIC) 0.01 % ear drops Place into both ears daily as needed.    ivermectin (SOOLANTRA) 1 % cream Apply 1 Application topically 2 (two) times daily. 45 g 1  ketoconazole (NIZORAL) 2 % shampoo Apply topically twice a week.    methIMAzole (TAPAZOLE) 5 mg tablet Take 4 tablets (20 mg total) by mouth daily. 120 tablet 0  omeprazole (PRILOSEC) 40 mg capsule take 1 capsule by mouth daily 30 capsule 11  propranoloL (INDERAL) 20 mg Immediate Release tablet Take 1 tablet (20 mg total) by mouth 3 (three) times daily. 90 tablet 11  propranoloL (INDERAL) 80 mg Immediate Release tablet Take 1 tablet (80 mg total) by mouth 3 (three) times daily. 90 tablet 0 No current facility-administered medications on file prior to visit. Review of Systems ROS 10 point ROS negative except for what is stated in HPI Vital Signs and Physical Exam Vitals:  12/12/22 1203 BP: 130/80 Site: Right Arm Position: Sitting Cuff Size: Large Pulse: 87 SpO2: 99% Weight: 119.3 kg Height: 5' 8 (1.727 m) Physical Exam Constitutional: She appears healthy. Neck: No JVD present. Cardiovascular: Normal rate, S1 normal, S2 normal and normal heart sounds. An irregular rhythm present. No murmur heard.Pulmonary/Chest: Breath sounds normal. Musculoskeletal:       General: Edema present.    Comments: 2+ pitting edema to her shins Neurological: She is alert and oriented to person, place, and time. Laboratory Studies Creatinine Date/Time Value Ref Range Status 11/27/2022 05:19 AM 0.63 0.40 - 1.30 mg/dL Final 13/24/4010 27:25 PM 0.63 0.40 - 1.30 mg/dL Final Platelets Date/Time Value Ref Range Status 11/27/2022 05:19 AM 191 150 - 420 x1000/?L Final Cholesterol, Total Date/Time Value Ref Range Status 01/20/2022 10:04 AM 189 <200 mg/dL Final Triglycerides Date/Time Value Ref Range Status 01/20/2022 10:04 AM 126 <150 mg/dL Final HDL Date/Time Value Ref Range Status 01/20/2022 10:04 AM 51 > OR = 50 mg/dL Final LDL Cholesterol Date/Time Value Ref Range Status 01/20/2022 10:04 AM 114 (H) mg/dL (calc) Final   Comment:   Reference range: <100 Desirable range <100 mg/dL for primary prevention;  <70 mg/dL for patients with CHD or diabetic patients with > or = 2 CHD risk factors. LDL-C is now calculated using the Martin-Hopkins calculation, which is a validated novel method providing better accuracy than the Friedewald equation in the estimation of LDL-C. Horald Pollen et al. Lenox Ahr. 3664;403(47): 2061-2068 (http://education.[employer].com/faq/FAQ164) INR Date/Time Value Ref Range Status 05/10/2010 10:23 AM 0.88 0.80 - 1.15 Final Lab Results Component Value Date  HGBA1C 6.4 09/15/2022  HGBA1C 6.1 (H) 01/20/2022  HGBA1C 6.1 01/21/2021  HGBA1C 6.2 11/04/2020  HGBA1C 5.5 02/13/2019  HGBA1C 5.7 08/26/2013  Diagnostic Studies EKG:  Rate controlled atrial fibrillation and nonspecific T-wave abnormalitiesNon-Cardiac Diagnostic Studies:Cardiac Diagnostic Studies:Transthoracic Echocardiogram:Results for orders placed or performed during the hospital encounter of 11/26/22 Echo 2D Complete w Doppler and CFI if Ind Image Enhancement 3D and or bubbles Result Value Ref Range  Reported Visual Range EF% 50-55 %  Narrative   * Technically limited study.* Normal left ventricular cavity size.  Mild concentric left ventricular hypertrophy.  No regional wall motion abnormalities.  Low normal left ventricular systolic function.  LVEF estimated by visual assessment was between 50-55%.  Diastolic function was difficult to determine due to atrial fibrillation.* Normal right ventricular cavity size and systolic function.  Estimated right ventricular systolic pressure is 18 mmHg.* Left atrium is mildly dilated.  No interatrial shunt by color Doppler.  Right atrium is normal in size.* No significant valvular abnormalities.* No significant pericardial effusion.* No prior study available for comparison. Stress Test:No results found for this or any previous visit.Coronary angiography: Impression and Recommendations 62 YO F with recent diagnosis of atrial fibrillation and hyperthyroidism presents to establish outpatient care She has ongoing (likely) symptomatic a.fib (DOE, fatigue) in setting of recent diagnosis of thyrotoxicosis.  Given ongoing symptoms of DOE, fatigue likely attributable to a.fib, would like to restore NSR as soon as possible with DCCV.  However, will await to proceed until thyroid function improving (her other clinical features of hyperthyroid are improving), as high risk of return of a.fib if remains  significantly hyperthyroid.  Fortunately, her symptoms are stable, she has normal LV function on recent echocardiogram, and she is currently reasonably rate controlled, so can wait. Given symptoms to reach out for that should facilitate this process.  I have placed thyroid labs and messaged her PCP and future endocrinologist to see if additional labs would be helpful, in hopes to facilitate DCCV.  In meantime, will cont rate control. Will reduce rate control (propranolol TID to BID) once closer to DCCV.  Obtain 24 monitor, if average rates significantly elevated, may opt for earlier attempt at DCCV.  She has been on therapeutic AC for 3 weeks without missed dose.  She is a CHADS2VASc 1. R/B discussed re: AC. She would prefer [facility] for now. She may not need lifelong AC as this is provoked a.fib in setting of hyperthyroidism. In future, with normal thyroid function, if NSR is restored, an extended monitor can be obtained to assess long term need for [facility]. Work up of stable DOE can be expanded once NSR is maintained-- if still present, can consider HFpEF (has LE edema, though significantly improvedA.fib: rate controlled- 24 hour monitor - follow up labs- DCCV pending above information- cont eliquis for now uniterrupted given upcoming plan for DCCV- cont propranolol 100 TID, reduce to BID when closer to DCCV - once euthyroid, plan for 1 month monitor to assess need for long-term AC - consider sleep study if fatigue persists, and/or a.fib recurs when euthyroid - weight loss recommendedLE edema: improved significantly without diuretic therapy, no other HF symptoms (aside DOE which is likely a.fib related)- keep legs elevated, cont compression stockings, f/u vascular studiesHLD: LDL 120s, with family history of CAD.  Will address this once acute issues resolveFollow-up in 4 monthsOn the day of the patient's encounter, a total of 47 minutes was personally spent by me. This does not include any resident/fellow teaching time, or any time spent performing a procedural service. Lorene Dy, MD Assistant Professor of MedicineSection of Cardiology [facility]10/22/2024

## 2022-12-15 ENCOUNTER — Encounter: Admit: 2022-12-15 | Payer: PRIVATE HEALTH INSURANCE | Attending: Internal Medicine | Primary: Internal Medicine

## 2022-12-21 LAB — T4, FREE: FREE T4: 1 ng/dL (ref 0.8–1.8)

## 2022-12-21 LAB — T3: T3 TOTAL: 128 ng/dL (ref 76–181)

## 2022-12-21 LAB — TSH: TSH, 3RD GENERATION: 0.01 m[IU]/L — ABNORMAL LOW (ref 0.40–4.50)

## 2022-12-21 NOTE — Other
 TSH still suppressed, T3/T4 ok.  Will proceed with DCCV.  Sent mychart message

## 2022-12-22 NOTE — Telephone Encounter
 Per Dr.LevinWill proceed with DCCV on 11/8

## 2022-12-23 ENCOUNTER — Encounter: Admit: 2022-12-23 | Payer: PRIVATE HEALTH INSURANCE | Primary: Internal Medicine

## 2022-12-25 ENCOUNTER — Telehealth: Admit: 2022-12-25 | Payer: PRIVATE HEALTH INSURANCE | Attending: Internal Medicine | Primary: Internal Medicine

## 2022-12-25 ENCOUNTER — Telehealth: Admit: 2022-12-25 | Payer: PRIVATE HEALTH INSURANCE | Attending: "Endocrinology | Primary: Internal Medicine

## 2022-12-25 NOTE — Telephone Encounter
 PT has cardioversion scheduled for this Friday 11/8, requesting Dr. Arbutus Ped connect with Dr. Lorene Dy regarding patients dose for her methimazole leading up to the procedureOffce phone # [phone].

## 2022-12-26 ENCOUNTER — Encounter: Admit: 2022-12-26 | Payer: PRIVATE HEALTH INSURANCE | Attending: Cardiovascular Disease | Primary: Internal Medicine

## 2022-12-26 NOTE — Telephone Encounter
 We saw this patient in the hospital for overactive thyroid disease/ Graves. Recent labs are within normal range. Cardioversion is scheduled for this Friday. Dr. Remus Loffler was in touch with me.We do need to see the patient to start re-adjusting methimazole dosing. I can offer 11.15 tomorrow - will work with my clinic schedule. Alternatively, I can offer next Wednesday at 11.15, although this week is better as I have less patients scheduled. I will prefer face to face visit, but if not possible, telemedicine is ok. Hope tomorrow or next Wednesday will work. She just did labs on 10/30, so no need to do labs before the visit, I have the results. Once you know what day / time works for her, please, let the secretaries know so they schedule the visit. THANK YOU!!!  Kristie Frye

## 2022-12-26 NOTE — Telephone Encounter
 For Dr. [name]: Her recent TFTs are in euthyroid range. In general, we prefer waiting a bit longer before cardioversion but I will defer to you. I will re-schedule the visit and see her to re-adjust the methimazole dosing this / next week. I tried calling the office number today and left a message. Please call me if need to discuss her. Kristie Frye

## 2022-12-27 ENCOUNTER — Ambulatory Visit: Admit: 2022-12-27 | Payer: PRIVATE HEALTH INSURANCE | Attending: "Endocrinology | Primary: Internal Medicine

## 2022-12-27 ENCOUNTER — Encounter: Admit: 2022-12-27 | Payer: PRIVATE HEALTH INSURANCE | Attending: "Endocrinology | Primary: Internal Medicine

## 2022-12-27 ENCOUNTER — Telehealth: Admit: 2022-12-27 | Payer: PRIVATE HEALTH INSURANCE | Attending: "Endocrinology | Primary: Internal Medicine

## 2022-12-27 VITALS — Ht 69.6 in | Wt 268.0 lb

## 2022-12-27 DIAGNOSIS — R002 Palpitations: Secondary | ICD-10-CM

## 2022-12-27 DIAGNOSIS — IMO0001 Reflux: Secondary | ICD-10-CM

## 2022-12-27 DIAGNOSIS — R5383 Other fatigue: Secondary | ICD-10-CM

## 2022-12-27 DIAGNOSIS — I499 Cardiac arrhythmia, unspecified: Secondary | ICD-10-CM

## 2022-12-27 DIAGNOSIS — K219 Gastro-esophageal reflux disease without esophagitis: Secondary | ICD-10-CM

## 2022-12-27 DIAGNOSIS — I4891 Unspecified atrial fibrillation: Secondary | ICD-10-CM

## 2022-12-27 DIAGNOSIS — J189 Pneumonia, unspecified organism: Secondary | ICD-10-CM

## 2022-12-27 DIAGNOSIS — R55 Syncope and collapse: Secondary | ICD-10-CM

## 2022-12-27 DIAGNOSIS — I10 Essential (primary) hypertension: Secondary | ICD-10-CM

## 2022-12-27 DIAGNOSIS — F341 Dysthymic disorder: Secondary | ICD-10-CM

## 2022-12-27 DIAGNOSIS — E0500 Thyrotoxicosis with diffuse goiter without thyrotoxic crisis or storm: Principal | ICD-10-CM

## 2022-12-27 DIAGNOSIS — F909 Attention-deficit hyperactivity disorder, unspecified type: Secondary | ICD-10-CM

## 2022-12-27 MED ORDER — METHIMAZOLE 5 MG TABLET
5 | ORAL_TABLET | Freq: Every day | ORAL | 3 refills | Status: AC
Start: 2022-12-27 — End: ?

## 2022-12-27 NOTE — Other
 Avg HR 102, acceptable, planning for DCCV soon

## 2022-12-27 NOTE — Patient Instructions
 Recent thyroid labs - in the normal range. TSH is low, but yes, lags behind ALWAYS. Decrease methimazole to 10 mg DAILYRepeat labs MONTHLY. Follow up in 3 months with re-adjustments of the dose MONTHLY for now. Telemedicine visit is probably ok

## 2022-12-27 NOTE — Progress Notes
 Spoke with endo.  Labs now euthyroid.  At risk of recurrence of a.fib as thyroid function may fluctuate over the next few months.  Discussed this with patient. Her prefernce is to proceed given her significant symptoms presumed to be related to a.fib (dyspnea, fatigue).  I think this is reasonable given euthyroid labs for now. Should DCCV fail or recurrence occur, can trial anti-arrhythmicsWill cont propranolol, change to BID tomorrow, then daily the day before and of the DCCV, if restore NSR, plan to resume propranolol BID

## 2022-12-27 NOTE — Progress Notes
 [place] [facility] NOTEProvider: Cooper Render, MDDate of service: 11/6/2024Name: Kristie Frye (DOB: 03/11/1960)AGE: 62 y.o. PCP: Enid Skeens of initial evaluation: 11/26/2022 in hospital settingREASON FOR REFERRAL/ FOLLOW UP: thyrotoxicosis in setting of new Afib/ Sinus tachycardiaDiana E Frye is a 62 year old female with past medical history significant for ADHD, atrial fibrillation, arrhythmia, dysthymia, GERD, heart palpitations, hypertension, pneumonia, and syncope. INTERIM HISTORY:- 12/29/2022 - scheduled for cardioversion. Contacted by Dr. Remus Loffler to review recent TFTs and assure methimazole dose re-adjusted correctly- Methimazole Rx 20 mg daily since 11/26/2022.- 12/20/2022 - TFTs in euthyroid range. TSH remains low at 0.01. Remarkable improvement- 11/2022 - Thyroid US: L 1 cm thyroid nodule, TR3.- Today, she notes significant fatigue, especially muscle fatigue and legs weakness. She adds legs feel weak when trying to walk up stairs.- Today, no cardiac symptoms- She states this fatigue was present for at least a month before starting methimazole.- Taking MVT daily- Propranolol dose decreased to 100 mg twice daily by Dr. Suzan Slick TREATMENT: methimazole 20 mg daily.Date TSH Free T4 Total T3 TSI  TPO Ab TRAB Methimazole 11/24/2022 0.02 2.9      11/26/2022   376 3.8 86 5.39  11/28/2022 <0.005 2.69     20 mg daily 12/20/2022 0.01 1.0 128    20 mg daily 11/27/2022 - thyroid ultrasound: Isthmus: Measure 0.2 cm. No focal lesions.Right thyroid lobe: 5.2 x 1.5 x 1.7 cm.Heterogenous.Left thyroid lobe: 4.8 x 1.7 x 1.4 cm.Echotexture: Heterogenous.Vascularity on color Doppler: Normal.LEFT: A 1.0 x 0.8 x 0.9 cm superior lobe nodule is solid, hyperechoic, not taller than wide, with smooth borders. TR 3. Recommendation: IMPRESSION:1. Heterogenous echogenicity of the thyroid gland suggests thyroiditis. Thyroid is not markedly enlarged. No significant hyperemia on color flow imaging.2. Left TR3 nodule for which no follow-up is required per TIRADS criteria. HISTORY OF PRESENT ILLNESS ( 11/2022): - 11/26/2022 - Patient presented to ED with HR up to 140s with palpitations, BP was stable. TSH was < 0.0005, T4 was 2.69. New diagnosis of moderate thyrotoxicosis in a patient with newly Afib/ Sinus tachycardia, LE swelling.- TSH not completely suppressed, but very low:    Lab Results Component Value Date   TSH 4.020 05/03/2017   TSH, 3RD GENERATION W/ REFLEX TO FT4 0.02 (L) 11/24/2022   FREE T4 2.9 (H) 11/24/2022   T3 TOTAL 376.0 (H) 11/26/2022   THYROID PEROXIDASE AB 86 (H) 11/26/2022 - 11/27/2022 - thyroid US showed b/l heterogenous echogenicity of the thyroid gland suggesting thyroiditis. Thyroid is not markedly enlarged. There is no significant hyperemia on color flow imaging.- Labs reviewed. Liver panel - WNL. CBC - WBC of 3.6. - Recommendations on discharge:1. Due to Afib and moderate degree of free T4 / Total T3 elevation, start methimazole 20 mg daily.2. Monitor TFTs weekly as still concerned about possibility of thyroiditis as an etiology of thyrotoxicosis. Methimazole 	titration per TFTs.3. Afib Rx per cardiology. Very likely in the setting of thyrotoxicosis. Best Rx - beta blockers, doses are typically are high to get HR controlled. 4. Follow up at [facility] in 4-6 weeks.-11/30/2022 - evaluation by cardiologist Dr. Remus Loffler for hospital d/c followup for Afib. Recommendations for Holter monitor, f/u labs, and DCCV pending results. Continue Eliquis and propranolol. Once euthyroid, plan for monitor to assess for long term Afib control. RTC 4 months.CURRENT TREATMENT: methimazole 20 mg daily.Date TSH Free T4 Total T3 TSI  TPO Ab TRAB Methimazole 11/24/2022 0.02 2.9      11/26/2022   376 3.8 86 5.39  11/28/2022 <0.005 2.69  20 mg daily 12/20/2022 0.01 1.0 128    20 mg daily - 11/27/2022 - thyroid ultrasound: Isthmus: Measure 0.2 cm. No focal lesions.RIGHT:Right thyroid lobe: 5.2 x 1.5 x 1.7 cm.Echotexture: Heterogenous.Nodules/lesions: None.LEFT:Left thyroid lobe: 4.8 x 1.7 x 1.4 cm.Echotexture: Heterogenous.Nodules/lesions: A 1.0 x 0.8 x 0.9 cm superior lobe nodule is solid, hyperechoic, not taller than wide, with smooth borders, and no echogenic foci were calcifications. Total points: 3. TI-RADS level: TR 3. Recommendation: Vascularity on color Doppler: Normal. IMPRESSION:1. Bilateral heterogenous echogenicity of the thyroid gland suggests thyroiditis. Thyroid is not markedly enlarged. There is no significant hyperemia on color flow imaging.2. Left TR3 nodule for which no follow-up is required per TIRADS criteria.  PAST MEDICAL HISTORY: Past Medical History: Diagnosis Date  ADHD (attention deficit hyperactivity disorder)   Arrhythmia   occasional palpitation - distant past cleared by ekg  Dysthymia   GERD (gastroesophageal reflux disease)   Heart palpitations   Hypertension   Pneumonia   Reflux   Syncope   distant past - 25+ years ago PAST SURGICAL HISTORY: She has a past surgical history that includes Ablation saphenous vein w/ RFA; Urethral sling; and Laparoscopic colon resection.ALLERGIES: Patient has no known allergies.FAMILY HISTORY:  Negative for endocrine diseases. Specifically, no thyroid, parathyroid, adrenal, pituitary or other neuroendocrine diseases or neoplasms.SOCIAL HISTORY: The patient does not smoke or drink excess alcohol and denies any toxic habits or use of illicit substances. Currently she is a Biomedical engineer. There are no pertinent occupational exposures. There is no history of excess radiation exposure to thyroid region or sella.  MEDICATIONS:Current Medications Medication Sig  apixaban (ELIQUIS) 5 mg tablet Take 1 tablet (5 mg total) by mouth 2 (two) times daily.  cholecalciferol, vitamin D3, 25 mcg (1,000 unit) tablet Take 1 tablet (1,000 Units total) by mouth daily.  DULoxetine (CYMBALTA) 60 mg capsule take 1 capsule by mouth daily  methIMAzole (TAPAZOLE) 5 mg tablet Take 4 tablets (20 mg total) by mouth daily.  omeprazole (PRILOSEC) 40 mg capsule take 1 capsule by mouth daily  propranoloL (INDERAL) 20 mg Immediate Release tablet Take 1 tablet (20 mg total) by mouth 3 (three) times daily.  propranoloL (INDERAL) 80 mg Immediate Release tablet Take 1 tablet (80 mg total) by mouth 3 (three) times daily. PHYSICAL EXAMINATION: Ht 5' 9.6 (1.768 m)  - Wt 121.6 kg  - LMP 02/02/2013 (Exact Date)  - BMI 38.90 kg/m? Not done as the visit was conducted through telemedicine. LABORATORY DATA:  Laboratory data reviewed and discussed with the patient.  Pertinent dictated in the History of Present Illness.ASSESSMENT AND PLAN:   1.	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  Thyroid not enlarged. No graves opthalmopathy.  2024- TPO Abs / TRAB / TSI elevated.11/2022 - Thyroid US: Bilateral heterogenous echogenicity. No significant hyperemia on color flow imaging.Most likely dealing with Graves disease.  2. 	Thyrotoxicosis/Graves Disease Rx: 11/26/2022 - Due to Afib and moderate degree of free T4 / Total T3 elevation, methimazole 20 mg daily was started 12/20/2022 - TFTs in euthyroid range. TSH low at 0.01, byt typically lags behind. Remarkable improvement. Decrease methimazole down to 10 mg daily.Monitor TFTs in 2 weeks and 4 weeks to assure TSH at goal and to readjust methimazole dose as needed.Monitor TSI/TRAB levels every 3 months. Medical treatment with methimazole will be continued for at least 18-24 months.Notifications for RAIT. No indications for surgical treatment. 3. 	Afib. Fatigue. Muscle weakness.12/20/2022 - TFTs in euthyroid range. Remarkable improvement.Symptoms of fatigue and muscle weakness possibly and likely due  to drastic change in hormonal thyroid levels with methimazole Rx..Agree with decrease in beta blockers dose as TFTs are in euthyroid range.As TFTs in euthyroid range, other agents than beta blockers can be used for Afib/ HTN Rx. 11/28/2022 - cardioversion scheduled by Dr. Remus Loffler. Rx of Afib per Dr. Remus Loffler. 4.	Followup in 2-3 months via telemedicine. Patient and Rx plan discussed with Dr. Remus Loffler on the phone.  If there are positive findings in the ROS the patient has been referred back to PCP.  The patient verbalized understanding of our recommendations regarding further evaluation and treatment.  The importance of taking all medications as prescribed was emphasized as was the need to attend all recommended future imaging studies, laboratory testing, referrals and followup visits. Total time spent by the provider on the day of service, which includes time spent on pre-charting and chart review, in-person interaction with the patient, education, coordination of care/services and counseling on the diagnosis, recommendations and plan of care, also, dictation/ editing the consultative note, medications refill, laboratory / diagnostic tests orders was 40  min. VIDEO TELEHEALTH VISIT: This clinician is part of the telehealth program and is conducting this visit in a currently approved location. For this visit the clinician and patient were present via interactive audio & video telecommunications system that permits real-time communications, via the [employer].Patient's use of the telehealth platform followed consent and acknowledges agreement to permit telehealth for this visit. State patient is located in: [place] clinician is appropriately licensed in the above state to provide care for this visit. Other individuals present during the telehealth encounter and their role/relation: noneBecause this visit was completed over video, a hands-on physical exam was not performed. Patient/parent or guardian understands and knows to call back if condition changes. Cooper Render, M.D.Assistant Professor of Clinical Medicine, [employer] of MedicineDepartment of Internal Medicine, Section of Endocrinology Scribed for Jessy Oto, MD by Josephina Shih, medical scribe November 6, 2024The documentation recorded by the scribe accurately reflects the services I personally performed and the decisions made by me. I reviewed and confirmed all material entered and/or pre-charted by the scribe.

## 2022-12-27 NOTE — Telephone Encounter
 Called patient after missing Video appointment scheduled for today @ 11:15 AM. She reports having connection issues to continue [facility]. Hep desk phone # provideed. Advised Dr Arbutus Ped will see her via TH today I the next 15 minutes if she can connect- she understood. If still unable, as per Dr Arbutus Ped, she can be added to [facility] next Wednesday @ 12noon. Patient understood. Electronically Signed by Jeanell Sparrow, RN, December 27, 2022

## 2022-12-29 ENCOUNTER — Ambulatory Visit: Admit: 2022-12-29 | Payer: PRIVATE HEALTH INSURANCE | Attending: Anesthesiology | Primary: Internal Medicine

## 2022-12-29 ENCOUNTER — Inpatient Hospital Stay: Admit: 2022-12-29 | Discharge: 2022-12-29 | Payer: PRIVATE HEALTH INSURANCE | Primary: Internal Medicine

## 2022-12-29 ENCOUNTER — Encounter: Admit: 2022-12-29 | Payer: PRIVATE HEALTH INSURANCE | Attending: Diagnostic Radiology | Primary: Internal Medicine

## 2022-12-29 DIAGNOSIS — I4891 Unspecified atrial fibrillation: Secondary | ICD-10-CM

## 2022-12-29 DIAGNOSIS — I499 Cardiac arrhythmia, unspecified: Secondary | ICD-10-CM

## 2022-12-29 DIAGNOSIS — E785 Hyperlipidemia, unspecified: Secondary | ICD-10-CM

## 2022-12-29 DIAGNOSIS — Z6838 Body mass index (BMI) 38.0-38.9, adult: Secondary | ICD-10-CM

## 2022-12-29 DIAGNOSIS — R002 Palpitations: Secondary | ICD-10-CM

## 2022-12-29 DIAGNOSIS — Z7901 Long term (current) use of anticoagulants: Secondary | ICD-10-CM

## 2022-12-29 DIAGNOSIS — I10 Essential (primary) hypertension: Secondary | ICD-10-CM

## 2022-12-29 DIAGNOSIS — R55 Syncope and collapse: Secondary | ICD-10-CM

## 2022-12-29 DIAGNOSIS — J189 Pneumonia, unspecified organism: Secondary | ICD-10-CM

## 2022-12-29 DIAGNOSIS — K219 Gastro-esophageal reflux disease without esophagitis: Secondary | ICD-10-CM

## 2022-12-29 DIAGNOSIS — Z79899 Other long term (current) drug therapy: Secondary | ICD-10-CM

## 2022-12-29 DIAGNOSIS — IMO0001 Reflux: Secondary | ICD-10-CM

## 2022-12-29 DIAGNOSIS — F341 Dysthymic disorder: Secondary | ICD-10-CM

## 2022-12-29 DIAGNOSIS — F909 Attention-deficit hyperactivity disorder, unspecified type: Secondary | ICD-10-CM

## 2022-12-29 MED ORDER — PROPRANOLOL IMMEDIATE RELEASE 80 MG TABLET
80 | ORAL_TABLET | Freq: Two times a day (BID) | ORAL | 1 refills | Status: AC
Start: 2022-12-29 — End: 2023-01-02

## 2022-12-29 MED ORDER — SODIUM CHLORIDE 0.9 % (FLUSH) INJECTION SYRINGE
0.9 % | Freq: Three times a day (TID) | INTRAVENOUS | Status: DC
Start: 2022-12-29 — End: 2022-12-29
  Administered 2022-12-29: 16:00:00 0.9 mL via INTRAVENOUS

## 2022-12-29 MED ORDER — HYDROCORTISONE 1 % TOPICAL CREAM
1 % | Freq: Two times a day (BID) | TOPICAL | Status: DC | PRN
Start: 2022-12-29 — End: 2022-12-29

## 2022-12-29 MED ORDER — SODIUM CHLORIDE 0.9 % (FLUSH) INJECTION SYRINGE
0.9 % | INTRAVENOUS | Status: DC | PRN
Start: 2022-12-29 — End: 2022-12-29

## 2022-12-29 MED ORDER — PROPOFOL 10 MG/ML INTRAVENOUS EMULSION
10 | INTRAVENOUS | Status: DC | PRN
Start: 2022-12-29 — End: 2022-12-29
  Administered 2022-12-29 (×2): 10 mg/mL via INTRAVENOUS

## 2022-12-29 MED ORDER — PROPRANOLOL IMMEDIATE RELEASE 20 MG TABLET
20 | ORAL_TABLET | Freq: Two times a day (BID) | ORAL | 12 refills | Status: AC
Start: 2022-12-29 — End: 2023-01-02

## 2022-12-29 NOTE — Discharge Instructions
 No driving today.You have just received electricity to reset your heart back to a normal rhythm.In the event you experience a burn to the chest or back area you can apply hydrocortisone cream 1% over the counter to the area twice a day x3-5 days or until clear.Any questions please call your physician.

## 2022-12-29 NOTE — Anesthesia Procedure Notes
 History & Physical Attestation and Pre Procedure Assessment for Moderate Sedation AssessmentHistory & Physical exam: Prior to this procedure I have completed (or reviewed and attested to) a History and Physical Exam written within the last 30 days, which is in the patient record. The patient has been assessed and examined prior to this procedure and no changes were noted unless documented in the following notes.Notes: n/aPhysician Airway Assessment: Dept of Anesthesia is involved in this procedure and will be responsible for moderate sedation and/or anesthesia during this procedure.Procedural Sedation Risk Assessment:Additional risks, if any, related to procedural sedation are noted here: Electronically Signed by Astrid Divine, APRN, December 29, 2022

## 2022-12-29 NOTE — Anesthesia Post-Procedure Evaluation
 Anesthesia Post-op NotePatient: Kristie E JohnsonProcedure(s):  Procedure(s) (LRB):CARDIOVERSION (N/A) Last Vitals:  I have reviewed the post-operative vital signs as noted in the Epic chart.POSTOP EVALUATION:      Patient Recovery Location:  Phase II     Vital Signs Status:  Stable     Patient Participation:  Patient participated     Mental Status:  Awake, alert and oriented     Respiratory Status:  Acceptable     Airway Patency:  Patent     Cardiovascular/Hydration Status:  Stable     Pain Management:  Satisfactory to patient     Nausea/Vomiting Status:  Satisfactory to patientThere were no known notable events for this encounter.

## 2022-12-29 NOTE — H&P
 61 YO F with recent diagnosis of atrial fibrillation and hyperthyroidism presents for elective DCCV now that she is euthyroid.  Currently on propanol daily post DCCV will increase to BID per Dr Orion Modest note.  Has been on eliquis no missed doses.Pre-Procedural Assessment for Electrophysiology Study with Moderate Sedation Physician Airway Assessment:Dept of Anesthesia is involved in this procedure and will be responsible for moderate sedation and/or anesthesia during this procedure.History & Physical Attestation: Prior to this procedure I have completed (or reviewed and attested to) a History and Physical Exam written within the last 30 days, which is in the patient record. The patient has been assessed and examined prior to this procedure and no changes were noted unless otherwise documented here: None.Electronically Signed by Astrid Divine, APRN, December 29, 2022

## 2022-12-29 NOTE — Other
 Post Anesthesia Transfer of Care NotePatient: Kristie Mcguigan JohnsonProcedure(s) Performed: Procedure(s) (LRB):CARDIOVERSION (N/A)Last Vitals: I have reviewed the post-operative vital signs during the handoff as noted in the Epic chart.POSTOP HANDOFF :      Patient Location:  Phase II     [place]rete Assessment (score of 8 or greater qualifies pt for phase II location):         Activity:  2 - able to move 4 extremities voluntarily or on command        Respiratory:  2 - able to breathe deeply and cough freely        Circulatory:  2 - blood pressure +/- 20% of pre-anesthetic level        Consciousness:  2 - fully awake        O2 Saturation:  2 - able to maintain oxygen saturation > 92% on room air             TOTAL:  10     Level of Consciousness:  Awake, alert and oriented     VS stable since last recorded intra-op set? Yes       Oxygen source: room airPatient co-morbidities, intra-operative course, intake & output and antibiotics as per Anesthesia record were discussed with the RN.

## 2022-12-29 NOTE — Brief Op Note
 Electrical Cardioversion Procedure NoteProcedure Date: 11/8/2024Pre-Operative Diagnosis: afibPost-Operative Diagnosis: SRProcedure:	Clinical cytogeneticist:	Astrid Divine NPAnesthesia:	PropofolDrains:	NoneEstimated Blood Loss:  MinimalComplications:	NoneIndications: afibSpecimen removed:  NoneAdditional Studies ordered:  NoneDrains:  None Time out was performed prior to procedure.Procedure:Informed consent was obtained.  The patient was brought to the EP lab in the fasting state.  She received sedation via the anesthesia service. When she had been adequately sedated, she was cardioverted with a single 360J biphasic shock.  This restored sinus rhythm.  The patient awoke spontaneously, and was returned to her room in good condition. Astrid Divine APRNEP [employer]

## 2022-12-29 NOTE — Other
 Operative Diagnosis:Pre-op:   afib Patient Coded Diagnosis   Pre-op diagnosis: Atrial fibrillation, unspecified type (HC Code) (HC CODE) (HC Code)  Post-op diagnosis: Atrial fibrillation, unspecified type (HC Code) (HC CODE) (HC Code)  Patient Diagnosis   Pre-op diagnosis: afib  Post-op diagnosis:     Post-op diagnosis:   * Atrial fibrillation, unspecified type (HC Code) (HC CODE) (HC Code) [I48.91]Operative Procedure(s) :Procedure(s) (LRB):CARDIOVERSION (N/A)Post-op Procedure & Diagnosis ConfirmationPost-op Diagnosis: Post-op Diagnosis updated (see notes)     -  Atrial fibrillation, unspecified type (HC Code) [I48.91]Post-op Procedure: Post-op Procedure updated (see notes)     - : [facility] CARDIOVERSION / [facility] CARDIAC CATH/EP

## 2022-12-29 NOTE — Anesthesia Pre-Procedure Evaluation
 This is a 62 y.o. female scheduled for CARDIOVERSION.Review of Systems/ Medical History Patient summary, nursing notes, EKG/Cardiac Studies , Labs, pre-procedure vitals, height, weight and NPO status reviewed.No previous anesthesia concernsAnesthesia Evaluation:   Estimated body mass index.12/27/22 : 38.90 kg/m? Last patient weight recorded. 12/27/22 : 121.6 kg Last patient height recorded. 12/27/22 : 5' 9.6 (1.768 m) CC/HPI: 82F for Cardioversion for AFibDysthymiaPalpitations, syncopePneumoniaADHDEliquis 11/8Cymbalta 11/7Tapazole 11/7Prilosec 11/7Past Surgical History:  ABLATION SAPHENOUS VEIN W/ RFA	URETHRAL SLINGLAPAROSCOPIC COLON RESECTION	Cardiovascular: Patient has a history of: hypertension, palpitations and syncope.  No angina.  -Coronary Artery Disease:  No history of MI -Dysrhythmia(s): yes-Vascular Disease:  Negative   peripheral venous disease (varicose veins).   Respiratory:  Negative. Patient has shortness of breath.     -Obstructive sleep apnea:  Patient does not have obstructive sleep apnea syndrome.     -Nicotine Dependence: no-Airway disorders:      -Asthma: no-Lung Disorders:      -COPD: noHEENT: Negative.Neuromuscular: Negative-Intracranial disorders:  She did not have a cerebrovascular accident    Patient has no history of TIASkeletal/Skin:  NegativeGastrointestinal/Genitourinary: -Gastrointestinal Disorders:  Patient has colon polyps. Patient has GERD. Her GERD is well controlled.-Nutritional Disorders: Pt is obese per BMI definition-morbid obesity (BMI > 35 with comorbidities).Hematological/Lymphatic: NegativeEndocrine/Metabolic:  Negative.-Diabetes mellitus:  The patient does not have diabetes mellitus.-Thyroid Disorders:  Patient has hyperthyroidism.-Metabolic Disorders:  Patient has hyperlipidemia.Behavioral/Social/Psychiatric & Syndromes:  Patient has anxiety, attention deficit hyperactivity disorder and depression.Physical ExamCardiovascular:      Rhythm: irregularPulmonary:  normal exam    No findings of peripheral edema on physical exam.Airway:  Mallampati: IITM distance: >3 FBNeck ROM: fullDental:  unremarkable  Anesthesia PlanASA 3 The primary anesthesia plan is  MAC. TIVA-MAC/GA with propofol, SASAMPerioperative Code Status confirmed: It is my understanding that the patient is currently designated as 'Full Code' and will remain so throughout the perioperative period.Anesthesia informed consent obtained. Consent obtained from: patientUse of blood products: consented  Consent Comment: Anesthetic plan discussed with the patient in detail. He/She understands the benefits & risks of anesthesia, and he/she consented to the planned anesthetic. Questions answered and all concerns addressedAnesthesiologist's Pre Op NoteI personally evaluated and examined the patient prior to the intra-operative phase of care on the day of the procedure.Marland Kitchen

## 2023-01-02 ENCOUNTER — Ambulatory Visit: Admit: 2023-01-02 | Payer: PRIVATE HEALTH INSURANCE | Attending: Cardiovascular Disease | Primary: Internal Medicine

## 2023-01-02 ENCOUNTER — Encounter: Admit: 2023-01-02 | Payer: PRIVATE HEALTH INSURANCE | Attending: "Endocrinology | Primary: Internal Medicine

## 2023-01-02 DIAGNOSIS — E0500 Thyrotoxicosis with diffuse goiter without thyrotoxic crisis or storm: Secondary | ICD-10-CM

## 2023-01-02 DIAGNOSIS — I4891 Unspecified atrial fibrillation: Secondary | ICD-10-CM

## 2023-01-02 MED ORDER — DEXMETHYLPHENIDATE ER 20 MG CAPSULE,EXTENDED RELEASE BIPHASIC50-50
20 | Freq: Every day | ORAL | Status: AC
Start: 2023-01-02 — End: ?

## 2023-01-02 MED ORDER — APIXABAN 5 MG TABLET
5 | ORAL_TABLET | Freq: Two times a day (BID) | ORAL | 4 refills | Status: AC
Start: 2023-01-02 — End: ?

## 2023-01-02 MED ORDER — METOPROLOL SUCCINATE ER 50 MG TABLET,EXTENDED RELEASE 24 HR
50 | ORAL_TABLET | Freq: Every day | ORAL | 2 refills | Status: AC
Start: 2023-01-02 — End: ?

## 2023-01-02 NOTE — Progress Notes
 TELEPHONE VISIT: For this visit the clinician and patient were present via telephone (audio only).Patient counseled on available options for visit type; Patient elected telephone visit (audio only); Patient consent given for telephone (audio only) visit : YesState patient is located in: CTThe clinician is appropriately licensed in the above state to provide care for this visitPatient Identity was confirmed during this call.  Other individuals actively participating in the telephone encounter and their name/relation to the patient: noneTotal time spent in medical telephone (audio only) discussion: 11Because this visit was completed over telephone, a hands-on physical exam was not performed.  Patient understands and knows to call back if condition changes.62 YO F with recent diagnosis of atrial fibrillation and hyperthyroidism presents for follow up She had new a. Fib in context of diagnosis of thyrotoxicosis She had improvement of hyperthyroid symptoms and normalization of T3/T4, but had persistent dyspnea and fatigue in the context of ongoing a.fib.  Thus sent for DCCV which restored NSR.  She now feels well with resolution of her dyspnea and fatigue, though a little fatigue with the propranolol.  Heart rates have been normal via her Apple watch.Will chnge propranolol to metoprolol.  Assuming no changes in symptoms, plan to follow up in 3-4 months to discuss longer term plan for a.fib once thyroid function has more stabilized. She takes dexmethylphenidate 20mg  at baseline, and was wondering if she could go back on this.  We discussed may increase risk of a.fib, but if causing significant QOL issues, can re-try.  She will hold off for now until her thyroid is more stable and reconsider in future.  A.fib: rate controlled: She is a CHADS2VASc 1. R/B discussed re: AC. She would prefer [facility] for now. She may not need lifelong AC as this is provoked a.fib in setting of hyperthyroidism. In future, with normal thyroid function, if NSR is restored, an extended monitor can be obtained to assess long term need for [facility]. - cont eliquis for now uniterrupted given recent DCCV- cont propranolol 100 BID-->metop 50XL- plan for 1 month monitor to assess need for long-term AC - weight loss recommended- avoidance of EtOH/caffeine RTC 3-4 months

## 2023-01-10 ENCOUNTER — Encounter: Admit: 2023-01-10 | Payer: PRIVATE HEALTH INSURANCE | Attending: Cardiovascular Disease | Primary: Internal Medicine

## 2023-01-10 ENCOUNTER — Encounter: Admit: 2023-01-10 | Payer: PRIVATE HEALTH INSURANCE | Attending: "Endocrinology | Primary: Internal Medicine

## 2023-01-11 ENCOUNTER — Telehealth: Admit: 2023-01-11 | Payer: PRIVATE HEALTH INSURANCE | Primary: Internal Medicine

## 2023-01-11 NOTE — Telephone Encounter
 Per Dr. Dimple Casey and agree with plan

## 2023-01-11 NOTE — Telephone Encounter
 Called  patient  regarding Kristie Frye messagePt is currently in [place]-  PT states she keeps flipping in and out of AFIB.   Pulse ox 92%.  HR 39.   PT is having some chest discomfort.  Pt continues to have SOB and not sleeping  well.  Waking up multiple times a night with SOB.   Notified pt that she should go to an urgent to be evaluated.    PT will update office after  she goes to urgent care

## 2023-01-12 ENCOUNTER — Encounter: Admit: 2023-01-12 | Payer: PRIVATE HEALTH INSURANCE | Attending: Cardiovascular Disease | Primary: Internal Medicine

## 2023-01-12 ENCOUNTER — Ambulatory Visit: Admit: 2023-01-12 | Payer: PRIVATE HEALTH INSURANCE | Primary: Internal Medicine

## 2023-01-12 NOTE — Telephone Encounter
 MyChart message received from patient in follow up to this conversation:Kristie Frye to P [facility] Nurse Triage Pool (supporting Lorene Dy, MD)  01/12/23  2:43 PMI ended up not going to urgent care. I started feeling better after drinking a lot of water so I might?ve been dehydrated.  I also might?ve doubled my dose of my morning meds. I?m not in my regular routine while on vacation.  By the way, i have had several episodes afib on my watch but felt a steady state of it intermittently in the last 2 weeks, for the record.[name]

## 2023-01-12 NOTE — Telephone Encounter
Patient read mychart message and responded.

## 2023-01-12 NOTE — Telephone Encounter
 Left MyChart message with MD response also

## 2023-01-17 ENCOUNTER — Encounter: Admit: 2023-01-17 | Payer: PRIVATE HEALTH INSURANCE | Primary: Internal Medicine

## 2023-01-20 LAB — TSH: TSH, 3RD GENERATION: 0.06 m[IU]/L — ABNORMAL LOW (ref 0.40–4.50)

## 2023-01-20 LAB — T3: T3 TOTAL: 127 ng/dL (ref 76–181)

## 2023-01-20 LAB — T4, FREE: FREE T4: 0.9 ng/dL (ref 0.8–1.8)

## 2023-01-22 ENCOUNTER — Encounter: Admit: 2023-01-22 | Payer: PRIVATE HEALTH INSURANCE | Attending: "Endocrinology | Primary: Internal Medicine

## 2023-01-22 DIAGNOSIS — E0500 Thyrotoxicosis with diffuse goiter without thyrotoxic crisis or storm: Secondary | ICD-10-CM

## 2023-01-22 DIAGNOSIS — I4891 Unspecified atrial fibrillation: Secondary | ICD-10-CM

## 2023-01-22 DIAGNOSIS — E0590 Thyrotoxicosis, unspecified without thyrotoxic crisis or storm: Secondary | ICD-10-CM

## 2023-01-22 MED ORDER — METHIMAZOLE 5 MG TABLET
5 mg | ORAL_TABLET | Freq: Every day | ORAL | 3 refills | Status: AC
Start: 2023-01-22 — End: 2023-01-26

## 2023-01-22 NOTE — Progress Notes
 Labs reviewed:Lab Results Component Value Date  TSH <0.005 (L) 11/28/2022  TSH, 3RD GENERATION 0.06 (L) 01/19/2023  TSH, 3RD GENERATION W/ REFLEX TO FT4 0.02 (L) 11/24/2022  FREE T4 0.9 01/19/2023  T3 TOTAL 127 01/19/2023  T3 FREE 7.2 11/28/2022  THYROGLOBULIN (ROCHE) 112.0 (H) 11/27/2022  THYROGLOBULIN AB (ROCHE) <15.0 11/27/2022  THYROID PEROXIDASE AB 86 (H) 11/26/2022  Current RX:  Methimazole 10 mg daily Recommendations:- DECREASE methimazole to 7.5 mg daily- repeat labs in 6-8 weeks, all orders placed

## 2023-01-22 NOTE — Other
 Please let the patient know I am sending her my chart message about labs ( good) and new dose instructions. Thank you, Marian Sorrow

## 2023-01-25 ENCOUNTER — Telehealth: Admit: 2023-01-25 | Payer: PRIVATE HEALTH INSURANCE | Attending: Family | Primary: Internal Medicine

## 2023-01-25 NOTE — Telephone Encounter
 Pt needs an in-person appointment with me, not urgent. I think a Tuesday AM or Friday would be best. It is to continue the process of obtaining the [employer]act[employer] Pump, she should be aware that she needs f/u to qualify.  Left vm

## 2023-01-26 ENCOUNTER — Encounter: Admit: 2023-01-26 | Payer: PRIVATE HEALTH INSURANCE | Primary: Internal Medicine

## 2023-01-26 ENCOUNTER — Ambulatory Visit: Admit: 2023-01-26 | Payer: PRIVATE HEALTH INSURANCE | Primary: Internal Medicine

## 2023-01-26 MED ORDER — METHIMAZOLE 5 MG TABLET
5 | Freq: Every day | ORAL | 2.00 refills | 90.00000 days | Status: AC
Start: 2023-01-26 — End: 2023-03-26

## 2023-01-26 MED ORDER — GUAIFENESIN ER 600 MG TABLET, EXTENDED RELEASE 12 HR
600 | ORAL_TABLET | Freq: Two times a day (BID) | ORAL | 1 refills | 10.00000 days | Status: AC
Start: 2023-01-26 — End: ?

## 2023-01-26 MED ORDER — OSELTAMIVIR 75 MG CAPSULE
75 | ORAL_CAPSULE | Freq: Two times a day (BID) | ORAL | 1 refills | 5.00000 days | Status: AC
Start: 2023-01-26 — End: ?

## 2023-01-26 MED ORDER — BENZONATATE 100 MG CAPSULE
100 | ORAL_CAPSULE | Freq: Three times a day (TID) | ORAL | 1 refills | 7.00000 days | Status: AC | PRN
Start: 2023-01-26 — End: ?

## 2023-01-30 ENCOUNTER — Encounter: Admit: 2023-01-30 | Payer: PRIVATE HEALTH INSURANCE | Attending: Family | Primary: Internal Medicine

## 2023-01-30 DIAGNOSIS — I890 Lymphedema, not elsewhere classified: Secondary | ICD-10-CM

## 2023-01-30 DIAGNOSIS — I872 Venous insufficiency (chronic) (peripheral): Secondary | ICD-10-CM

## 2023-01-30 NOTE — Progress Notes
 [facility]-  Physicians BuildingDivision of Vascular Surgery and Endovascular TherapyChief Complaint:Purple discolouration of toesHistory of Present Illness:Kristie Frye is a 62 y.o. female who presents to clinic today for evaluation of purple discolouration of bilateral toes. The patient reports associated swelling and generalized aching not associated with prolonged standing. She denies any heaviness, prior history of oedema, DVT, trauma, surgery, or radiation to bilateral lower extremities. She denies any claudication, ischaemic rest pain, wounds or ulcers. The patient has not tried compression therapy. She was recently started on Eliquis for new diagnosis of Afib in the setting of hyperthyroidism. She is a never smoker. Past Medical History:The patient has a past medical history of ADHD (attention deficit hyperactivity disorder), Arrhythmia, Dysthymia, GERD (gastroesophageal reflux disease), Heart palpitations, Hypertension, Pneumonia, Reflux, and Syncope. Past Surgical History:The patient has a past surgical history that includes Ablation saphenous vein w/ RFA; Urethral sling; and Laparoscopic colon resection.Social History:The patient reports that she has never smoked. She has never used smokeless tobacco. She reports that she does not currently use alcohol. She reports that she does not use drugs.Family History:The patient's family history includes Breast cancer in her paternal aunt; Coronary Artery Disease in her mother; Diabetes in her brother, brother, father, and mother; Heart disease in her mother; Hypertension in her brother and brother; Obesity in her brother and brother; Pulmonary embolism in her mother; Sleep apnea in her brother and brother; Thrombophilia in her mother.Medications:Current Outpatient Medications on File Prior to Visit Medication Sig Dispense Refill  albuterol sulfate 90 mcg/actuation HFA aerosol inhaler INHALE 2 PUFFS INTO THE LUNGS EVERY 6 HOURS AS NEEDED FOR WHEEZING 8.5 g 11  apixaban (ELIQUIS) 5 mg tablet Take 1 tablet (5 mg total) by mouth 2 (two) times daily. 60 tablet 0  cholecalciferol, vitamin D3, 25 mcg (1,000 unit) tablet Take 1 tablet (1,000 Units total) by mouth daily.    DULoxetine (CYMBALTA) 60 mg capsule take 1 capsule by mouth daily 90 capsule 4  fluocinolone acetonide oil (DERMOTIC) 0.01 % ear drops Place into both ears daily as needed.    ivermectin (SOOLANTRA) 1 % cream Apply 1 Application topically 2 (two) times daily. 45 g 1  ketoconazole (NIZORAL) 2 % shampoo Apply topically twice a week.    methIMAzole (TAPAZOLE) 5 mg tablet Take 4 tablets (20 mg total) by mouth daily. 120 tablet 0  omeprazole (PRILOSEC) 40 mg capsule take 1 capsule by mouth daily 30 capsule 11  propranoloL (INDERAL) 80 mg Immediate Release tablet Take 1 tablet (80 mg total) by mouth 3 (three) times daily. 90 tablet 0 Current Facility-Administered Medications on File Prior to Visit Medication Dose Route Frequency Provider Last Rate Last Admin  [DISCONTINUED] apixaban (ELIQUIS) tablet 5 mg  5 mg Oral Q12H Donald Siva, MD   5 mg at 11/29/22 4782  [DISCONTINUED] cholecalciferol (vitamin D3) tablet 1,000 Units  1,000 Units Oral Daily Britta Mccreedy, MD   1,000 Units at 11/29/22 9562  [DISCONTINUED] DULoxetine (CYMBALTA) DR capsule 60 mg  60 mg Oral Daily Britta Mccreedy, MD   60 mg at 11/29/22 1308  [DISCONTINUED] methIMAzole (TAPAZOLE) tablet 15 mg  15 mg Oral Daily Donnetta Simpers, [place]   15 mg at 11/29/22 6578  [DISCONTINUED] pantoprazole (PROTONIX) EC tablet 40 mg  40 mg Oral Daily Donnetta Simpers, [place]   40 mg at 11/29/22 4696  [DISCONTINUED] propranoloL (INDERAL) Immediate Release tablet 60 mg  60 mg Oral TID Donnetta Simpers, PA   60 mg at 11/29/22 0941  [DISCONTINUED] propranoloL (INDERAL)  Immediate Release tablet 80 mg  80 mg Oral TID Dascanio, Bethany, APRN   80 mg at 11/29/22 1500  [DISCONTINUED] sodium chloride 0.9 % flush 3 mL  3 mL IV Push Q8H Tinna, Harmandeep, MD   3 mL at 11/29/22 0939  [DISCONTINUED] sodium chloride 0.9 % flush 3 mL  3 mL IV Push PRN for [facility] Britta Mccreedy, MD   3 mL at 11/27/22 0509 Allergies:No Known AllergiesPhysical Exam:BP 114/70 (Site: r a, Position: Sitting, Cuff Size: Large)  - Pulse (!) 99  - Ht 5' 8 (1.727 m)  - Wt 116.6 kg  - LMP 02/02/2013 (Exact Date)  - SpO2 95%  - BMI 39.08 kg/m? On exam, the patient is comfortable and without distress. Respirations are unlabored. CV without tachycardia. Purple discolouration of bilateral toes 1-3. Intact motor/sensation, no wounds/ulcers, palpable pedal pulses, 1 oedema throughoutLabs:Lab Results Component Value Date  CREATININE 0.63 11/27/2022 Diagnostics:No interval imaging.CEAP Clinical Class:C3   Edema without skin changesAssessment/Plan:In summary, Kristie Frye is a 62 y.o. female who presents with chronic venous insufficiency. The patient's CEAP classification is characterized as 3. We had a long discussion regarding the pathophysiology of chronic venous insufficiency, including the natural history and expected progression of disease. We discussed first line therapy including compression and elevation, and maintaining an ideal body weight. We also discussed procedural therapies, including risks, benefits and alternatives. We will plan for compression therapy and venous reflux duplex and follow up thereafter. The patient was very happy with the plan. Thank you for the consultation and involving me in this patient's care. If you have any questions please feel free to contact me at any time. Addendum 01/30/23: Four week trial of compression, elevation, & exercise with symptoms still present: LE aching-pain, swelling and fatigueOne time trial and reason for failure of basic pump - pt trialed a basic pump at home on 01/17/23. It did not reduce limb swelling & caused increase in truncal swelling. Frutoso Schatz, MD, MS, RPVIAssistant Professor of Surgery [facility] MedicineOffice: [phone].[email or web address]

## 2023-02-09 ENCOUNTER — Encounter: Admit: 2023-02-09 | Payer: PRIVATE HEALTH INSURANCE | Attending: Internal Medicine | Primary: Internal Medicine

## 2023-02-13 ENCOUNTER — Ambulatory Visit: Admit: 2023-02-13 | Payer: PRIVATE HEALTH INSURANCE | Attending: Internal Medicine | Primary: Internal Medicine

## 2023-02-13 ENCOUNTER — Encounter: Admit: 2023-02-13 | Payer: PRIVATE HEALTH INSURANCE | Attending: Family | Primary: Internal Medicine

## 2023-02-13 DIAGNOSIS — I872 Venous insufficiency (chronic) (peripheral): Secondary | ICD-10-CM

## 2023-02-13 DIAGNOSIS — M7989 Other specified soft tissue disorders: Secondary | ICD-10-CM

## 2023-02-13 MED ORDER — HYDROCODONE-HOMATROPINE 5 MG-1.5 MG/5 ML ORAL SOLUTION
5-1.5 | Freq: Four times a day (QID) | ORAL | 1 refills | 9.00000 days | Status: AC | PRN
Start: 2023-02-13 — End: ?

## 2023-02-13 MED ORDER — METOPROLOL TARTRATE IMMEDIATE RELEASE 25 MG TABLET
25 | ORAL_TABLET | ORAL | 12 refills | 90.00000 days | Status: AC
Start: 2023-02-13 — End: ?

## 2023-02-13 MED ORDER — DOXYCYCLINE HYCLATE 100 MG CAPSULE
100 | ORAL_CAPSULE | Freq: Two times a day (BID) | ORAL | 1 refills | 7.00000 days | Status: AC
Start: 2023-02-13 — End: 2023-04-16

## 2023-02-15 ENCOUNTER — Ambulatory Visit: Admit: 2023-02-15 | Payer: PRIVATE HEALTH INSURANCE | Attending: Internal Medicine | Primary: Internal Medicine

## 2023-02-15 ENCOUNTER — Ambulatory Visit (INDEPENDENT_AMBULATORY_CARE_PROVIDER_SITE_OTHER): Payer: Managed Care, Other (non HMO)

## 2023-02-15 ENCOUNTER — Ambulatory Visit
Admission: EM | Admit: 2023-02-15 | Discharge: 2023-02-15 | Disposition: A | Discharge status: Home / Self Care | Payer: Managed Care, Other (non HMO) | Attending: Physician Assistant | Admitting: Physician Assistant

## 2023-02-15 DIAGNOSIS — R051 Acute cough: Secondary | ICD-10-CM

## 2023-02-15 DIAGNOSIS — J40 Bronchitis, not specified as acute or chronic: Secondary | ICD-10-CM | POA: Insufficient documentation

## 2023-02-15 LAB — SARS CORONAVIRUS 2 BY RT PCR: SARS Coronavirus 2 by RT PCR: NEGATIVE

## 2023-02-15 MED ORDER — PROMETHAZINE-DM 6.25-15 MG/5ML PO SYRP
5.0000 mL | ORAL_SOLUTION | Freq: Four times a day (QID) | ORAL | 0 refills | Status: AC | PRN
Start: 2023-02-15 — End: ?

## 2023-02-15 MED ORDER — PREDNISONE 20 MG PO TABS: 40.0000 mg | ORAL_TABLET | Freq: Every day | ORAL | 0 refills | Status: AC

## 2023-02-15 NOTE — ED Provider Notes (Signed)
[facility]    CSN: [id number] Arrival date & time: 02/15/23  1650      History   Chief Complaint Chief Complaint  Patient presents with   Cough   Headache   Nasal Congestion    HPI Ariana Jefferson is a 62 y.o. female presenting for fatigue, cough, congestion, postnasal drainage, voice hoarseness, scratchy throat, pain in the right side of her back x 4 to 5 days.  Denies fever, chills, sweats, sinus pain, sore throat, wheezing.  Has taken a COVID test a few days ago and it was negative.  Patient was exposed to walking pneumonia a couple weeks ago by her grandson.  Has taken Sudafed over-the-counter.  No history of asthma, COPD and patient is a non-smoker.  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      Home Medications    Prior to Admission medications   Medication Sig Start Date End Date Taking? Authorizing Provider  atorvastatin (LIPITOR) 10 MG tablet atorvastatin 10 mg tablet 11/08/20  Yes [provider]  predniSONE (DELTASONE) 20 MG tablet Take 2 tablets (40 mg total) by mouth daily for 5 days. 02/15/23 02/20/23 Yes Shirlee Latch, PA-C  promethazine-dextromethorphan (PROMETHAZINE-DM) 6.25-15 MG/5ML syrup Take 5 mLs by mouth 4 (four) times daily as needed. 02/15/23  Yes Eusebio Friendly B, PA-C  naproxen (NAPROSYN) 500 MG tablet Take 1 tablet (500 mg total) by mouth 2 (two) times daily as needed for  moderate pain or headache. 07/16/19   Tommie Sams, DO  pantoprazole (PROTONIX) 20 MG tablet Take 1 tablet (20 mg total) by mouth daily for 7 days. 09/29/17 10/06/17  Nita Sickle, MD  tiZANidine (ZANAFLEX) 4 MG tablet tizanidine 4 mg tablet  TAKE 1 TABLET BY MOUTH AT BEDTIME AS NEEDED    [provider]    Family History Family History  Problem Relation Age of Onset   Hypertension Mother     Social History Social History   Tobacco Use   Smoking status: Never   Smokeless tobacco: Never  Vaping Use   Vaping status: Never Used  Substance Use Topics   Alcohol use: Yes   Drug use: No     Allergies   Nitrofurantoin   Review of Systems Review of Systems  Constitutional:  Positive for fatigue. Negative for chills, diaphoresis and fever.  HENT:  Positive for congestion, rhinorrhea, sore throat (mildly scratchy) and voice change. Negative for ear pain, sinus pressure and sinus pain.   Respiratory:  Positive for cough. Negative for shortness of breath and wheezing.   Cardiovascular:  Negative for chest pain.  Gastrointestinal:  Negative for abdominal pain, nausea and vomiting.  Musculoskeletal:  Negative for arthralgias and myalgias.  Skin:  Negative for rash.  Neurological:  Negative for weakness and headaches.  Hematological:  Negative for adenopathy.  Physical Exam Triage Vital Signs ED Triage Vitals  Encounter Vitals Group     BP 02/15/23 1821 111/78     Systolic BP Percentile --      Diastolic BP Percentile --      Pulse Rate 02/15/23 1821 88     Resp 02/15/23 1821 20     Temp 02/15/23 1821 98.2 F (36.8 C)     Temp Source 02/15/23 1821 Oral     SpO2 02/15/23 1821 94 %     Weight --      Height --      Head Circumference --      Peak Flow --      Pain Score 02/15/23 1820 4     Pain Loc --      Pain Education --      Exclude from Growth Chart --    No data found.  Updated Vital Signs BP 111/78 (BP Location: Right Arm)   Pulse 88   Temp  98.2 F (36.8 C) (Oral)   Resp 20   SpO2 94%    Physical Exam Vitals and nursing note reviewed.  Constitutional:      General: She is not in acute distress.    Appearance: Normal appearance. She is ill-appearing. She is not toxic-appearing.     Comments: +voice hoarseness  HENT:     Head: Normocephalic and atraumatic.     Nose: Nose normal.     Mouth/Throat:     Mouth: Mucous membranes are moist.     Pharynx: Oropharynx is clear. Posterior oropharyngeal erythema (mild with clear PND) present.  Eyes:     General: No scleral icterus.       Right eye: No discharge.        Left eye: No discharge.     Conjunctiva/sclera: Conjunctivae normal.  Cardiovascular:     Rate and Rhythm: Normal rate and regular rhythm.     Heart sounds: Normal heart sounds.  Pulmonary:     Effort: Pulmonary effort is normal. No respiratory distress.     Breath sounds: Rhonchi present.  Musculoskeletal:     Cervical back: Neck supple.  Skin:    General: Skin is dry.  Neurological:     General: No focal deficit present.     Mental Status: She is alert. Mental status is at baseline.     Motor: No weakness.     Gait: Gait normal.  Psychiatric:        Mood and Affect: Mood normal.        Behavior: Behavior normal.      UC Treatments / Results  Labs (all labs ordered are listed, but only abnormal results are displayed) Labs Reviewed  SARS CORONAVIRUS 2 BY RT PCR    EKG   Radiology No results found.  Procedures Procedures (including critical care time)  Medications Ordered in UC Medications - No data to display  Initial Impression / Assessment and Plan / UC Course  I have reviewed the triage vital signs and the nursing notes.  Pertinent labs & imaging results that were available during my care of the patient were reviewed by me and considered in my medical decision making (see chart for details).   62 year old female presents for 4 to 5-day history of fatigue, cough, congestion, voice  hoarseness.  Also reports pain in the right side of her back with coughing and significant coughing fits.  No fever or shortness of breath.  Vitals are stable.  She is  ill-appearing but nontoxic.  Voice is hoarse.  Mild nasal congestion, erythema posterior pharynx mildly with postnasal drainage.  Few scattered rhonchi.  COVID test obtained.  Negative.  Chest x-ray ordered to rule out pneumonia especially given exposure to walking pneumonia.  Wet read negative.  Acute bronchitis.  Sent prednisone and Promethazine DM to pharmacy.  Will send antibiotics if chest x-ray shows pneumonia.  Reviewed care.  Reviewed return precautions.  CXR overread is negative.   Final Clinical Impressions(s) / UC Diagnoses   Final diagnoses:  Acute cough  Bronchitis     Discharge Instructions      -I did not see any definite pneumonia on the x-ray but I will send you message on MyChart with the results.  If you have pneumonia I will send antibiotics for you.  If you have pneumonia take all antibiotics and repeat the x-ray in 4 weeks. - Symptoms most consistent with bronchitis which is generally viral.  It can take 2 to 6 weeks for symptoms to resolve.  I sent cough medicine to the pharmacy as well as prednisone.  Increase rest and fluids. - Return if fever or worsening symptoms.     ED Prescriptions     Medication Sig Dispense Auth. Provider   predniSONE (DELTASONE) 20 MG tablet Take 2 tablets (40 mg total) by mouth daily for 5 days. 10 tablet Shirlee Latch, PA-C   promethazine-dextromethorphan (PROMETHAZINE-DM) 6.25-15 MG/5ML syrup Take 5 mLs by mouth 4 (four) times daily as needed. 118 mL Shirlee Latch, PA-C      PDMP not reviewed this encounter.   Shirlee Latch, PA-C 02/15/23 1955

## 2023-02-15 NOTE — ED Triage Notes (Signed)
Sx started Monday  Hoarse  Productive Cough- greens mucus Headache Stuffy nose Back pain No fever  OTC: sudafed.

## 2023-02-15 NOTE — Discharge Instructions (Addendum)
-  I did not see any definite pneumonia on the x-ray but I will send you message on MyChart with the results.  If you have pneumonia I will send antibiotics for you.  If you have pneumonia take all antibiotics and repeat the x-ray in 4 weeks. - Symptoms most consistent with bronchitis which is generally viral.  It can take 2 to 6 weeks for symptoms to resolve.  I sent cough medicine to the pharmacy as well as prednisone.  Increase rest and fluids. - Return if fever or worsening symptoms.

## 2023-02-27 ENCOUNTER — Encounter: Admit: 2023-02-27 | Payer: PRIVATE HEALTH INSURANCE | Attending: Family | Primary: Internal Medicine

## 2023-03-26 ENCOUNTER — Encounter: Admit: 2023-03-26 | Payer: PRIVATE HEALTH INSURANCE | Primary: Internal Medicine

## 2023-03-26 ENCOUNTER — Encounter: Admit: 2023-03-26 | Payer: PRIVATE HEALTH INSURANCE | Attending: Cardiovascular Disease | Primary: Internal Medicine

## 2023-03-26 ENCOUNTER — Ambulatory Visit: Admit: 2023-03-26 | Payer: PRIVATE HEALTH INSURANCE | Attending: "Endocrinology | Primary: Internal Medicine

## 2023-03-26 DIAGNOSIS — E0500 Thyrotoxicosis with diffuse goiter without thyrotoxic crisis or storm: Secondary | ICD-10-CM

## 2023-03-26 MED ORDER — METOPROLOL SUCCINATE ER 50 MG TABLET,EXTENDED RELEASE 24 HR
50 | ORAL_TABLET | Freq: Every day | ORAL | 2 refills | Status: AC
Start: 2023-03-26 — End: ?

## 2023-03-26 MED ORDER — APIXABAN 5 MG TABLET
5 | ORAL_TABLET | Freq: Two times a day (BID) | ORAL | 4 refills | Status: AC
Start: 2023-03-26 — End: ?

## 2023-03-26 MED ORDER — METHIMAZOLE 5 MG TABLET
5 | ORAL_TABLET | Freq: Every day | ORAL | 5 refills | 90.00000 days | Status: AC
Start: 2023-03-26 — End: 2023-04-23

## 2023-03-28 ENCOUNTER — Encounter: Admit: 2023-03-28 | Payer: PRIVATE HEALTH INSURANCE | Primary: Internal Medicine

## 2023-04-06 ENCOUNTER — Ambulatory Visit: Admit: 2023-04-06 | Payer: PRIVATE HEALTH INSURANCE | Attending: Cardiovascular Disease | Primary: Internal Medicine

## 2023-04-06 ENCOUNTER — Encounter: Admit: 2023-04-06 | Payer: PRIVATE HEALTH INSURANCE | Attending: Cardiovascular Disease | Primary: Internal Medicine

## 2023-04-06 VITALS — BP 130/70 | HR 101 | Ht 69.6 in | Wt 261.0 lb

## 2023-04-06 DIAGNOSIS — IMO0001 Reflux: Secondary | ICD-10-CM

## 2023-04-06 DIAGNOSIS — I4891 Unspecified atrial fibrillation: Secondary | ICD-10-CM

## 2023-04-06 DIAGNOSIS — I499 Cardiac arrhythmia, unspecified: Secondary | ICD-10-CM

## 2023-04-06 DIAGNOSIS — F909 Attention-deficit hyperactivity disorder, unspecified type: Secondary | ICD-10-CM

## 2023-04-06 DIAGNOSIS — F341 Dysthymic disorder: Secondary | ICD-10-CM

## 2023-04-06 DIAGNOSIS — K219 Gastro-esophageal reflux disease without esophagitis: Secondary | ICD-10-CM

## 2023-04-06 DIAGNOSIS — R002 Palpitations: Secondary | ICD-10-CM

## 2023-04-06 DIAGNOSIS — R55 Syncope and collapse: Secondary | ICD-10-CM

## 2023-04-06 DIAGNOSIS — I4719 Other supraventricular tachycardia: Secondary | ICD-10-CM

## 2023-04-06 DIAGNOSIS — R600 Localized edema: Secondary | ICD-10-CM

## 2023-04-06 DIAGNOSIS — I10 Essential (primary) hypertension: Secondary | ICD-10-CM

## 2023-04-06 DIAGNOSIS — R0602 Shortness of breath: Secondary | ICD-10-CM

## 2023-04-06 DIAGNOSIS — E785 Hyperlipidemia, unspecified: Secondary | ICD-10-CM

## 2023-04-06 DIAGNOSIS — J189 Pneumonia, unspecified organism: Secondary | ICD-10-CM

## 2023-04-06 NOTE — Progress Notes
 Cardiology Outpatient VisitReason for Visit:Referring provider: ReferringHistory of Present Illness 63 YO F atrial fibrillation in context of hyperthyroidism, HTN presents for follow up Was seen in hospital 11/2022 for possible symptomatic a.fib in the setting of symptomatic hyperthyroidism/thyrotoxicosis (weight loss, weakness, loose stools, hot flashes).  She had new dyspnea on exertion during the subacute symptoms preceding this hospital course.  She was seen by endocrinology while hospitalized, unsure if graves or severe thyroiditis.  She was started on methimazole.  She was started on propranolol which was titrated for goal HR<110 on average.  Echo obtained, which was low nromal LVEF 50-55%, and no significantly valvular abnormalities, and mildly dilated LA10/2024: had ongoing fatigue, felt to be possibly related to a.fib and ongoing DOE.  Her thyroid function improved. Obtained a monitor to assess rate control, which avg HR was 102 BPM. Given her symptoms and improved thyroid function, we proceeded with DCCV, which was successful.   12/2022: telehealth.  She felt much better with fatigue/dyspnea when out of a.fib.  We changed propranolol to metoprolol. She takes dexmethylphenidate 20mg  at baseline, and was wondering if she could go back on this. We discussed may increase risk of a.fib, but if causing significant QOL issues, can re-tryShe called and thought was back in afib due to fatigue with low heart rates, but possibly was just dehydrated per her record after going to urgent care.  Last thyroid labs normal t4/t3 but TSH remains suppressed.  03/2023: She experiences irregular heartbeats, primarily noticeable when lying down with her head on the pillow, and hears her heartbeat in her ear. These episodes have been occurring over the past few days, lasting about ten minutes each, and are not associated with shortness of breath or fatigue.  She it was unsure if this is related to atrial fibrillation.  She was very where if she was atrial fibrillation and she was suddenly feel tired and dyspneic.  She was not experienced this with the hearing sensation of an irregular heartbeat while lying in bed at night.  She otherwise denies any PND orthopnea or lower extremity edema.  She does feel generalized fatigue.  She has noted snoring.Social: non-smoker, no EtOH 3 years.  APRN in psychology Family: mother with CAD/afib Past Medical History:Past Medical History: Diagnosis Date  ADHD (attention deficit hyperactivity disorder)   Arrhythmia   occasional palpitation - distant past cleared by ekg  Dysthymia   GERD (gastroesophageal reflux disease)   Heart palpitations   Hypertension   Pneumonia   Reflux   Syncope   distant past - 25+ years ago  Past Surgical History:Past Surgical History: Procedure Laterality Date  ABLATION SAPHENOUS VEIN W/ RFA    LAPAROSCOPIC COLON RESECTION    partial removal of small intestine  URETHRAL SLING    Family Medical History: Social History: No Known AllergiesCurrent Outpatient Medications on File Prior to Visit Medication Sig Dispense Refill  albuterol sulfate 90 mcg/actuation HFA aerosol inhaler INHALE 2 PUFFS INTO THE LUNGS EVERY 6 HOURS AS NEEDED FOR WHEEZING 8.5 g 11  apixaban (ELIQUIS) 5 mg tablet Take 1 tablet (5 mg total) by mouth 2 (two) times daily. 90 tablet 3  cholecalciferol, vitamin D3, 25 mcg (1,000 unit) tablet Take 1 tablet (1,000 Units total) by mouth daily.    DULoxetine (CYMBALTA) 60 mg capsule take 1 capsule by mouth daily 90 capsule 4  fluocinolone acetonide oil (DERMOTIC) 0.01 % ear drops Place into both ears daily as needed.    ketoconazole (NIZORAL) 2 % shampoo Apply topically twice  a week.    methIMAzole (TAPAZOLE) 5 mg tablet Take 1.5 tablets (7.5 mg total) by mouth daily. 45 tablet 4  metoprolol succinate XL (TOPROL-XL) 50 mg 24 hr tablet Take 1 tablet (50 mg total) by mouth daily. Take with or immediately following a meal. 90 tablet 1  metoprolol tartrate (LOPRESSOR) 25 mg Immediate Release tablet 1-2 tabs every 12 hours as needed for breakthru heart rate over 100 60 tablet 11  omeprazole (PRILOSEC) 40 mg capsule take 1 capsule by mouth daily 30 capsule 11  doxycycline hyclate (VIBRAMYCIN) 100 mg capsule Take 1 capsule (100 mg total) by mouth 2 (two) times daily. 14 capsule 0  ivermectin (SOOLANTRA) 1 % cream Apply 1 Application topically 2 (two) times daily. 45 g 1 No current facility-administered medications on file prior to visit. Review of Systems ROS 10 point ROS negative except for what is stated in HPI Vital Signs and Physical Exam Vitals:  04/06/23 1136 BP: 130/70 Site: Left Arm Position: Sitting Cuff Size: Large Pulse: (!) 101 SpO2: 95% Weight: 118.4 kg Height: 5' 9.6 (1.768 m) Physical Exam Constitutional: She appears healthy. Neck: No JVD present. Cardiovascular: Normal rate, regular rhythm, S1 normal, S2 normal and normal heart sounds. No murmur heard.Pulmonary/Chest: Breath sounds normal. Musculoskeletal:       General: No edema.    Comments: 2+ pitting edema to her shins Neurological: She is alert and oriented to person, place, and time. Laboratory Studies Creatinine Date/Time Value Ref Range Status 01/19/2023 10:20 AM 0.72 0.50 - 1.05 mg/dL Final 08/65/7846 96:29 AM 0.63 0.40 - 1.30 mg/dL Final 52/84/1324 40:10 PM 0.63 0.40 - 1.30 mg/dL Final Platelets Date/Time Value Ref Range Status 11/27/2022 05:19 AM 191 150 - 420 x1000/?L Final Cholesterol, Total Date/Time Value Ref Range Status 01/20/2022 10:04 AM 189 <200 mg/dL Final Triglycerides Date/Time Value Ref Range Status 01/20/2022 10:04 AM 126 <150 mg/dL Final HDL Date/Time Value Ref Range Status 01/20/2022 10:04 AM 51 > OR = 50 mg/dL Final LDL Cholesterol Date/Time Value Ref Range Status 01/20/2022 10:04 AM 114 (H) mg/dL (calc) Final   Comment:   Reference range: <100 Desirable range <100 mg/dL for primary prevention;  <70 mg/dL for patients with CHD or diabetic patients with > or = 2 CHD risk factors. LDL-C is now calculated using the Martin-Hopkins calculation, which is a validated novel method providing better accuracy than the Friedewald equation in the estimation of LDL-C. Horald Pollen et al. Lenox Ahr. 2725;366(44): 2061-2068 (http://education.[employer].com/faq/FAQ164) INR Date/Time Value Ref Range Status 05/10/2010 10:23 AM 0.88 0.80 - 1.15 Final Lab Results Component Value Date  HGBA1C 6.4 09/15/2022  HGBA1C 6.1 (H) 01/20/2022  HGBA1C 6.1 01/21/2021  HGBA1C 6.2 11/04/2020  HGBA1C 5.5 02/13/2019  HGBA1C 5.7 08/26/2013  Diagnostic Studies EKG:  Rate controlled atrial fibrillation and nonspecific T-wave abnormalitiesEKG dated 02/14 2025:  Sinus tachycardia abnormal T-wave inversions in the high lateral leads which he was relatively unchanged from a scanned EKG dated 02/15/2023.Non-Cardiac Diagnostic Studies:Cardiac Diagnostic Studies:Transthoracic Echocardiogram:Results for orders placed or performed during the hospital encounter of 11/26/22 Echo 2D Complete w Doppler and CFI if Ind Image Enhancement 3D and or bubbles Result Value Ref Range  Reported Visual Range EF% 50-55 %  Narrative   * Technically limited study.* Normal left ventricular cavity size.  Mild concentric left ventricular hypertrophy.  No regional wall motion abnormalities.  Low normal left ventricular systolic function.  LVEF estimated by visual assessment was between 50-55%.  Diastolic function was difficult to determine due to atrial fibrillation.* Normal right ventricular  cavity size and systolic function.  Estimated right ventricular systolic pressure is 18 mmHg.* Left atrium is mildly dilated.  No interatrial shunt by color Doppler.  Right atrium is normal in size.* No significant valvular abnormalities.* No significant pericardial effusion.* No prior study available for comparison. Stress Test:No results found for this or any previous visit.Impression and Recommendations 63 YO F atrial fibrillation in context of hyperthyroidism, HTN presents for follow up Paroxymal atrial fib: symptomatic with dyspnea and fatigue, was provoked in context of hyperthyroidism.  Normal LVEF with mildly dilated LA.  CHADS2VASc= 2 (female, HTN).  Once thyroid completely back to normal, will plan for long term monitor.  [facility] decision making with [facility] for now.  She has experienced a sensation of abn HR at night (hears it at night) but no sustained a.fib sensations of dyspnea/fatigue/palpitations.  She wants to hold off on a monitor for now and will report if these symptoms sustain- metop 50 - eliquis 5 BID- weight loss (lost 25 lbs!) - sleep study - regular exercise Low normal LVEF: in setting of a.fib.  Repeat echo now that has restored NSR.  Fatigue: may be metop.  She wants to stay on this for now (as opposed to CCB).  Given snoring + a.fib, referred for sleep study. LE edema: resolved without diuretic therapy, no other HF symptoms, likely venous insufficiency vs. Thyroid related- keep legs elevated, cont compression stockings,HLD: LDL 120s, with family history of CAD.  She is losing weight, changing diet.  Will plan to repeat cholesterol in a few months time.  If remains elevated, will consider statin vs. CAC Follow-up in 4 Leonie Man, MD Assistant Professor of MedicineSection of Cardiology [facility]2/14/2025

## 2023-04-11 ENCOUNTER — Encounter: Admit: 2023-04-11 | Payer: PRIVATE HEALTH INSURANCE | Attending: "Endocrinology | Primary: Internal Medicine

## 2023-04-16 ENCOUNTER — Telehealth: Admit: 2023-04-16 | Payer: PRIVATE HEALTH INSURANCE | Attending: "Endocrinology | Primary: Internal Medicine

## 2023-04-16 ENCOUNTER — Ambulatory Visit: Admit: 2023-04-16 | Payer: PRIVATE HEALTH INSURANCE | Attending: "Endocrinology | Primary: Internal Medicine

## 2023-04-16 DIAGNOSIS — I4891 Unspecified atrial fibrillation: Secondary | ICD-10-CM

## 2023-04-16 DIAGNOSIS — E0500 Thyrotoxicosis with diffuse goiter without thyrotoxic crisis or storm: Principal | ICD-10-CM

## 2023-04-16 DIAGNOSIS — R5383 Other fatigue: Secondary | ICD-10-CM

## 2023-04-16 NOTE — Telephone Encounter
 Medication list, allergies and PMHx reviewed / updated by phone for today's video visit. Electronically Signed by Jeanell Sparrow, RN, April 16, 2023

## 2023-04-16 NOTE — Patient Instructions
 Continue methimazole 7.5  mg DAILY. Do labs this week to re-adjust the dose. Repeat labs now and in 3 months x 3Follow up in 6 months4.   Thyroid US - will repeat once in fall 2025 at [facility].   Telemedicine visit is probably ok

## 2023-04-21 LAB — CBC AND DIFFERENTIAL
BASOPHILS ABSOLUTE COUNT: 48 {cells}/uL (ref 0–200)
BASOPHILS: 1.1 %
EOSINOPHILS ABSOLUTE COUNT: 180 {cells}/uL (ref 15–500)
EOSINOPHILS: 4.1 %
HEMATOCRIT BLOOD: 42.5 % (ref 35.0–45.0)
HEMOGLOBIN: 14.6 g/dL (ref 11.7–15.5)
LYMPHOCYTES ABSOLUTE COUNT: 2191 {cells}/uL (ref 850–3900)
LYMPHOCYTES: 49.8 %
MCH: 31.3 pg (ref 27.0–33.0)
MCHC-HEMOGLOBINOPATHY: 34.4 g/dL (ref 32.0–36.0)
MCV: 91 fL (ref 80.0–100.0)
MONOCYTES ABSOLUTE COUNT: 352 {cells}/uL (ref 200–950)
MONOCYTES: 8 %
MPV: 10.6 fL (ref 7.5–12.5)
NEUTROPHILS ABSOLUTE COUNT: 1628 {cells}/uL (ref 1500–7800)
NEUTROPHILS: 37 %
PLATELET COUNT: 194 10*3/uL (ref 140–400)
RDW: 12.6 % (ref 11.0–15.0)
RED BLOOD CELL COUNT: 4.67 10*6/uL (ref 3.80–5.10)
WHITE BLOOD CELL COUNT: 4.4 10*3/uL (ref 3.8–10.8)

## 2023-04-23 DIAGNOSIS — E0500 Thyrotoxicosis with diffuse goiter without thyrotoxic crisis or storm: Secondary | ICD-10-CM

## 2023-04-23 MED ORDER — METHIMAZOLE 5 MG TABLET
5 | ORAL_TABLET | Freq: Every day | ORAL | 4 refills | Status: AC
Start: 2023-04-23 — End: ?

## 2023-04-23 NOTE — Telephone Encounter
 Called and spoke with pt regarding Dr. Curlene Dolphin note below.  Pt expressed understanding and was grateful for the call.  Electronically Signed by Benn Moulder, RN, April 23, 2023 Jessy Oto, MD  You2 minutes ago (2:59 PM) [employer] website, information for the patients. Thank you, Marian Sorrow

## 2023-04-23 NOTE — Other
 Sadly, we need to go back to higher dose of methimazole. 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  INCREASE methimazole to 10 mg daily = 2 pills. Re-check level sin 2 months, orders placed.Thank you, Marian Sorrow

## 2023-04-23 NOTE — Progress Notes
 Labs reviewed:Lab Results Component Value Date  TSH <0.005 (L) 11/28/2022  TSH, 3RD GENERATION <0.01 (L) 04/20/2023  TSH, 3RD GENERATION W/ REFLEX TO FT4 0.02 (L) 11/24/2022  FREE T4 1.4 04/20/2023  T3 TOTAL 185 (H) 04/20/2023  T3 FREE 7.2 11/28/2022  THYROGLOBULIN (ROCHE) 112.0 (H) 11/27/2022  THYROGLOBULIN AB (ROCHE) <15.0 11/27/2022  THYROID PEROXIDASE AB 86 (H) 11/26/2022  Current RX:  Methimazole 7.5 mg daily Recommendations:- increase methimazole to 10 mg daily- repeat labs in 8-10 weeks, all orders placed

## 2023-04-23 NOTE — Telephone Encounter
 Agree, [employer] website, information for the patients. Thank you, Marian Sorrow

## 2023-04-23 NOTE — Other
 Sadly, we need to go back to higher dose of methimazole. Graves NOT controlled yet. Kristie Frye

## 2023-04-23 NOTE — Telephone Encounter
 Called and spoke with pt regarding Dr. Curlene Dolphin note below.  Pt asked if there were any websites that Dr. Arbutus Ped recommends to read up on Graves disease.  Let pt know about the [employer] website and that Dr. Arbutus Ped would be made aware of her question and the office would get back to her with any recommendations from Dr. Arbutus Ped.  Pt expressed understanding and was grateful for the call.  Electronically Signed by Benn Moulder, RN, April 23, 2023----- Message from Cooper Render, MD sent at 04/23/2023  2:50 PM EST -----Sadly, we need to go back to higher dose of methimazole. 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  INCREASE methimazole to 10 mg daily = 2 pills. Re-check level sin 2 months, orders placed.Thank you, Marian Sorrow

## 2023-04-24 LAB — T4, FREE: FREE T4: 1.4 ng/dL (ref 0.8–1.8)

## 2023-04-24 LAB — THYROID STIMULATING IMMUNOGLOBULIN: THYROID STIMULATING IMMUNOGLOBULIN: 349 %{baseline} — ABNORMAL HIGH (ref <140)

## 2023-04-24 LAB — T3: T3 TOTAL: 185 ng/dL — ABNORMAL HIGH (ref 76–181)

## 2023-04-24 LAB — TSH: TSH, 3RD GENERATION: <0.01 m[IU]/L — ABNORMAL LOW (ref 0.40–4.50)

## 2023-04-24 LAB — THYROID PEROXIDASE ANTIBODY: THYROID PEROXIDASE ANTIBODIES: 28 [IU]/mL — ABNORMAL HIGH (ref <9)

## 2023-05-02 ENCOUNTER — Encounter: Admit: 2023-05-02 | Payer: PRIVATE HEALTH INSURANCE | Primary: Internal Medicine

## 2023-05-11 ENCOUNTER — Ambulatory Visit
Admit: 2023-05-11 | Payer: PRIVATE HEALTH INSURANCE | Attending: Student in an Organized Health Care Education/Training Program | Primary: Internal Medicine

## 2023-05-16 ENCOUNTER — Encounter: Admit: 2023-05-16 | Payer: PRIVATE HEALTH INSURANCE | Primary: Internal Medicine

## 2023-05-18 ENCOUNTER — Encounter: Admit: 2023-05-18 | Payer: PRIVATE HEALTH INSURANCE | Attending: Internal Medicine | Primary: Internal Medicine

## 2023-05-18 VITALS — BP 118/76 | HR 78

## 2023-05-18 DIAGNOSIS — I10 Essential (primary) hypertension: Secondary | ICD-10-CM

## 2023-05-18 DIAGNOSIS — I480 Paroxysmal atrial fibrillation: Secondary | ICD-10-CM

## 2023-05-18 DIAGNOSIS — E0500 Thyrotoxicosis with diffuse goiter without thyrotoxic crisis or storm: Secondary | ICD-10-CM

## 2023-05-25 ENCOUNTER — Ambulatory Visit
Admit: 2023-05-25 | Payer: PRIVATE HEALTH INSURANCE | Attending: Student in an Organized Health Care Education/Training Program | Primary: Internal Medicine

## 2023-05-25 VITALS — BP 122/80 | HR 85 | Temp 97.80000°F | Resp 20 | Ht 69.6 in | Wt 263.0 lb

## 2023-05-25 DIAGNOSIS — R0683 Snoring: Secondary | ICD-10-CM

## 2023-05-25 DIAGNOSIS — I4891 Unspecified atrial fibrillation: Secondary | ICD-10-CM

## 2023-05-25 NOTE — Progress Notes
 [facility] [address] [place], [place] [address]VWUJW: [phone]    Fax: [phone] Chapman Fitch, MD - Caralyn Guile, MD - Hildred Alamin, MD - Lorenza Evangelist, MD, PhD -Wonda Amis, MD - Hadassah Pais, MD, - Lolita Lenz PhD - Camille Bal, PsyD, -Arsenio Katz, MD - Wynona Neat, MD - Cheral Almas, MD - Duwaine Maxin, [place] - Ortencia Kick, [place] - Christella Noa, MD Donovan Kail, MD, MS, Medical Director - Valora Piccolo, MD, MPH, Director 		SLEEP MEDICINE Patient Name:  Kristie Frye of Birth:  [birth year]Date of Service: 4/4/2025EPIC MRN:  [id number] Referring provider: Lorene Dy, MD Primary care provider: Caprice Renshaw Consultant:  Duwaine Maxin, Acquanetta Belling for Visit Kristie Frye  was seen in sleep medicine in consultation at the request of Lorene Dy, MD regarding a fib .  History of Present Illness [name] is a 63 y.o. woman with a past medical history of depression, anxiety, GERD, hypertension, hyperlipidemia, hyperthyroidism, atrial fibrillation, and elevated BMI (38) Diagnosed with a fib about 5 months ago. Currently in NSR s/p cardioversion. Referred to sleep medicine as part of her work up to evaluate for causes of atrial fibrillation.Main sleep related concern is that she does not wake up feeling rested.  Reported snoring and sometimes wakes herself up with her snoring.  Wakes up 2-5 times per night.Goes to bed late and takes about 1 hour to fall asleep.Does take a 2-3 hour nap in the afternoon and feels this disrupts her sleeping at nighttime.Other sleep symptoms: Yes snoringSometimes gasping awakeningsNo pauses in breathingYes dry mouthNo choking, shortness of breath. Teeth grinding: NoDentures: NoNo  leg discomfort preventing sleep,  leg twitching during sleepNo sleepwalking,   dream enactmentNo refluxYes nocturiaSometimes headachesCurrent sleep-wake history:Bedtime: 2 am  Sleep latency: 1 hour Rise time: 10am - 12 pm  Awakenings:  2-5 times  Estimated nocturnal sleep time: 8 hours Sleeps in a: Bed Bedpartner: No Sleep position: Right side lying and Left side lying - snores more on back On awakening in AM:  Never refreshedNapping:  most days for 2-3 hours   Inadvertent dozing: No Drowsy driving: NoEpworth Sleepiness Score: Epworth Total Score: (Patient-Rptd) 5Epworth: Full Flowsheet:   05/24/2023  11:08 AM 05/11/2023   2:37 AM Epworth Sleepiness Scale 1. Sitting and reading 0-Would never doze 0-Would never doze 2. Watching TV 1-Slight chance of dozing 1-Slight chance of dozing 3. Sitting, inactive in a public place (e.g. a theatre or a meeting) 0-Would never doze 0-Would never doze 4. As a passenger in a car for an hour without a break 0-Would never doze 0-Would never doze 5. Lying down to rest in the afternoon when circumstances permit 3-High chance of dozing 3-High chance of dozing 6. Sitting and talking to someone 0-Would never doze 0-Would never doze 7. Sitting quietly after a lunch without alcohol 1-Slight chance of dozing 1-Slight chance of dozing 8. In a car, while stopped for a few minutes in traffic 0-Would never doze 0-Would never doze Epworth Total Score 5  5    Patient-reported  Total Score only: Epworth Total Score: (Patient-Rptd) 5 FOSQ: Full Flowsheet:   05/24/2023  11:08 AM 05/11/2023   2:36 AM FOSQ 1. Do you have difficulty concentrating on the things you do because you are sleepy or tired? 2. Yes, moderate difficulty 2. Yes, moderate difficulty 2. Do you generally have difficulty remembering things because you are sleepy or tired? 2. Yes, moderate difficulty 2. Yes, moderate difficulty 3. Do  you have difficulty operating a motor vehicle for short distances (less than 100 miles) because you become sleepy? 4. No difficulty 4. No difficulty 4. Do you have difficulty operating a motor vehicle for long distances (greater than 100 miles) because you become sleepy?  2. Yes, moderate difficulty 2. Yes, moderate difficulty 5. Do you have difficulty visiting your family or friends in their home because you become sleepy or tired? 4. No difficulty 4. No difficulty 6. Has your relationship with family, friends or work colleagues been affected because you are sleepy or tired? 3. Yes, A little difficulty 3. Yes, A little difficulty 7. Do you have difficulty watching a movie or video because you become sleepy or tired? 3. Yes, A little difficulty 3. Yes, A little difficulty 8. Do you have difficulty being as active as you want to be in the evening because you are sleepy or tired? 3. Yes, A little difficulty 3. Yes, A little difficulty 9. Do you have difficulty being as active as you want to be in the morning because you are sleepy or tired? 2. Yes, moderate difficulty 2. Yes, moderate difficulty 10. Has your desire for intimacy or sex been affected because you are sleepy or tired? 0. I don't do this activity for other reasons 0. I don't do this activity for other reasons General Productivity (Questions 1 &2; Range 1-4) 2  2  Activity Level (Questions 3, 4 & 5; N/A or Range 1-4) [id number]  [id number]  Vigilance (Questions 6, 7 & 8; Range 1-4) 3  3  FOSQ Total Score (Range 5-20) [id number]  [id number]    Patient-reported  Total Score only: FOSQ Cummulative Score: (Patient-Rptd) 25 Insomnia Severity: Full Flowsheet:   05/24/2023  11:07 AM 05/11/2023   2:35 AM AMB INSOMIA SEVERITY INDEX 1. Difficulty falling asleep 2-Moderate 2-Moderate 2. Difficulty staying asleep 2-Moderate 2-Moderate 3. Problems waking up too early 1-Mild 1-Mild 4. How SATISFIED/DISSATIFIED are you with your CURRENT sleep pattern? 4-Very Dissatified 4-Very Dissatified 5. How NOTICEABLE to others do you think your sleep problem is in terms of impairing the quality of your life? 0-Not at all Noticeable 0-Not at all Noticeable 6. How WORRIED/DISTRESSED are you about your current sleep problem? 2-Somewhat 2-Somewhat 7. To what extent do you consider your sleep problem to INTERFERE with your daily functioning (e.g. daytime fatique, mood, ability to function at work/daily chores, concentration, memory, etc.) CURRENTLY? 4-Very Much Interfering 4-Very Much Interfering Insomnia Severity Index Total Score Row 15  15    Patient-reported Total Score only: Insomnia Severity Index Total Score Row: (Patient-Rptd) 15Review of Systems  No to all Yes to checked items Comments CONST []   [] weight gain  [x] weight loss  [] fatigue 20 lbs  EYES [x]   [] dry eyes  [] ?vision  [] glasses/contacts   ENT [x]   [] nasal congestion  [] nosebleeds  [] nasal fracture  [] postnasal drip [] difficulty swallowing  [] dry throat [] hoarseness  [] ?hearing  [] dentures [] dental problems [] tooth grinding  CV [x]   [] chest pain   [] palpitations   [] leg swelling  RESP [x]   [] shortness of breath  [] cough  [] wheezing  GI []   [x] heartburn/reflux [] Irritable bowel  [] GI bleeding  GU []   [x] urination at night [] bedwetting  [] frequency [] heavy menses [] pregnant [] menopausal sympt  [] postmenopause[] erectile dysfunction    SKIN [x]   [] rash  [] skin irritation  [] blue fingers/toes   MS [x]   [] body aches   [] back pain  [] joint pain  NEURO []   [x] headaches  [] seizures  [] muscle weakness Sometimes PSYCH [x]   [] depression  [] anxiety  []   panic   [] claustrophobia  ENDO [x]   [] night sweats   [] heat intolerance [] cold intolerance  HEME [x]   [] anemia  [] iron deficiency  [] easy bruising  ALL/IM []   [] environmental allergy  [] food allergy  [] HIV positive  Past Medical History Ms. Prien  has a past medical history of ADHD (attention deficit hyperactivity disorder), Arrhythmia, Dysthymia, GERD (gastroesophageal reflux disease), Heart palpitations, Hypertension, Pneumonia, Reflux, and Syncope.Past Surgical History Ms. Paganelli  has a past surgical history that includes Ablation saphenous vein w/ RFA; Urethral sling; and Laparoscopic colon resection.  Medications Current Outpatient Medications Medication Sig  albuterol sulfate INHALE 2 PUFFS INTO THE LUNGS EVERY 6 HOURS AS NEEDED FOR WHEEZING  apixaban Take 1 tablet (5 mg total) by mouth 2 (two) times daily.  cholecalciferol (vitamin D3) Take 1 tablet (1,000 Units total) by mouth daily.  DULoxetine take 1 capsule by mouth daily  fluocinolone acetonide oil Place into both ears daily as needed.  ketoconazole Apply topically twice a week.  methIMAzole Take 2 tablets (10 mg total) by mouth daily.  metoprolol succinate XL Take 1 tablet (50 mg total) by mouth daily. Take with or immediately following a meal.  metoprolol tartrate 1-2 tabs every 12 hours as needed for breakthru heart rate over 100  omeprazole take 1 capsule by mouth daily No current facility-administered medications for this visit. Allergies Ms. Athanas has No Known Allergies. Family History Ms. Saxby's family history includes Breast cancer in her paternal aunt; Coronary Artery Disease in her mother; Diabetes in her brother, brother, father, and mother; Heart disease in her mother; Hypertension in her brother and brother; Obesity in her brother and brother; Pulmonary embolism in her mother; Sleep apnea in her brother and brother; Thrombophilia in her mother.  Father and both brothers have sleep apnea. Social History Ms. Denne is retired .  She consumes 0 caffeinated beverages per day.  She  reports that she has never smoked. She has never used smokeless tobacco. She reports that she does not currently use alcohol. She reports that she does not use drugs.Physical Examination Last menstrual period 02/02/2013, not currently breastfeeding. There is no height or weight on file to calculate BMI. There were no vitals filed for this visit.Wt Readings from Last 3 Encounters: 04/06/23 118.4 kg 02/15/23 117.5 kg 02/13/23 119.3 kg    No data to display    .General: Well appearing, no distress, no conversational or ambulatory dyspneaHEENT:  anicteric, no significant rhinorrhea, dentition good, oropharynx  clear without lesion, class 3Neck:  SuppleRespiratory:  Even and unlaboredExtremities:   well perfused, no cyanosis, or clubbingMusculoskeletal: Normal strength, bulk, and tone Neuro: Nonfocal exam, normal gait, no tremorsPsychiatric: Alert, interactive, and appropriate mood and affect normal  Data Review PRIOR SLEEP TESTING:  None LABS: Lab Results Component Value Date  WBC 4.4 04/20/2023  HGB 14.6 04/20/2023  HCT 42.5 04/20/2023  MCV 91.0 04/20/2023  PLT 194 04/20/2023 Lab Results Component Value Date  CREATININE 0.70 04/13/2023  BUN 15 04/13/2023  NA 142 04/13/2023  K 4.0 04/13/2023  CL 105 04/13/2023  CO2 30 04/13/2023 Lab Results Component Value Date  TSH <0.005 (L) 11/28/2022  TSH3G <0.01 (L) 04/20/2023 Lab Results Component Value Date  IRON 93 08/26/2013  TIBC 451 (H) 08/26/2013  FERRITIN 14 08/26/2013 Holter monitor 2024:*The predominant rhythm was Atrial Fibrillation/Flutter .*The Maximum Heart Rate recorded was 152 bpm, 10/22 19:24:18, the Minimum Heart Rate recorded was 62 bpm, 10/23 05:48:14, and the Average Heart Rate was 102 bpm.*There were 2 VE beats with a  burden of <1 %.*The study included an Atrial Fibrillation/Flutter Burden of >99 % with 57 % in RVR and 0 % in SVR.The longest episode was 23h 60m 56.5s, 10/22 12:39:48, and the Fastest episode was 152 bpm, 10/22 12:39:48.*There were 0 Patient Triggers.Echo 2024: * Technically limited study.* Normal left ventricular cavity size.  Mild concentric left ventricular hypertrophy.  No regional wall motion abnormalities.  Low normal left ventricular systolic function.  LVEF estimated by visual assessment was between 50-55%.  Diastolic function was difficult to determine due to atrial fibrillation.* Normal right ventricular cavity size and systolic function.  Estimated right ventricular systolic pressure is 18 mmHg.* Left atrium is mildly dilated.  No interatrial shunt by color Doppler.  Right atrium is normal in size.* No significant valvular abnormalities.* No significant pericardial effusion.* No prior study available for comparison.Assessment MALKA BOCEK is a 63 y.o. woman with a past medical history of depression, anxiety, GERD, hypertension, hyperlipidemia, hyperthyroidism, atrial fibrillation, and elevated BMI (38) Referred for evaluation of possible sleep apnea in the context of new onset AFib.I have a suspicion for possible OSA given patients past medical history, family history of sleep apnea and reported symptoms of snoring, gasping, nocturia, occasional a.m. headaches, and unrefreshing sleep.Hypercarbia on recent labs with CO2 up to 35.  At risk for OHS with BMI of 38. In-lab diagnostic sleep study with TCC O2 ordered to further evaluate.  Plan Suspected OSA- diagnostic sleep study with TCC O2 ordered- The health consequences of untreated/under treated obstructive sleep apnea were explained to the patient  - Will be in contact following sleep study results -If Sleep apnea is seen on sleep study patient is amenable to PAP therapy  - Ok to discuss results via my chart - We discussed the importance of being attentive to cues of drowsy driving and avoid driving or engaging in any potentially hazardous activities if at all drowsy It is a pleasure to participate in the care of this patient.  Please feel free to contact me with any questions or concerns.Ralph Dowdy, [place] - CElectronically Signed by Duwaine Maxin, PA, May 25, 2023 10:51 AM

## 2023-05-28 ENCOUNTER — Encounter: Admit: 2023-05-28 | Payer: PRIVATE HEALTH INSURANCE | Primary: Internal Medicine

## 2023-05-28 MED ORDER — APIXABAN 5 MG TABLET
5 | ORAL_TABLET | Freq: Two times a day (BID) | ORAL | 4 refills | Status: AC
Start: 2023-05-28 — End: ?

## 2023-06-05 ENCOUNTER — Telehealth: Admit: 2023-06-05 | Payer: PRIVATE HEALTH INSURANCE | Primary: Internal Medicine

## 2023-06-05 NOTE — Telephone Encounter
 Pt left message in appt request that she was having bleeding during menopause. Called pt, states that she has been having some spotting recently, brown/red in color. Not having pain now, but does intermittently have pain near her right ovary. Has not had bleeding before. Overdue for routine, scheduled. Dr. Bland Bunnell recommends sonohyst, scheduled for sonohyst and explained procedure to pt. Pre-collect email sent.

## 2023-06-12 ENCOUNTER — Encounter: Admit: 2023-06-12 | Payer: PRIVATE HEALTH INSURANCE | Primary: Internal Medicine

## 2023-06-12 ENCOUNTER — Encounter: Admit: 2023-06-12 | Payer: PRIVATE HEALTH INSURANCE | Attending: Obstetrics and Gynecology | Primary: Internal Medicine

## 2023-06-18 ENCOUNTER — Encounter: Admit: 2023-06-18 | Payer: PRIVATE HEALTH INSURANCE | Attending: Obstetrics and Gynecology | Primary: Internal Medicine

## 2023-07-09 ENCOUNTER — Encounter: Admit: 2023-07-09 | Payer: PRIVATE HEALTH INSURANCE | Primary: Internal Medicine

## 2023-07-09 MED ORDER — METOPROLOL SUCCINATE ER 50 MG TABLET,EXTENDED RELEASE 24 HR
50 | ORAL_TABLET | Freq: Every day | ORAL | 4 refills | 30.00000 days | Status: AC
Start: 2023-07-09 — End: ?

## 2023-07-10 ENCOUNTER — Encounter: Admit: 2023-07-10 | Payer: PRIVATE HEALTH INSURANCE | Primary: Internal Medicine

## 2023-07-10 ENCOUNTER — Encounter: Admit: 2023-07-10 | Payer: PRIVATE HEALTH INSURANCE | Attending: Obstetrics and Gynecology | Primary: Internal Medicine

## 2023-07-14 ENCOUNTER — Encounter: Admit: 2023-07-14 | Payer: PRIVATE HEALTH INSURANCE | Attending: "Endocrinology | Primary: Internal Medicine

## 2023-07-14 DIAGNOSIS — E0500 Thyrotoxicosis with diffuse goiter without thyrotoxic crisis or storm: Secondary | ICD-10-CM

## 2023-07-14 LAB — T3: T3 TOTAL: 84 ng/dL (ref 76–181)

## 2023-07-14 LAB — TSH: TSH, 3RD GENERATION: 6.73 m[IU]/L — ABNORMAL HIGH (ref 0.40–4.50)

## 2023-07-14 LAB — T4, FREE: FREE T4: 0.7 ng/dL — ABNORMAL LOW (ref 0.8–1.8)

## 2023-07-14 MED ORDER — METHIMAZOLE 5 MG TABLET
5 | ORAL_TABLET | Freq: Every day | ORAL | 4 refills | 48.50000 days | Status: AC
Start: 2023-07-14 — End: ?

## 2023-07-14 NOTE — Progress Notes
 Labs reviewed:Lab Results Component Value Date  TSH <0.005 (L) 11/28/2022  TSH, 3RD GENERATION 6.73 (H) 07/13/2023  TSH, 3RD GENERATION W/ REFLEX TO FT4 0.02 (L) 11/24/2022  FREE T4 0.7 (L) 07/13/2023  T3 TOTAL 84 07/13/2023  T3 FREE 7.2 11/28/2022  THYROGLOBULIN (ROCHE) 112.0 (H) 11/27/2022  THYROGLOBULIN AB (ROCHE) <15.0 11/27/2022  THYROID PEROXIDASE AB 28 (H) 04/20/2023  Current RX:  Methimazole  10 mg daily Recommendations:- DECREASE methimazole  to 5 mg daily. - repeat labs in 4-6 weeks, all orders placed

## 2023-07-26 MED ORDER — DULOXETINE 60 MG CAPSULE,DELAYED RELEASE
60 | ORAL_CAPSULE | ORAL | 5 refills | 90.00000 days | Status: AC
Start: 2023-07-26 — End: ?

## 2023-07-29 ENCOUNTER — Encounter: Admit: 2023-07-29 | Payer: PRIVATE HEALTH INSURANCE | Primary: Internal Medicine

## 2023-08-03 ENCOUNTER — Inpatient Hospital Stay: Admit: 2023-08-03 | Discharge: 2023-08-03 | Payer: PRIVATE HEALTH INSURANCE | Primary: Internal Medicine

## 2023-08-03 DIAGNOSIS — I4891 Unspecified atrial fibrillation: Secondary | ICD-10-CM

## 2023-08-03 DIAGNOSIS — I517 Cardiomegaly: Secondary | ICD-10-CM

## 2023-08-03 MED ORDER — PERFLUTREN LIPID MICROSPHERES 1.3 ML/10 ML NS INJ (IN
Freq: Once | INTRAVENOUS | Status: CP | PRN
Start: 2023-08-03 — End: ?
  Administered 2023-08-03: 11:00:00 10.000 mL via INTRAVENOUS

## 2023-08-05 ENCOUNTER — Encounter: Admit: 2023-08-05 | Payer: PRIVATE HEALTH INSURANCE | Attending: Cardiovascular Disease | Primary: Internal Medicine

## 2023-08-05 NOTE — Other
 EF normalized out of a.fib, sent mychart

## 2023-08-07 ENCOUNTER — Ambulatory Visit: Admit: 2023-08-07 | Payer: PRIVATE HEALTH INSURANCE | Primary: Internal Medicine

## 2023-08-07 DIAGNOSIS — R0683 Snoring: Secondary | ICD-10-CM

## 2023-08-10 ENCOUNTER — Encounter: Admit: 2023-08-10 | Payer: PRIVATE HEALTH INSURANCE | Attending: Cardiovascular Disease | Primary: Internal Medicine

## 2023-08-10 ENCOUNTER — Ambulatory Visit: Admit: 2023-08-10 | Payer: PRIVATE HEALTH INSURANCE | Attending: Cardiovascular Disease | Primary: Internal Medicine

## 2023-08-10 VITALS — BP 116/66 | HR 104 | Ht 66.0 in | Wt 267.0 lb

## 2023-08-10 DIAGNOSIS — R002 Palpitations: Secondary | ICD-10-CM

## 2023-08-10 DIAGNOSIS — R55 Syncope and collapse: Secondary | ICD-10-CM

## 2023-08-10 DIAGNOSIS — F909 Attention-deficit hyperactivity disorder, unspecified type: Secondary | ICD-10-CM

## 2023-08-10 DIAGNOSIS — IMO0001 Reflux: Secondary | ICD-10-CM

## 2023-08-10 DIAGNOSIS — I4891 Unspecified atrial fibrillation: Secondary | ICD-10-CM

## 2023-08-10 DIAGNOSIS — J189 Pneumonia, unspecified organism: Secondary | ICD-10-CM

## 2023-08-10 DIAGNOSIS — I10 Essential (primary) hypertension: Secondary | ICD-10-CM

## 2023-08-10 DIAGNOSIS — K219 Gastro-esophageal reflux disease without esophagitis: Secondary | ICD-10-CM

## 2023-08-10 DIAGNOSIS — F341 Dysthymic disorder: Secondary | ICD-10-CM

## 2023-08-10 DIAGNOSIS — E785 Hyperlipidemia, unspecified: Secondary | ICD-10-CM

## 2023-08-10 DIAGNOSIS — I499 Cardiac arrhythmia, unspecified: Secondary | ICD-10-CM

## 2023-08-10 NOTE — Progress Notes
 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  She had new dyspnea on exertion during the subacute symptoms preceding this hospital course.  She was started on methimazole  and propranolol .  Echo obtained, which was low normal LVEF 50-55%, and no significantly valvular abnormalities, and mildly dilated LAFollowing the hospitalization she had ongoing fatigue/DOE, felt to be possibly related to a.fib and eventually she underwent successful DCCV which was successful with resolution of her dyspnea and much of her fatigue, though some fatigue remained.  She had some palpitations at night time following DCCV, but nothing sustained or worrisome to her, so she opted out of a repeat monitor. 07/2023:  Feeling well.  No recurrence of symptoms.  Exercising more.  Feeling well.  Weight loss has been stagnant.  She has been considering GLP 1 RAFamily/social: Social: non-smoker, no EtOH 3 years.  APRN in psychology Family: mother with CAD/afib Past Medical History:Past Medical History: Diagnosis Date  ADHD (attention deficit hyperactivity disorder)   Arrhythmia   occasional palpitation - distant past cleared by ekg  Dysthymia   GERD (gastroesophageal reflux disease)   Heart palpitations   Hypertension   Pneumonia   Reflux   Syncope   distant past - 25+ years ago  Past Surgical History:Past Surgical History: Procedure Laterality Date  ABLATION SAPHENOUS VEIN W/ RFA    LAPAROSCOPIC COLON RESECTION    partial removal of small intestine  URETHRAL SLING    Family Medical History: Social History: No Known AllergiesCurrent Outpatient Medications on File Prior to Visit Medication Sig Dispense Refill  apixaban  (ELIQUIS ) 5 mg tablet Take 1 tablet (5 mg total) by mouth 2 (two) times daily. 90 tablet 3  cholecalciferol , vitamin D3, 25 mcg (1,000 unit) tablet Take 1 tablet (1,000 Units total) by mouth daily.    DULoxetine  (CYMBALTA ) 60 mg capsule Take 1 capsule (60 mg total) by mouth daily. 90 capsule 4  fluocinolone  acetonide oil (DERMOTIC ) 0.01 % ear drops Place into both ears daily as needed.    ketoconazole  (NIZORAL ) 2 % shampoo Apply topically twice a week.    methIMAzole  (TAPAZOLE ) 5 mg tablet Take 1 tablet (5 mg total) by mouth daily. 90 tablet 3  metoprolol  succinate XL (TOPROL -XL) 50 mg 24 hr tablet Take 1 tablet (50 mg total) by mouth daily. Take with or immediately following a meal. 90 tablet 3  metoprolol  tartrate (LOPRESSOR ) 25 mg Immediate Release tablet 1-2 tabs every 12 hours as needed for breakthru heart rate over 100 60 tablet 11  omeprazole  (PRILOSEC) 40 mg capsule take 1 capsule by mouth daily 30 capsule 11 No current facility-administered medications on file prior to visit. Review of Systems ROS 10 point ROS negative except for what is stated in HPI Vital Signs and Physical Exam There were no vitals filed for this visit.Physical Exam Constitutional: She appears healthy. Neck: No JVD present. Cardiovascular: Normal rate, regular rhythm, S1 normal, S2 normal and normal heart sounds. No murmur heard.Pulmonary/Chest: Breath sounds normal. Musculoskeletal:       General: No edema.    Comments: 2+ pitting edema to her shins Neurological: She is alert and oriented to person, place, and time. Laboratory Studies Creatinine Date/Time Value Ref Range Status 04/13/2023 10:30 AM 0.70 0.50 - 1.05 mg/dL Final 88/70/7975 89:79 AM 0.72 0.50 - 1.05 mg/dL Final  11/27/2022 05:19 AM 0.63 0.40 - 1.30 mg/dL Final 89/93/7975 97:75 PM 0.63 0.40 - 1.30 mg/dL Final Platelets Date/Time Value Ref Range Status 04/20/2023 11:30 AM 194 140 - 400 Thousand/uL Final 11/27/2022 05:19 AM 191 150 - 420 x1000/?L Final Cholesterol, Total Date/Time Value Ref Range Status 04/13/2023 10:30 AM 175 <200 mg/dL Final Triglycerides Date/Time Value Ref Range Status 04/13/2023 10:30 AM 130 <150 mg/dL Final HDL Date/Time Value Ref Range Status 04/13/2023 10:30 AM 55 > OR = 50 mg/dL Final LDL Cholesterol Date/Time Value Ref Range Status 04/13/2023 10:30 AM 97 mg/dL (calc) Final   Comment:   Reference range: <100 Desirable range <100 mg/dL for primary prevention;  <70 mg/dL for patients with CHD or diabetic patients with > or = 2 CHD risk factors. LDL-C is now calculated using the Martin-Hopkins calculation, which is a validated novel method providing better accuracy than the Friedewald equation in the estimation of LDL-C. Gladis APPLETHWAITE et al. SANDREA. 7986;689(80): 2061-2068 (http://education.[employer].com/faq/FAQ164) INR Date/Time Value Ref Range Status 05/10/2010 10:23 AM 0.88 0.80 - 1.15 Final Lab Results Component Value Date  HGBA1C 5.6 04/13/2023  HGBA1C 6.4 09/15/2022  HGBA1C 6.1 (H) 01/20/2022  HGBA1C 6.1 01/21/2021  HGBA1C 6.2 11/04/2020  HGBA1C 5.5 02/13/2019  HGBA1C 5.7 08/26/2013  Diagnostic Studies EKG:  Rate controlled atrial fibrillation and nonspecific T-wave abnormalitiesEKG dated 02/14 2025:  Sinus tachycardia abnormal T-wave inversions in the high lateral leads which he was relatively unchanged from a scanned EKG dated 02/15/2023.Non-Cardiac Diagnostic Studies:Cardiac Diagnostic Studies:Transthoracic Echocardiogram:Results for orders placed or performed during the hospital encounter of 11/26/22 Echo 2D Complete w Doppler and CFI if Ind Image Enhancement 3D and or bubbles Result Value Ref Range  Reported Visual Range EF% 50-55 %  Narrative   * Technically limited study.* Normal left ventricular cavity size.  Mild concentric left ventricular hypertrophy.  No regional wall motion abnormalities.  Low normal left ventricular systolic function.  LVEF estimated by visual assessment was between 50-55%.  Diastolic function was difficult to determine due to atrial fibrillation.* Normal right ventricular cavity size and systolic function.  Estimated right ventricular systolic pressure is 18 mmHg.* Left atrium is mildly dilated.  No interatrial shunt by color Doppler.  Right atrium is normal in size.* No significant valvular abnormalities.* No significant pericardial effusion.* No prior study available for comparison. 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  No cardiac testing required.  Stop Eliquis  2 days prior resume when able after.  Paroxymal atrial fib: symptomatic with dyspnea and fatigue, was provoked in context of hyperthyroidism.  Normal LVEF with mildly dilated LA.  CHADS2VASc= 2 (female, HTN).  Once thyroid completely back to normal (still is requiring adjustments of thyroid meds), will plan for long term monitor to see if has any fib once euthymic.  With shared decision making she wants to remain on [facility] for now.  If no AFib on long-term monitor we will consider discontinuing anticoagulation and down titrating metoprolol .- metop 50 - eliquis  5 BID- weight loss - f/u sleep study - regular exercise LE edema: resolved without diuretic therapy, no other HF symptoms, likely venous insufficiency vs. Thyroid related- keep legs elevated, cont compression stockings,Family history CAD/prevention: T chol 175, HDL 55, trig 130, LDL 97- CAC scoreFollow-up in 8 Earle Land, MD Assistant Professor of MedicineSection of Cardiology [facility] Division of MedicineScribed for Land Barter, MD by Jesusita  Emmy Kearns, medical scribe June 10, 2025The documentation recorded by the scribe accurately reflects the services I personally performed and the decisions made by me. I reviewed and confirmed all material entered and/or pre-charted by the scribe.

## 2023-08-14 ENCOUNTER — Encounter: Admit: 2023-08-14 | Payer: PRIVATE HEALTH INSURANCE | Attending: Cardiovascular Disease | Primary: Internal Medicine

## 2023-08-14 MED ORDER — OMEPRAZOLE 40 MG CAPSULE,DELAYED RELEASE
40 | ORAL_CAPSULE | ORAL | 12 refills | 90.00000 days | Status: AC
Start: 2023-08-14 — End: ?

## 2023-08-15 MED ORDER — APIXABAN 5 MG TABLET
5 | ORAL_TABLET | Freq: Two times a day (BID) | ORAL | 4 refills | 30.00000 days | Status: AC
Start: 2023-08-15 — End: ?

## 2023-08-16 ENCOUNTER — Encounter: Admit: 2023-08-16 | Payer: PRIVATE HEALTH INSURANCE | Attending: Internal Medicine | Primary: Internal Medicine

## 2023-08-16 ENCOUNTER — Encounter: Admit: 2023-08-16 | Payer: PRIVATE HEALTH INSURANCE | Primary: Internal Medicine

## 2023-08-16 NOTE — Procedures
 [address] [place], [place] [id number] [address] ([address]), [place], [place] [address]E[place]wz: [phone]     Fax: [phone] Mercer Hector, MD -Arlyne Kato, MD - Antonina Stank, MD - Eleanor Shine, MD, PhD -            	Redell Keto, MD - Janis Argyle, MD, - Devere Slay PhD - Alana Redwood, PsyD, - Holmes Led, MD -  Tinnie Zena COME - Redell Carrel, MD  - Hadassah Burnet, PA - Sherrilee Lash, [place] -  Trinna Haller, MD - [name], MD, MS, Medical Director - HILARIO Kendall Flack, MD, MPH, Director    Diagnostic Polysomnography Interpretation:Patient Name: Kristie Frye Referring Provider: Prentice Land, MD Date of Birth: 1960/03/23 Primary Care Provider: Myla Shelter, MD Date of Service: 08/07/2023 Sleep Consultant: Sherrilee Lash, [place] [facility] MRN: [id number] Recording Tech: Stephane Brazen, SDS RPSGT   Scoring Tech: Mitzie Camara, RPSGT INDICATION: This 63 year old female was referred for polysomnography to evaluate symptoms of snoring, witnessed apnea, insomnia/sleep disruption, and unrefreshing sleep/daytime sleepiness. The Epworth Sleepiness scale is 5. BMI is 38.74. PAST MEDICAL HISTORY:Kristie Frye  has a past medical history of ADHD (attention deficit hyperactivity disorder), Arrhythmia, Dysthymia, GERD (gastroesophageal reflux disease), Heart palpitations, Hypertension, Pneumonia, Reflux, and Syncope.MEDICATIONS:Current Medications[1]TEST CONDITIONS:Lights out 9:48:29 PM. Lights on 5:00:59 AM.FIO2:  21%KEY FINDINGS (DETAILED DATA BELOW):Sleep:  The Total-Sleep Time and Sleep Efficiency were 329.5 minutes and 76%, respectively including 0.0 minutes of REM sleep and 7.0 minutes of supine sleep. The sleep latency was 36.0 minutes. REM latency was NONE minutes. There were N/A REM periods. Sleep was fragmented with an arousal and awakening index of 64.8/hr. Time spent in wake-after-sleep onset (WASO) was 67.0 minutes.  Breathing: The diagnostic Apnea-Hypopnea Index (AHI) was 66.3/hr. (AHI 4% of 53.0/hr.). The REM AHI was N/A/hr. The supine AHI was 111.4/hr. The CAI was 0.0/hr.The CAHI was 0.2/hr. With the patient awake and breathing room air, the average arterial oxygen saturation was 91%. During NREM and REM sleep the average arterial oxygen saturation was 91% and N/A%, respectively with a nadir arterial oxygen saturation of 84%. Time spent below 89% oxygen saturation was 21.2 minutes or 6.4% of total recording time.  Cardiac: Cardiac rhythm was sinus rhythm. Mean sleep heart rate was 73 bpm.  Movements: Periodic limb movements in sleep were observed. There were 8 periodic limb movements during sleep for a periodic limb movement index (PLMI) of 1.5. The number of arousals related to periodic limb movements (PLMAI) of 1.1.IMPRESSION: Obstructive Sleep Apnea (G47.33):  AHI of 66.3/hr. (AHI 4% of 53.0/hr.), mean SaO2 91%, nadir SaO2 84%, 6.4% total recording time with SpO2<88%. No evidence of hypoventilation on transcutaneous carbon dioxide monitoring. RECOMMENDATIONS: Current Curator of Physicians recommendations for treatment of obstructive sleep apnea include 1) weight loss in overweight patients, 2) positive airway pressure (PAP) as initial therapy, and 3) oral appliance therapy as alternative therapy for select patients. Additional treatment options in select patients include nasal expiratory resistance device therapy (EPAP), airway surgery, and positional therapy (i.e. avoiding the supine sleep position).Auto-titrating positive airway pressure (APAP) therapy 5-20 cmH2O has been ordered. After the initiation of PAP therapy the patient will have close follow up wit their durable medical equipment company to assist in the acclimation to therapy and will then follow up with their sleep provider. Electronically Signed ab:Gjrvlzopwz Armen Kato, MD, MHS, FCCPAttending Physician, Pulmonary, Critical Care, and Sleep MedicineYale School of MedicineNPI: [id number] ELECTRONICALLY SIGNED BY Dr. Arlyne Kato, August 16, 2023 at 1:41  PMDETAILED DATA:  tables and figures are best viewed in notes tab or encounters tab in electronic medical record rather than procedure tab.Sleep Times  Sleep Data  Lights Out: 9:48:29 PM Sleep Latency: 36.0 min. Lights On: 5:00:59 AM REM Latency: NONE min. Sleep Onset: 10:24:29 PM Wake After Sleep Onset (WASO): 67.0 min. Total Recording Time (TRT): 432.5 min. (7.2 hrs.) Sleep Efficiency: 76% Sleep Period Time (SPT): 396.5 min. (6.6 hrs.) Sleep Maintenance Efficiency: 83% Total Sleep Time (TST): 329.5 min. (5.5 hrs.)   Sleep Stages Time (min) % TST Arousals and Awakenings  TST 329.5 min.  Total Awakenings: 23 NREM 329.5 min. 100% Awakening Index: 4.2   N1 10.0 min. 3% Total Arousals: 333   N2 252.0 min. 76% Arousal Index: 60.6   N3 67.5 min. 20%   REM 0.0 min. 0%   Wake 103.0 min.    Respiratory DataAHI: 66.3 CMS AHI: 53.0 RDI: 66.3 CAHI TST: 0.2 AHI REM: N/A CMS AHI REM: N/A RDI REM: N/A CAHI REM:  N/A AHI NREM: 66.3 CMS AHI NREM: 53.0 RDI NREM: 66.3 CAHI NREM: 0.2 Apnea + Hypopnea Events: 364 CMS A+H Events: 291 RERA Events: 0 Central A+H Events: 1 Apnea + Hypopnea Events REM: N/A CMS A+H Events REM: 0 RERA Events REM: N/A Central A+H REM Events: 0 Apnea + Hypopnea Events NREM: 364 CMS A+H Events NREM: 291 RERA Events NREM: 0 Central A+H Events NREM: 1  Central Apnea Obstructive Apnea Mixed Apnea Hypopnea Central Hypopnea RERA Events 0 44 0 319 1 0 Max. Duration (sec.) 0.0 24.0 0.0 34.0 10.0 0.0 Mean Duration (sec.) 0.0 12.1 0.0 18.5 10.0 0.0 Total Index 0.0 8.0 0.0 58.1 0.2 0.0 Body Position DataPosition Time REM NREM Apnea/Hypopnea AHI Mean SpO2 % Snore  (min.) (min.) (min.) C / O / M / [place] / [place]  Sleep Index Supine 12.1 0.0 7.0 0 / 3 / 0 / 10 / 1 111.4 90% 0.0 Left 98.8 0.0 40.5 0 / 4 / 0 / 15 / 0 28.1 92% 0.0 Prone 0.0 0.0 0.0 N/A / N/A / N/A / N/A / N/A N/A N/A% N/A Right 321.6 0.0 282.0 0 / 37 / 0 / 294 / 1 70.6 91% 2.6 Upright 0.0 0.0 0.0 N/A / N/A / N/A / N/A / N/A N/A N/A% N/A Transcutaneous CO2 Data TRT TST REM NREM Wake Mean (mmHg) 39.7 40.3 N/A 40.3 37.5 Max (mmHg) 43.0 43.0 N/A 43.0 43.0 Min (mmHg) 0.0 36.0 N/A 36.0 0.0       Time > 55 mmHg 0.0 min. 0.0 min. 0.0 min. 0.0 min. 0.0 min. Oximetry Data TRT TST REM NREM Wake Mean SpO2 %: 91% 91% N/A% 91% 92% Min SpO2 %: 84% 84% N/A% 84% 84%  TRT TST REM NREM Wake ODI (3%): 51 66.6 N/A 66.6 3 Time < 88%: 24.1 min. 21.2 min. 0.0 min. 21.2 min. 3.0 min.  TRT TST REM NREM Wake 90 - 100%: 346 min. 257 min. 0 min. 257 min. 89 min. 80 - 89%: 78 min. 72 min. 0 min. 72 min. 6 min. 70 - 79%: 0 min. 0 min. 0 min. 0 min. 0 min. <70%: 0 min. 0 min. 0 min. 0 min. 0 min. Cardiac Data TRT TST REM NREM Wake Mean Heart Rate: 73 bpm 73 bpm N/A bpm 73 bpm 73 bpm Low Heart Rate: 55 bpm 55 bpm N/A bpm 55 bpm 58 bpm High Heart Rate: 102 bpm 92 bpm N/A bpm 92 bpm 102 bpm Arousal Data TST REM  NREM Arousal Index: 60.6 N/A 60.6 Total Arousals: 333 N/A 333 Arousals with Respiratory Events: 220 N/A 220 Arousals with LM Events (Excl PLM): 12 0 12 Arousals with PLM Events: 6 N/A 6 Arousals with Snoring Events: 1 N/A 1 Non-Specific Arousals: 94 N/A 94 Periodic Limb Movement Data LM Count LM Index PLM Count PLM Index Total Sleep: 41 7.5 8 1.5 Wake: 1 0.6 0 0.0 Stage N1: 0 0.0 0 0.0 Stage N2: 37 8.8 8 1.9 Stage N3: 4 3.6 0 0.0 REM: N/A N/A N/A N/A Snoring Data TST REM NREM Total Snore Events with Arousals: 1 N/A 1 Total Snore Occurrences: 12 N/A 12 Total Snoring Time (min): 12.2 0.0 12.2 Snoring Index: 2.2 N/A 2.2 Graphical Data [1] Current Outpatient Medications:   apixaban  (ELIQUIS ) 5 mg tablet, Take 1 tablet (5 mg total) by mouth 2 (two) times daily., Disp: 90 tablet, Rfl: 3  cholecalciferol , vitamin D3, 25 mcg (1,000 unit) tablet, Take 1 tablet (1,000 Units total) by mouth daily., Disp: , Rfl:   DULoxetine  (CYMBALTA ) 60 mg capsule, Take 1 capsule (60 mg total) by mouth daily., Disp: 90 capsule, Rfl: 4  fluocinolone  acetonide oil (DERMOTIC ) 0.01 % ear drops, Place into both ears daily as needed., Disp: , Rfl:   ketoconazole  (NIZORAL ) 2 % shampoo, Apply topically twice a week., Disp: , Rfl:   methIMAzole  (TAPAZOLE ) 5 mg tablet, Take 1 tablet (5 mg total) by mouth daily., Disp: 90 tablet, Rfl: 3  metoprolol  succinate XL (TOPROL -XL) 50 mg 24 hr tablet, Take 1 tablet (50 mg total) by mouth daily. Take with or immediately following a meal., Disp: 90 tablet, Rfl: 3  metoprolol  tartrate (LOPRESSOR ) 25 mg Immediate Release tablet, 1-2 tabs every 12 hours as needed for breakthru heart rate over 100, Disp: 60 tablet, Rfl: 11  omeprazole  (PRILOSEC) 40 mg capsule, take 1 capsule by mouth daily, Disp: 30 capsule, Rfl: 11

## 2023-08-17 ENCOUNTER — Encounter: Admit: 2023-08-17 | Payer: PRIVATE HEALTH INSURANCE | Attending: Internal Medicine | Primary: Internal Medicine

## 2023-08-27 NOTE — Progress Notes
 [name] NOTEProvider: Ezella Dearth, MDDate of service: 2/24/2025Name: Kristie Frye (DOB: 17-Jan-1961)AGE: 63 y.o. PCP: Larayne Forward of last visit: 11/6/2024Date of initial evaluation: 11/26/2022 in hospital settingREASON FOR REFERRAL/ FOLLOW UP: thyrotoxicosis in setting of new Afib/ Sinus tachycardiaDiana E Frye is a 63 year old female with past medical history significant for ADHD, atrial fibrillation, arrhythmia, dysthymia, GERD, heart palpitations, hypertension, pneumonia, and syncope. INTERIM HISTORY: The originally scheduled video visit was changed to telephone one due to patient being unable to connect through video platforms / poor connection. - 12/29/2022 - s/p cardioversion. -  11/26/2022 - methimazole  20 mg daily.   12/27/2022 - methimazole  10 mg daily.   01/22/2023 - methimazole  7.5 mg daily.- 11/2022 - Thyroid US : L 1 cm thyroid nodule, TR3.- She reports occasional, brief 30 min episodes of afib, treated w/ metoprolol .- Remains on Eliquis  Rx.- f/b cardiologist Dr. Nelida, next in 07/2023 w/ echo- She reports hair loss.- Postmenopausal since mid 40s.- Taking MVT and vit D.CURRENT TREATMENT: methimazole  7.5 mg daily.Date TSH Free T4 Total T3 TSI  TPO Ab TRAB Methimazole  11/24/2022 0.02 2.9      11/26/2022   376 3.8 86 5.39  11/28/2022 <0.005 2.69     20 mg daily 12/20/2022 0.01 1.0 128    20 mg daily 12/2022 0.06 0.9 127    10 mg daily 11/27/2022 - thyroid ultrasound: Isthmus: Measure 0.2 cm. No focal lesions.Right thyroid lobe: 5.2 x 1.5 x 1.7 cm.Heterogenous.Left thyroid lobe: 4.8 x 1.7 x 1.4 cm.Echotexture: Heterogenous.Vascularity on color Doppler: Normal.LEFT: A 1.0 x 0.8 x 0.9 cm superior lobe nodule is solid, hyperechoic, not taller than wide, with smooth borders. TR 3. Recommendation: IMPRESSION:1. Heterogenous echogenicity of the thyroid gland suggests thyroiditis. Thyroid is not markedly enlarged. No significant hyperemia on color flow imaging.2. Left TR3 nodule for which no follow-up is required per TIRADS criteria. HISTORY OF PRESENT ILLNESS (11/2022): - 11/26/2022 - Patient presented to ED with HR up to 140s with palpitations, BP was stable. TSH was < 0.0005, T4 was 2.69. New diagnosis of moderate thyrotoxicosis in a patient with newly Afib/ Sinus tachycardia, LE swelling.- TSH not completely suppressed, but very low:    Lab Results Component Value Date   TSH 4.020 05/03/2017   TSH, 3RD GENERATION W/ REFLEX TO FT4 0.02 (L) 11/24/2022   FREE T4 2.9 (H) 11/24/2022   T3 TOTAL 376.0 (H) 11/26/2022   THYROID PEROXIDASE AB 86 (H) 11/26/2022 - 11/27/2022 - thyroid US  showed b/l heterogenous echogenicity of the thyroid gland suggesting thyroiditis. Thyroid is not markedly enlarged. There is no significant hyperemia on color flow imaging.- Labs reviewed. Liver panel - WNL. CBC - WBC of 3.6. - Recommendations on discharge:1. Due to Afib and moderate degree of free T4 / Total T3 elevation, start methimazole  20 mg daily.2. Monitor TFTs weekly as still concerned about possibility of thyroiditis as an etiology of thyrotoxicosis. Methimazole  	titration per TFTs.3. Afib Rx per cardiology. Very likely in the setting of thyrotoxicosis. Best Rx - beta blockers, doses are typically are high to get HR controlled. 4. Follow up at [facility] in 4-6 weeks.-11/30/2022 - evaluation by cardiologist Dr. Nelida for hospital d/c followup for Afib. Recommendations for Holter monitor, f/u labs, and DCCV pending results. Continue Eliquis  and propranolol . Once euthyroid, plan for monitor to assess for long term Afib control. RTC 4 months.CURRENT TREATMENT: methimazole  20 mg daily.Date TSH Free T4 Total T3 TSI  TPO Ab TRAB Methimazole  11/24/2022 0.02 2.9      11/26/2022   376  3.8 86 5.39  11/28/2022 <0.005 2.69     20 mg daily 12/20/2022 0.01 1.0 128    20 mg daily - 11/27/2022 - thyroid ultrasound: Isthmus: Measure 0.2 cm. No focal lesions.RIGHT:Right thyroid lobe: 5.2 x 1.5 x 1.7 cm.Echotexture: Heterogenous.Nodules/lesions: None.LEFT:Left thyroid lobe: 4.8 x 1.7 x 1.4 cm.Echotexture: Heterogenous.Nodules/lesions: A 1.0 x 0.8 x 0.9 cm superior lobe nodule is solid, hyperechoic, not taller than wide, with smooth borders, and no echogenic foci were calcifications. Total points: 3. TI-RADS level: TR 3. Recommendation: Vascularity on color Doppler: Normal. IMPRESSION:1. Bilateral heterogenous echogenicity of the thyroid gland suggests thyroiditis. Thyroid is not markedly enlarged. There is no significant hyperemia on color flow imaging.2. Left TR3 nodule for which no follow-up is required per TIRADS criteria.  PAST MEDICAL HISTORY: Past Medical History: Diagnosis Date  ADHD (attention deficit hyperactivity disorder)   Arrhythmia   occasional palpitation - distant past cleared by ekg  Dysthymia   GERD (gastroesophageal reflux disease)   Heart palpitations   Hypertension   Pneumonia   Reflux   Syncope   distant past - 25+ years ago PAST SURGICAL HISTORY: She has a past surgical history that includes Ablation saphenous vein w/ RFA; Urethral sling; and Laparoscopic colon resection.ALLERGIES: Patient has no known allergies.FAMILY HISTORY:  Negative for endocrine diseases. Specifically, no thyroid, parathyroid, adrenal, pituitary or other neuroendocrine diseases or neoplasms.SOCIAL HISTORY: The patient does not smoke or drink excess alcohol and denies any toxic habits or use of illicit substances. Currently she is a Biomedical engineer. There are no pertinent occupational exposures. There is no history of excess radiation exposure to thyroid region or sella.  MEDICATIONS:Current Medications Medication Sig  albuterol  sulfate 90 mcg/actuation HFA aerosol inhaler INHALE 2 PUFFS INTO THE LUNGS EVERY 6 HOURS AS NEEDED FOR WHEEZING  apixaban  (ELIQUIS ) 5 mg tablet Take 1 tablet (5 mg total) by mouth 2 (two) times daily.  cholecalciferol , vitamin D3, 25 mcg (1,000 unit) tablet Take 1 tablet (1,000 Units total) by mouth daily.  DULoxetine  (CYMBALTA ) 60 mg capsule take 1 capsule by mouth daily  ketoconazole  (NIZORAL ) 2 % shampoo Apply topically twice a week.  methIMAzole  (TAPAZOLE ) 5 mg tablet Take 1.5 tablets (7.5 mg total) by mouth daily.  metoprolol  succinate XL (TOPROL -XL) 50 mg 24 hr tablet Take 1 tablet (50 mg total) by mouth daily. Take with or immediately following a meal.  metoprolol  tartrate (LOPRESSOR ) 25 mg Immediate Release tablet 1-2 tabs every 12 hours as needed for breakthru heart rate over 100  omeprazole  (PRILOSEC) 40 mg capsule take 1 capsule by mouth daily PHYSICAL EXAMINATION: LMP 02/02/2013 (Exact Date) Not done as the visit was conducted through telemedicine. LABORATORY DATA:  Laboratory data reviewed and discussed with the patient.  Pertinent dictated in the History of Present Illness.ASSESSMENT AND PLAN:   1.	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  Thyroid not enlarged. No graves opthalmopathy.  2024- TPO Abs / TRAB / TSI elevated.11/2022 - Thyroid US : Bilateral heterogenous echogenicity. No significant hyperemia on color flow imaging.Most likely dealing with Graves disease.  2. 	Thyrotoxicosis/Graves Disease Rx: methimazole  20 mg daily started 11/2022. 12/20/2022 - TFTs in euthyroid range. TSH low at 0.01, but typically lags behind. Remarkable improvement.12/2022 - Almost euthyroid w/ TFTs within euthyroid range. Continue methimazole  7.5 mg daily. Await new TFTs to readjust dose.Monitor TFTs every 3 months x 4 to assure TSH at goal and to readjust methimazole  dose as needed.Monitor TSI/TRAB levels every 3-6 months. Medical treatment with methimazole  will be continued for at least  18-24 months.No indications for RAIT. No indications for surgical treatment. 3. 	Small thyroid nodule. 11/2022- A 1.0 x 0.8 x 0.9 cm superior lobe nodule is solid, hyperechoic, TR 3.Repeat thyroid US  at [facility] once in 1-2 years, fall 2025. Avoid TSH > 4. 4.          Afib. Fatigue. Muscle weakness. S/p cardioversion 11/2022.12/2022 - TFTs in euthyroid range. Remarkable improvement.As TFTs in euthyroid range, other agents than beta blockers can be used for Afib/ HTN Rx. Rx of Afib per Dr. Nelida. 5.	Followup in 6 months. If there are positive findings in the ROS, the patient has been referred back to PCP.  The patient verbalized understanding of our recommendations regarding further evaluation and treatment.  The importance of taking all medications as prescribed was emphasized as was the need to attend all recommended future imaging studies, laboratory testing, referrals and followup visits. Total time spent by the provider on the day of service, which includes time spent on pre-charting and chart review, in-person interaction with the patient, education, coordination of care/services and counseling on the diagnosis, recommendations and plan of care, also, dictation/ editing the consultative note, medications refill, laboratory / diagnostic tests orders was 25  min. Jaimie Redditt, M.D.Assistant Professor of Clinical Medicine, [employer] of MedicineDepartment of Internal Medicine, Section of EndocrinologyScribed for Ricki Ezella GAILS, MD by Jeoffrey Pali, medical scribe February 24, 2025The documentation recorded by the scribe accurately reflects the services I personally performed and the decisions made by me. I reviewed and confirmed all material entered and/or pre-charted by the scribe.TELEPHONE VISIT: For this visit the clinician and patient were present via telephone (audio only).Patient counseled on available options for visit type; Patient elected telephone visit (audio only); Patient consent given for telephone (audio only) visit : YesState patient is located in: [place]The clinician is appropriately licensed in the above state to provide care for this visitPatient Identity was confirmed during this call.  Other individuals actively participating in the telephone encounter and their name/relation to the patient: noneGreater than 10 minutes of time was spent with this patient in medical discussion to support the telephone (audio only) visit: Yes The level of service was determined by medical decision making (MDM).Because this visit was completed over telephone, a hands-on physical exam was not performed.  Patient understands and knows to call back if condition changes.

## 2023-09-07 ENCOUNTER — Inpatient Hospital Stay: Admit: 2023-09-07 | Discharge: 2023-09-07 | Payer: PRIVATE HEALTH INSURANCE | Primary: Internal Medicine

## 2023-09-07 ENCOUNTER — Encounter: Admit: 2023-09-07 | Payer: PRIVATE HEALTH INSURANCE | Attending: Cardiovascular Disease | Primary: Internal Medicine

## 2023-09-07 DIAGNOSIS — E785 Hyperlipidemia, unspecified: Principal | ICD-10-CM

## 2023-09-07 NOTE — Other
 CAC score 10, sent mychart

## 2023-09-09 ENCOUNTER — Encounter: Admit: 2023-09-09 | Payer: PRIVATE HEALTH INSURANCE | Attending: "Endocrinology | Primary: Internal Medicine

## 2023-09-09 NOTE — Other
 If possible, please, try adding : 1) TSI and 2) 25 vit D levels to the labs, Labs done at [facility]. Kristie Frye, Ezella

## 2023-09-13 LAB — TSH: TSH, 3RD GENERATION: 1.49 m[IU]/L (ref 0.40–4.50)

## 2023-09-13 LAB — TEST AUTHORIZATION     (LMW Q)

## 2023-09-13 LAB — TEST AUTHORIZATION 2     (Q)

## 2023-09-13 LAB — T3: T3 TOTAL: 107 ng/dL (ref 76–181)

## 2023-09-13 LAB — VITAMIN D, 25-HYDROXY: VITAMIN D, 25 OH, TOTAL: 31 ng/mL (ref 30–100)

## 2023-09-13 LAB — THYROID STIMULATING IMMUNOGLOBULIN

## 2023-09-13 LAB — T4, FREE: FREE T4: 1 ng/dL (ref 0.8–1.8)

## 2023-10-01 ENCOUNTER — Ambulatory Visit: Admit: 2023-10-01 | Payer: PRIVATE HEALTH INSURANCE | Attending: "Endocrinology | Primary: Internal Medicine

## 2023-10-01 ENCOUNTER — Encounter: Admit: 2023-10-01 | Payer: PRIVATE HEALTH INSURANCE | Attending: "Endocrinology | Primary: Internal Medicine

## 2023-10-01 VITALS — BP 132/80 | HR 77 | Temp 97.30000°F | Resp 18 | Wt 274.0 lb

## 2023-10-01 DIAGNOSIS — E0500 Thyrotoxicosis with diffuse goiter without thyrotoxic crisis or storm: Principal | ICD-10-CM

## 2023-10-01 DIAGNOSIS — F909 Attention-deficit hyperactivity disorder, unspecified type: Secondary | ICD-10-CM

## 2023-10-01 DIAGNOSIS — E559 Vitamin D deficiency, unspecified: Secondary | ICD-10-CM

## 2023-10-01 DIAGNOSIS — E041 Nontoxic single thyroid nodule: Secondary | ICD-10-CM

## 2023-10-01 DIAGNOSIS — R55 Syncope and collapse: Principal | ICD-10-CM

## 2023-10-01 DIAGNOSIS — I4891 Unspecified atrial fibrillation: Secondary | ICD-10-CM

## 2023-10-01 DIAGNOSIS — F341 Dysthymic disorder: Secondary | ICD-10-CM

## 2023-10-01 DIAGNOSIS — R002 Palpitations: Secondary | ICD-10-CM

## 2023-10-01 DIAGNOSIS — I10 Essential (primary) hypertension: Secondary | ICD-10-CM

## 2023-10-01 DIAGNOSIS — IMO0001 Reflux: Secondary | ICD-10-CM

## 2023-10-01 DIAGNOSIS — J189 Pneumonia, unspecified organism: Secondary | ICD-10-CM

## 2023-10-01 DIAGNOSIS — K219 Gastro-esophageal reflux disease without esophagitis: Secondary | ICD-10-CM

## 2023-10-01 DIAGNOSIS — I499 Cardiac arrhythmia, unspecified: Secondary | ICD-10-CM

## 2023-10-01 DIAGNOSIS — H5789 Other specified disorders of eye and adnexa: Secondary | ICD-10-CM

## 2023-10-01 MED ORDER — METHIMAZOLE 5 MG TABLET
5 | ORAL_TABLET | Freq: Every day | ORAL | 4 refills | 30.00000 days | Status: AC
Start: 2023-10-01 — End: ?

## 2023-10-01 MED ORDER — MULTIVITAMIN WITH MINERALS TABLET
Freq: Every day | ORAL | 12.00 refills | 30.00000 days | Status: AC
Start: 2023-10-01 — End: ?

## 2023-10-01 NOTE — Progress Notes
 [place] [facility] ENDOCRINE CLINIC NOTEProvider: Ezella Dearth, MDDate of service: 8/11/2025Name: Kristie Frye (DOB: 28-Oct-1960)AGE: 63 y.o. 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  INTERIM HISTORY: - 04/23/2023 - MyChart message -  Increase methimazole  to 10 mg daily. - 05/25/2022 - initial evaluation with Sherrilee Lash, sleep medicine for snoring. - 07/14/2023 - MyChart message - Decrease methimazole  to 5 mg daily.  - 08/03/2023 - Had an echo. - 08/07/2023 - underwent sleep study which showed severe sleep apnea. - 08/10/2023 - f/u with DR. Prentice Land, cardiologist. Stable, continue same medications. RTC in 8 months. - 09/07/2023 - Had a May cardiac scoring wo IV contrast which showed mild coronary artery calcium with an Agatson score of 104. TODAY (10/01/2023):- Patient traveled from [place] to attend the visit today. - Patient reports doing well, no active complaints. - Reviewed recent thyroid labs from 08/2023 with the patient during the visit today. - Reviewed Thyroid US  imaging from 11/2022 with the patient during the visit today. 11/2022 - Thyroid US : L 1 cm thyroid nodule, TR3.- Reviewed medications with the patient during the visit today. - Patient inquired about if taking methimazole  5 mg for lifelong. - Patient has life-long anxiety / depression since her age 58. - She retired from work in Fall 2024.- Reports low energy level, fatigue, weight re-gain since methimazole  Rx started- Contemplated exercising at home. Recently got a treadmill at home. - Concerned about eyes swelling, thinks could be due to Graves, no other symptoms. - No neck discomfort/ pain or dysphagia. REPRODUCTIVE HISTORY:- Postmenopausal since mid 40s.- Never done Dexa scan before. CURRENT TREATMENT: methimazole  5 mg daily. (Rx started 11/2022).Date TSH Free T4 Total T3 TSI  TPO Ab TRAB Methimazole  11/24/2022 0.02 2.9      11/26/2022   376 3.8 86 5.39  11/28/2022 <0.005 2.69     20 mg daily 12/20/2022 0.01 1.0 128    20 mg daily 12/2022 0.06 0.9 127    10 mg daily 03/2023 <0.01 1.4 185 349 28  7.5 mg daily 06/2023 6.73 0.7 84    10 mg daily 08/2023 1.49 1.0 107    5 mg daily Thyroid Function test (08/2023): TSH 1.49, Free T4 1.0, Total T3 107. 11/27/2022 - Thyroid US : Isthmus: Measure 0.2 cm. No focal lesions.Right thyroid lobe: 5.2 x 1.5 x 1.7 cm.Heterogenous.Left thyroid lobe: 4.8 x 1.7 x 1.4 cm.Echotexture: Heterogenous.Vascularity on color Doppler: Normal.LEFT: A 1.0 x 0.8 x 0.9 cm superior lobe nodule is solid, hyperechoic, not taller than wide, with smooth borders. TR 3. Recommendation: IMPRESSION:1. Heterogenous echogenicity of the thyroid gland suggests thyroiditis. Thyroid is not markedly enlarged. No significant hyperemia on color flow imaging.2. Left TR3 nodule for which no follow-up is required per TIRADS criteria. WEIGHT:Wt: 10/01/23 124.3 kg 08/10/23 121.1 kg 05/25/23 119.3 kg 04/06/23 118.4 kg 02/15/23 117.5 kg 02/13/23 119.3 kg 01/26/23 118.1 kg 12/27/22 121.6 kg 12/15/22 119.4 kg 12/12/22 119.3 kg HISTORY OF PRESENT ILLNESS (11/2022): - 11/26/2022 - Patient presented to ED with HR up to 140s with palpitations, BP was stable. TSH was < 0.0005, T4 was 2.69. New diagnosis of moderate thyrotoxicosis in a patient with newly Afib/ Sinus tachycardia,  LE swelling.- TSH not completely suppressed, but very low:    Lab Results Component Value Date   TSH 4.020 05/03/2017   TSH, 3RD GENERATION W/ REFLEX TO FT4 0.02 (L) 11/24/2022   FREE T4 2.9 (H) 11/24/2022   T3 TOTAL 376.0 (H) 11/26/2022   THYROID PEROXIDASE AB 86 (H) 11/26/2022 - 11/27/2022 - thyroid US  showed b/l heterogenous echogenicity of the thyroid gland suggesting thyroiditis. Thyroid is not markedly enlarged. There is no significant hyperemia on color flow imaging.- Labs reviewed. Liver panel - WNL. CBC - WBC of 3.6. - Recommendations on discharge:1. Due to Afib and moderate degree of free T4 / Total T3 elevation, start methimazole  20 mg daily.2. Monitor TFTs weekly as still concerned about possibility of thyroiditis as an etiology of thyrotoxicosis. Methimazole  	titration per TFTs.3. Afib Rx per cardiology. Very likely in the setting of thyrotoxicosis. Best Rx - beta blockers, doses are typically are high to get HR controlled. 4. Follow up at [facility] in 4-6 weeks.11/30/2022 - evaluation by cardiologist Dr. Nelida for hospital d/c followup for Afib. Recommendations for Holter monitor, f/u labs, and DCCV pending results. Continue Eliquis  and propranolol . Once euthyroid, plan for monitor to assess for long term Afib control. RTC 4 months.11/2022 - Thyroid US : Isthmus: Measure 0.2 cm. No focal lesions.RIGHT:Right thyroid lobe: 5.2 x 1.5 x 1.7 cm.Echo texture: Heterogenous.LEFT:Left thyroid lobe: 4.8 x 1.7 x 1.4 cm.Echo texture: Heterogenous.Nodules/lesions: A 1.0 x 0.8 x 0.9 cm superior lobe nodule is solid, hyperechoic, not taller than wide, with smooth borders, and no echogenic foci were calcifications. Total points: 3. TI-RADS level: TR 3. IMPRESSION:1. Bilateral heterogenous echogenicity of the thyroid gland suggests thyroiditis. Thyroid is not markedly enlarged. There is no significant hyperemia on color flow imaging.2. Left TR3 nodule for which no follow-up is required per TIRADS criteria.METHIMAZOLE  RX: 11/26/2022 - methimazole  20 mg daily.12/27/2022 - methimazole  10 mg daily.01/22/2023 - methimazole  7.5 mg daily.06/2023 - methimazole  5 mg daily  PAST MEDICAL HISTORY: Past Medical History: Diagnosis Date  ADHD (attention deficit hyperactivity disorder)   Arrhythmia   occasional palpitation - distant past cleared by ekg  Dysthymia   GERD (gastroesophageal reflux disease)   Heart palpitations   Hypertension   Pneumonia   Reflux   Syncope   distant past - 25+ years ago PAST SURGICAL HISTORY: She has a past surgical history that includes Ablation saphenous vein w/ RFA; Urethral sling; and Laparoscopic colon resection.ALLERGIES: Patient has no known allergies.FAMILY HISTORY:  Negative for endocrine diseases. Specifically, no thyroid, parathyroid, adrenal, pituitary or other neuroendocrine diseases or neoplasms.SOCIAL HISTORY: The patient does not smoke or drink excess alcohol and denies any toxic habits or use of illicit substances. Currently she is a Biomedical engineer. There are no pertinent occupational exposures. There is no history of excess radiation exposure to thyroid region or sella.  MEDICATIONS:Current Medications Medication Sig  apixaban  (ELIQUIS ) 5 mg tablet Take 1 tablet (5 mg total) by mouth 2 (two) times daily.  cholecalciferol , vitamin D3, 25 mcg (1,000 unit) tablet Take 1 tablet (1,000 Units total) by mouth daily.  DULoxetine  (CYMBALTA ) 60 mg capsule Take 1 capsule (60 mg total) by mouth daily.  fluocinolone  acetonide oil (DERMOTIC ) 0.01 % ear drops Place into both ears daily as needed.  ketoconazole  (NIZORAL ) 2 % shampoo Apply topically twice a week.  methIMAzole  (TAPAZOLE ) 5 mg tablet Take 1 tablet (5 mg total) by mouth daily.  metoprolol  succinate XL (TOPROL -XL) 50 mg 24 hr tablet Take 1 tablet (50 mg total) by mouth daily. Take  with or immediately following a meal.  metoprolol  tartrate (LOPRESSOR ) 25 mg Immediate Release tablet 1-2 tabs every 12 hours as needed for breakthru heart rate over 100  omeprazole  (PRILOSEC) 40 mg capsule Take 1 capsule (40 mg total) by mouth daily.  [DISCONTINUED] methIMAzole  (TAPAZOLE ) 5 mg tablet Take 1 tablet (5 mg total) by mouth daily. PHYSICAL EXAMINATION: BP 132/80  - Pulse 77  - Temp 97.3 ?F (36.3 ?C) (Temporal)  - Resp 18  - Wt 274 lb  - LMP 02/02/2013 (Exact Date)  - SpO2 96%  - BMI 44.22 kg/m? Constitutional:  Oriented to person, place, and time and well-developed, well-nourished, and in no distress. Skin: Skin is warm with no visible rashes. No stigmata of endocrine syndromes. Eyes:  No stare.  No lid lag.  Extraocular movements intact.  Conjunctiva do not appear injected and there is no scleral icterus.Peri-orbital swelling present but no other symptoms of Graves disease.    Cardiovascular: Normal appearing color. Respiratory:  No observed difficulty breathing or tachypnea.Musculoskeletal:  No observed muscular atrophy.  Skin:  Normal appearing visualized skin, no obvious bruising.Neurologic:  Oriented x 3.  Motor appears grossly intact.Neck:  Neck full. Thyroid gland is not well palpable on the exam. No palpated cervical or supraclavicular lymphadenopathy.  LABORATORY DATA:  Laboratory data reviewed and discussed with the patient.  Pertinent dictated in the History of Present Illness.ASSESSMENT AND PLAN:  1.	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  Thyroid not enlarged. No Graves ophthalmopathy.  2024- TPO Abs / TRAB / TSI elevated. Most likely dealing with Graves disease. 11/2022 - Thyroid US : Bilateral quite heterogenous echogenicity. No significant hyperemia on color flow imaging although.2. 	Thyrotoxicosis/Graves Disease Rx: methimazole  20 mg daily started 11/2022.08/2023 - TSH 1.49, Free T4 1.0. TFT's in euthyroid range.  Continue methimazole  5 mg daily. Refilled provided for a year. Monitor TFT's along with TSI (order placed on 09/09/2023) in 2-3 months and then in 6 months to assure TSH at goal. Medical treatment with methimazole  will be continued for at least 18-24 months. No indications for RAIT. No indications for surgical treatment. Excellent response to methimazole .  3. 	Left thyroid nodule, 1.0 cm TR3. 11/2022- A 1.0 x 0.8 x 0.9 cm superior lobe nodule is solid, hyperechoic, TR 3. Located intrathyroidally. Thyroid parenchyma very heterogeneous, thus, it is possible, we are dealing with pseudo nodule?Repeat thyroid US  at [address] once in 1 years, fall 2025, expect stability. Avoid TSH > 4. 4.        Peri-orbital swelling. No other symptoms of Graves eyes disease. Unclear if due to TED. Patient asked for the referral to ophthalmologist specialized in TED. Will place referaral to Dr. Clementine.  5.        Bone health. 2025 - BMP normal Ca 9.6, 25 Vit D 33. Recommended to continue with  vitamin D supplementation 1000- 2000 Units daily.Should have Bone density screening done through PCP or OB/GYN providers. 6. 	Obesity. Weight management. BMI 42. Unable to address weight management in the setting of Thyroid Clinic at [facility]. Management of weight loss through PCP / weight loss clinic referral may be beneficial. Educated about GLP-1 medications. No contra-indications. Educated about Contrave / Wellbutrin  Rx. No contra-indications.Encouraged to implement physical activity, walking daily great, and / or joining any group classes / and / or personal trainer.   Encouraged to discuss pharmacotherapy for obesity during upcoming visit with PCP in 2 weeks. Encouraged to request referral to work with nutritionist. As per usual, I have reiterated  he importance of healthy life style intervention.7. 	Follow-up in a year. If there are positive findings in the ROS, the patient has been referred back to PCP.  The patient verbalized understanding of our recommendations regarding further evaluation and treatment.  The importance of taking all medications as prescribed was emphasized as was the need to attend all recommended future imaging studies, laboratory testing, referrals and followup visits. Total time spent by the provider on the day of service, which includes time spent on pre-charting and chart review, in-person interaction with the patient, education, coordination of care/services and counseling on the diagnosis, recommendations and plan of care, also, dictation/ editing the consultative note, medications refill, laboratory / diagnostic tests orders was 40  min. Rylend Pietrzak, M.D.Assistant Professor of Clinical Medicine, [employer] of MedicineDepartment of Internal Medicine, Section of EndocrinologyScribed for Ricki Ezella GAILS, MD by Donis Pimenta, medical scribe August 11, 2025The documentation recorded by the scribe accurately reflects the services I personally performed and the decisions made by me. I reviewed and confirmed all material entered and/or pre-charted by the scribe.

## 2023-10-01 NOTE — Patient Instructions
 Continue methimazole  5  mg DAILY. Do labs this week to re-adjust the dose. Repeat labs in 3 and 6 months and 12 months, expect at goal Thyroid US  - will repeat once in summer / fall  2025 at [address], we can do it the same day you are coming for the visitFollow up in 1 yearTaking a multivitamin + mineral supplement for your age group every day is helpful addition to the healthy diet.  Taking 1000 -2000 Units of vit D supplement daily is helpful addition to the healthy diet.  Goal vit D probably around 40. - Vit D has plentiful benefits against bone loss prevention in postmenopausal women. - Vit D has immune regulating effects both for prevention and treatment of autoimmune diseases.  [facility] [address] [place] [address], [place], [place] [address] Phone: [phone] Work Fax: [phone] XR 150 mg in the morning I can refer you to ophthalmologist, Dr. Clementine

## 2023-10-04 ENCOUNTER — Encounter: Admit: 2023-10-04 | Payer: PRIVATE HEALTH INSURANCE | Primary: Internal Medicine

## 2023-10-15 ENCOUNTER — Encounter: Admit: 2023-10-15 | Payer: PRIVATE HEALTH INSURANCE | Attending: Internal Medicine | Primary: Internal Medicine

## 2023-10-15 MED ORDER — FAMOTIDINE 40 MG TABLET
40 | ORAL_TABLET | Freq: Every evening | ORAL | 2 refills | 90.00000 days | Status: AC
Start: 2023-10-15 — End: ?

## 2023-10-15 MED ORDER — BUPROPION HCL XL 150 MG 24 HR TABLET, EXTENDED RELEASE
150 | ORAL_TABLET | Freq: Every morning | ORAL | 6 refills | 90.00000 days | Status: AC
Start: 2023-10-15 — End: 2023-11-28

## 2023-11-02 ENCOUNTER — Encounter: Admit: 2023-11-02 | Payer: PRIVATE HEALTH INSURANCE | Attending: Obstetrics and Gynecology | Primary: Internal Medicine

## 2023-11-13 ENCOUNTER — Ambulatory Visit: Admit: 2023-11-13 | Payer: PRIVATE HEALTH INSURANCE | Primary: Internal Medicine

## 2023-11-13 DIAGNOSIS — H02403 Unspecified ptosis of bilateral eyelids: Secondary | ICD-10-CM

## 2023-11-13 DIAGNOSIS — H02831 Dermatochalasis of right upper eyelid: Principal | ICD-10-CM

## 2023-11-13 NOTE — Progress Notes
 Kristie Frye is a 63 y.o. female who is  sent by Ricki Ezella GAILS, MD for evaluation of Graves Dx and Periorbital swelling. Pt dx with Graves 11/26/22  currently on medications. PT feels eyes are larger but unsureHistory of Present IllnessDiana E Frye is a 63 year old female with Graves' disease who presents with concerns about eyelid puffiness.She experiences increased puffiness around her eyes, which has gradually progressed over the years. Her eyes are generally dry, affected by allergies, and she uses artificial tears for irritation. There is no new pain, pressure, or double vision, and no significant changes in vision recently.She has high astigmatism in her left eye, has had poorer vision in that eye compared to right eye. There is no bulging of the eyes or eyelid retraction.Her eyelids get tired towards the end of the day, with a gradual worsening of eyelid droopiness over the past year. 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   1.          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  Thyroid not enlarged. No Graves ophthalmopathy.  2024- TPO Abs / TRAB / TSI elevated. Most likely dealing with Graves disease.  11/2022 - Thyroid US : Bilateral quite heterogenous echogenicity. No significant hyperemia on color flow imaging although. 2.          Thyrotoxicosis/Graves Disease Rx: methimazole  20 mg daily started 11/2022. 08/2023 - TSH 1.49, Free T4 1.0. TFT's in euthyroid range.  Continue methimazole  5 mg daily. Refilled provided for a year.  Monitor TFT's along with TSI (order placed on 09/09/2023) in 2-3 months and then in 6 months to assure TSH at goal.  Medical treatment with methimazole  will be continued for at least 18-24 months.  No indications for RAIT. No indications for surgical treatment. Excellent response to methimazole .  3.          Left thyroid nodule, 1.0 cm TR3.  11/2022- A 1.0 x 0.8 x 0.9 cm superior lobe nodule is solid, hyperechoic, TR 3. Located intrathyroidally. Thyroid parenchyma very heterogeneous, thus, it is possible, we are dealing with pseudo nodule? Repeat thyroid US  at [facility] once in 1 years, fall 2025, expect stability. Avoid TSH > 4.  4.        Peri-orbital swelling. No other symptoms of Graves eyes disease. Unclear if due to TED.  Patient asked for the referral to ophthalmologist specialized in TED. Will place referaral to Dr. Clementine.  Past Medical History: Diagnosis Date  ADHD (attention deficit hyperactivity disorder)   Arrhythmia   occasional palpitation - distant past cleared by ekg  Dysthymia   GERD (gastroesophageal reflux disease)   Heart palpitations   Hypertension   Pneumonia   Reflux   Sleep apnea 10/15/2023  Syncope   distant past - 25+ years ago  Physical ExamExternal exam:Bilateral upper eyelid ptosis and dermatochalasis with brow compensation No periocular edema/erythemaPupils are round and reactive to light, no afferent pupillary defect Extraocular motility within normal limits, no restrictions, no diplopia. Orthotropic in primary position Slit lamp examination both eyes: Conjunctiva white and quiteCornea clear Anterior chamber deep and quiteMild NSFundus undilated: Disc and macula within normal limits both eyes  Hertel exophthalmometry: 17mm left, 19mm right.  Results  Latest Reference Range & Units 04/20/23 11:25 09/05/23 13:56 Thyroid Stimulating Immunoglobulin <140 % baseline 349 (H) TNP (H): Data is abnormally high Latest Reference Range & Units 11/26/22 15:16 Thyroid-Stimulating Immunoglob, S <=1.3 TSI index 3.8 (H) (H): Data is abnormally high Latest Reference Range & Units 11/24/22 12:15 11/28/22 07:03 12/20/22 11:01 01/19/23 10:20 04/20/23 11:25 07/13/23 09:08 09/05/23 13:56 Free T4 0.8 - 1.8 ng/dL 2.9 (H) 7.30 (H) 1.0 0.9 1.4 0.7 (L) 1.0 (H): Data is abnormally high(L): Data is abnormally low  Latest Reference Range & Units 12/20/22 11:01 01/19/23 10:20 04/20/23 11:25 07/13/23 09:08 09/05/23 13:56 TSH, 3rd Generation 0.40 - 4.50 mIU/L 0.01 (L) 0.06 (L) <0.01 (L) 6.73 (H) 1.49 (L): Data is abnormally low(H): Data is abnormally highAssessment & PlanBilateral upper eyelid ptosis and dermatochalasisBilateral ptosis and dermatochalasis with brow compensation, likely Visually significant. - Schedule follow-up in six months to reassess.Graves' disease without current ophthalmopathyGraves' disease well-controlled with methimazole . No signs of thyroid eye disease. Non-smoker, reducing risk.- Continue methimazole  per endocrinology recommendations- Monitor for thyroid eye disease symptoms, seek evaluation if symptoms develop. Attending Attestation:I was present with the resident during the history and exam. I discussed the case with the resident and agree with the findings and plan as documented in the resident's note.  I personally examined the patient and formulated the plan. I personally discussed with patient and answered all questions.I provided a concise overview of the ambient note generation solution. Kristie Frye or their legally authorized representative verbally consented to a temporary audio recording of their visit to assist with completing the visit documentation using an AI-powered solution. This note was reviewed for accuracy by Rexanne Arms who performed the clinical service.

## 2023-11-26 ENCOUNTER — Encounter: Admit: 2023-11-26 | Payer: PRIVATE HEALTH INSURANCE | Primary: Internal Medicine

## 2023-11-26 NOTE — Telephone Encounter
 Nurse Triage Peggie FORBES Louder reports she has been experiencing bilateral knee pain x 1 month, states she has had the pain occasionally over the last few years, but it has been worse over the last month or so. Patient endorses pain worsens with ambulation, particularly with walking up and down the stairs,  states the pain is 2/10 at rest, 4/10 with movement but can intermittently be sharp and shooting. Patient denies chest pain, difficulty breathing, fever, calf pain, active swelling or redness, but does say that at times the left knee can be swollen. Patient requesting evaluation to determine cause of pain, and physical therapy referral if possible. Appointment made with PCP, encouraged to call back if pain gets worse or if develops other symptoms, agreeable.  DispositionPatient triaged using Schmitt-Thompson, per protocol patient advised to be seen within 2 weeks, appointment scheduledCare advice provided to the patient. Advised the patient to call back with any new or worsening symptoms. Patient verbalizes understanding.Escalation required: NoReason for Disposition [1] MILD pain (e.g., does not interfere with normal activities) AND [2] present > 7 daysAnswer Assessment - Initial Assessment Questions1. LOCATION and RADIATION: Where is the pain located?     Bilaterally, difficulty getting up from chair when standing, thinks it may be weak muscles from Grave's disease 2. QUALITY: What does the pain feel like?  (e.g., sharp, dull, aching, burning)    Sharp/lightening pain intermittently while sitting/legs elevated, Most of the time, the pain is when she is standing 3. SEVERITY: How bad is the pain? What does it keep you from doing?   (Scale 1-10; or mild, moderate, severe)    3-4/10 when walking, when sitting with feet up, pain is 2/10Sometimes the pain goes away when walking, when moving at times hears crepitus 4. ONSET: When did the pain start? Does it come and go, or is it there all the time?    On and off for a few years, consistent x1 month5. RECURRENT: Have you had this pain before? If Yes, ask: When, and what happened then?    Maybe 10 years ago 6. SETTING: Has there been any recent work, exercise or other activity that involved that part of the body?     N/a7. AGGRAVATING FACTORS: What makes the knee pain worse? (e.g., walking, climbing stairs, running)    Going up and down the stairs is difficult 8. ASSOCIATED SYMPTOMS: Is there any swelling or redness of the knee?    no9. OTHER SYMPTOMS: Do you have any other symptoms? (e.g., chest pain, difficulty breathing, fever, calf pain)   NoProtocols used: Knee Pain-Adult-AH

## 2023-11-28 ENCOUNTER — Encounter: Admit: 2023-11-28 | Payer: PRIVATE HEALTH INSURANCE | Primary: Internal Medicine

## 2023-11-28 ENCOUNTER — Ambulatory Visit: Admit: 2023-11-28 | Payer: BLUE CROSS/BLUE SHIELD | Attending: Internal Medicine | Primary: Internal Medicine

## 2023-11-28 VITALS — BP 120/80 | HR 85 | Wt 270.0 lb

## 2023-11-28 DIAGNOSIS — M25561 Pain in right knee: Principal | ICD-10-CM

## 2023-11-28 DIAGNOSIS — G4733 Obstructive sleep apnea (adult) (pediatric): Secondary | ICD-10-CM

## 2023-11-28 DIAGNOSIS — F331 Major depressive disorder, recurrent, moderate: Secondary | ICD-10-CM

## 2023-11-28 MED ORDER — DEXMETHYLPHENIDATE ER 20 MG CAPSULE,EXTENDED RELEASE BIPHASIC50-50
20 | ORAL_CAPSULE | Freq: Every day | ORAL | 1 refills | 16.00000 days | Status: AC
Start: 2023-11-28 — End: ?

## 2023-11-28 MED ORDER — MOUNJARO 2.5 MG/0.5 ML SUBCUTANEOUS PEN INJECTOR
2.5 | SUBCUTANEOUS | 1 refills | 28.00000 days | Status: AC
Start: 2023-11-28 — End: ?

## 2023-11-28 MED ORDER — BUPROPION HCL XL 150 MG 24 HR TABLET, EXTENDED RELEASE
150 | ORAL_TABLET | Freq: Every morning | ORAL | 5 refills | 30.00000 days | Status: AC
Start: 2023-11-28 — End: ?

## 2023-11-29 ENCOUNTER — Encounter
Admit: 2023-11-29 | Payer: PRIVATE HEALTH INSURANCE | Attending: Vascular and Interventional Radiology | Primary: Internal Medicine

## 2023-11-29 ENCOUNTER — Encounter: Admit: 2023-11-29 | Payer: PRIVATE HEALTH INSURANCE | Primary: Internal Medicine

## 2023-12-04 ENCOUNTER — Encounter: Admit: 2023-12-04 | Payer: PRIVATE HEALTH INSURANCE | Primary: Internal Medicine

## 2023-12-11 ENCOUNTER — Encounter: Admit: 2023-12-11 | Payer: PRIVATE HEALTH INSURANCE | Primary: Internal Medicine

## 2023-12-11 ENCOUNTER — Encounter: Admit: 2023-12-11 | Payer: PRIVATE HEALTH INSURANCE | Attending: Internal Medicine | Primary: Internal Medicine

## 2023-12-11 MED ORDER — FAMOTIDINE 40 MG TABLET
40 | ORAL_TABLET | Freq: Every evening | ORAL | 5 refills | 30.00000 days | Status: AC
Start: 2023-12-11 — End: ?

## 2023-12-18 ENCOUNTER — Encounter: Admit: 2023-12-18 | Payer: PRIVATE HEALTH INSURANCE | Primary: Internal Medicine

## 2023-12-22 ENCOUNTER — Encounter: Admit: 2023-12-22 | Payer: PRIVATE HEALTH INSURANCE | Primary: Internal Medicine

## 2023-12-24 ENCOUNTER — Inpatient Hospital Stay: Admit: 2023-12-24 | Discharge: 2023-12-24 | Payer: BLUE CROSS/BLUE SHIELD | Primary: Internal Medicine

## 2023-12-24 ENCOUNTER — Encounter: Admit: 2023-12-24 | Payer: PRIVATE HEALTH INSURANCE | Primary: Internal Medicine

## 2023-12-24 DIAGNOSIS — M25562 Pain in left knee: Secondary | ICD-10-CM

## 2023-12-24 DIAGNOSIS — M25561 Pain in right knee: Principal | ICD-10-CM

## 2023-12-25 ENCOUNTER — Encounter: Admit: 2023-12-25 | Payer: PRIVATE HEALTH INSURANCE | Attending: Internal Medicine | Primary: Internal Medicine

## 2023-12-31 ENCOUNTER — Telehealth: Admit: 2023-12-31 | Payer: PRIVATE HEALTH INSURANCE | Attending: Internal Medicine | Primary: Internal Medicine

## 2023-12-31 ENCOUNTER — Encounter: Admit: 2023-12-31 | Payer: PRIVATE HEALTH INSURANCE | Attending: Internal Medicine | Primary: Internal Medicine

## 2023-12-31 DIAGNOSIS — M170 Bilateral primary osteoarthritis of knee: Principal | ICD-10-CM

## 2024-01-04 ENCOUNTER — Encounter: Admit: 2024-01-04 | Payer: PRIVATE HEALTH INSURANCE | Attending: Obstetrics and Gynecology | Primary: Internal Medicine

## 2024-01-04 ENCOUNTER — Ambulatory Visit: Admit: 2024-01-04 | Payer: PRIVATE HEALTH INSURANCE | Attending: Obstetrics and Gynecology | Primary: Internal Medicine

## 2024-01-04 DIAGNOSIS — Z1231 Encounter for screening mammogram for malignant neoplasm of breast: Secondary | ICD-10-CM

## 2024-01-04 DIAGNOSIS — I10 Essential (primary) hypertension: Secondary | ICD-10-CM

## 2024-01-04 DIAGNOSIS — R55 Syncope and collapse: Principal | ICD-10-CM

## 2024-01-04 DIAGNOSIS — M1990 Unspecified osteoarthritis, unspecified site: Secondary | ICD-10-CM

## 2024-01-04 DIAGNOSIS — F909 Attention-deficit hyperactivity disorder, unspecified type: Secondary | ICD-10-CM

## 2024-01-04 DIAGNOSIS — R1032 Left lower quadrant pain: Secondary | ICD-10-CM

## 2024-01-04 DIAGNOSIS — F341 Dysthymic disorder: Secondary | ICD-10-CM

## 2024-01-04 DIAGNOSIS — K219 Gastro-esophageal reflux disease without esophagitis: Secondary | ICD-10-CM

## 2024-01-04 DIAGNOSIS — IMO0001 Reflux: Secondary | ICD-10-CM

## 2024-01-04 DIAGNOSIS — J189 Pneumonia, unspecified organism: Secondary | ICD-10-CM

## 2024-01-04 DIAGNOSIS — Z1211 Encounter for screening for malignant neoplasm of colon: Secondary | ICD-10-CM

## 2024-01-04 DIAGNOSIS — I499 Cardiac arrhythmia, unspecified: Secondary | ICD-10-CM

## 2024-01-04 DIAGNOSIS — G4730 Sleep apnea, unspecified: Secondary | ICD-10-CM

## 2024-01-04 DIAGNOSIS — Z01419 Encounter for gynecological examination (general) (routine) without abnormal findings: Principal | ICD-10-CM

## 2024-01-04 DIAGNOSIS — R002 Palpitations: Secondary | ICD-10-CM

## 2024-01-07 ENCOUNTER — Encounter: Admit: 2024-01-07 | Payer: PRIVATE HEALTH INSURANCE | Attending: Internal Medicine | Primary: Internal Medicine

## 2024-01-07 VITALS — BP 150/80 | HR 86

## 2024-01-07 DIAGNOSIS — Z0000 Encounter for general adult medical examination without abnormal findings: Principal | ICD-10-CM

## 2024-01-07 DIAGNOSIS — G4733 Obstructive sleep apnea (adult) (pediatric): Secondary | ICD-10-CM

## 2024-01-07 DIAGNOSIS — I480 Paroxysmal atrial fibrillation: Secondary | ICD-10-CM

## 2024-01-07 DIAGNOSIS — Z1231 Encounter for screening mammogram for malignant neoplasm of breast: Secondary | ICD-10-CM

## 2024-01-07 DIAGNOSIS — Z23 Encounter for immunization: Principal | ICD-10-CM

## 2024-01-07 DIAGNOSIS — I10 Essential (primary) hypertension: Secondary | ICD-10-CM

## 2024-01-15 ENCOUNTER — Encounter: Admit: 2024-01-15 | Payer: PRIVATE HEALTH INSURANCE | Attending: Obstetrics and Gynecology | Primary: Internal Medicine

## 2024-01-15 NOTE — Result Encounter Note [77]
 Normal pap, neg HPV

## 2024-01-16 ENCOUNTER — Encounter: Admit: 2024-01-16 | Payer: PRIVATE HEALTH INSURANCE | Attending: Internal Medicine | Primary: Internal Medicine

## 2024-01-19 ENCOUNTER — Encounter: Admit: 2024-01-19 | Payer: PRIVATE HEALTH INSURANCE | Attending: Internal Medicine | Primary: Internal Medicine

## 2024-01-19 ENCOUNTER — Encounter: Admit: 2024-01-19 | Payer: PRIVATE HEALTH INSURANCE | Attending: "Endocrinology | Primary: Internal Medicine

## 2024-01-19 ENCOUNTER — Encounter: Admit: 2024-01-19 | Payer: PRIVATE HEALTH INSURANCE | Primary: Internal Medicine

## 2024-01-19 DIAGNOSIS — E0500 Thyrotoxicosis with diffuse goiter without thyrotoxic crisis or storm: Principal | ICD-10-CM

## 2024-01-19 LAB — COMPREHENSIVE METABOLIC PANEL
ALBUMIN/GLOBULIN RATIO: 1.4 (calc) (ref 1.0–2.5)
ALBUMIN: 4.1 g/dL (ref 3.6–5.1)
ALKALINE PHOSPHATASE: 154 U/L — ABNORMAL HIGH (ref 37–153)
ALT (SGPT): 35 U/L — ABNORMAL HIGH (ref 6–29)
AST (SGOT): 34 U/L (ref 10–35)
BILIRUBIN, TOTAL: 0.5 mg/dL (ref 0.2–1.2)
BLOOD UREA NITROGEN: 10 mg/dL (ref 7–25)
CALCIUM: 9.2 mg/dL (ref 8.6–10.4)
CHLORIDE: 104 mmol/L (ref 98–110)
CO2: 33 mmol/L — ABNORMAL HIGH (ref 20–32)
CREATININE: 0.76 mg/dL (ref 0.50–1.05)
EGFR, CREATININE (CKD-EPI 2021) QUEST: 89 mL/min/1.73m2 (ref >=60)
GLOBULIN: 3 g/dL (ref 1.9–3.7)
GLUCOSE: 108 mg/dL — ABNORMAL HIGH (ref 65–99)
POTASSIUM: 4.4 mmol/L (ref 3.5–5.3)
PROTEIN, TOTAL, SPEP: 7.1 g/dL (ref 6.1–8.1)
SODIUM: 143 mmol/L (ref 135–146)

## 2024-01-19 LAB — CBC AND DIFFERENTIAL
BASOPHILS ABSOLUTE COUNT: 59 {cells}/uL (ref 0–200)
BASOPHILS: 1.2 %
EOSINOPHILS ABSOLUTE COUNT: 211 {cells}/uL (ref 15–500)
EOSINOPHILS: 4.3 %
HEMATOCRIT BLOOD: 42 % (ref 35.9–46.0)
HEMOGLOBIN: 14.1 g/dL (ref 11.7–15.5)
LYMPHOCYTES ABSOLUTE COUNT: 2009 {cells}/uL (ref 850–3900)
LYMPHOCYTES: 41 %
MCH: 30.9 pg (ref 27.0–33.0)
MCHC-HEMOGLOBINOPATHY: 33.6 g/dL (ref 31.6–35.4)
MCV: 91.9 fL (ref 81.4–101.7)
MONOCYTES ABSOLUTE COUNT: 412 {cells}/uL (ref 200–950)
MONOCYTES: 8.4 %
MPV: 10.4 fL (ref 7.5–12.5)
NEUTROPHILS ABSOLUTE COUNT: 2210 {cells}/uL (ref 1500–7800)
NEUTROPHILS: 45.1 %
PLATELET COUNT: 184 Thousand/uL (ref 140–400)
RDW: 12.7 % (ref 11.0–15.0)
RED BLOOD CELL COUNT: 4.57 Million/uL (ref 3.80–5.10)
WHITE BLOOD CELL COUNT: 4.9 Thousand/uL (ref 3.8–10.8)

## 2024-01-19 LAB — LIPID PANEL
CHOLESTEROL, TOTAL: 177 mg/dL (ref <200)
CHOLESTEROL/HDL RATIO: 3.4 (calc) (ref <5.0)
HDL CHOLESTEROL: 52 mg/dL (ref >=50)
LDL CHOLESTEROL: 103 mg/dL — ABNORMAL HIGH
NON-HDL CHOLESTEROL: 125 mg/dL (ref <130)
TRIGLYCERIDES: 121 mg/dL (ref <150)

## 2024-01-19 LAB — T3: T3 TOTAL: 129 ng/dL (ref 76–181)

## 2024-01-19 LAB — VITAMIN D, 25-HYDROXY: VITAMIN D, 25 OH, TOTAL: 35 ng/mL (ref 30–100)

## 2024-01-19 LAB — T4, FREE: FREE T4: 1.1 ng/dL (ref 0.8–1.8)

## 2024-01-19 LAB — TSH: TSH, 3RD GENERATION: 1.26 m[IU]/L (ref 0.40–4.50)

## 2024-01-19 NOTE — Progress Notes [1]
 Labs reviewed:Lab Results Component Value Date  TSH <0.005 (L) 11/28/2022  TSH, 3RD GENERATION 1.26 01/18/2024  TSH, 3RD GENERATION W/ REFLEX TO FT4 0.02 (L) 11/24/2022  FREE T4 1.1 01/18/2024  T3 TOTAL 129 01/18/2024  T3 FREE 7.2 11/28/2022  THYROGLOBULIN (ROCHE) 112.0 (H) 11/27/2022  THYROGLOBULIN AB (ROCHE) <15.0 11/27/2022  THYROID PEROXIDASE AB 28 (H) 04/20/2023  Current RX:  Methimazole  5 mg daily Recommendations:- continue methimazole  5 mg daily- monitor labs every 6 months. all orders placed

## 2024-01-22 LAB — THYROID STIMULATING IMMUNOGLOBULIN: THYROID STIMULATING IMMUNOGLOBULIN: 311 %{baseline} — ABNORMAL HIGH (ref <140)

## 2024-01-25 ENCOUNTER — Ambulatory Visit: Admit: 2024-01-25 | Payer: PRIVATE HEALTH INSURANCE | Primary: Internal Medicine

## 2024-01-25 ENCOUNTER — Encounter: Admit: 2024-01-25 | Payer: PRIVATE HEALTH INSURANCE | Attending: Obstetrics and Gynecology | Primary: Internal Medicine

## 2024-01-25 ENCOUNTER — Encounter: Admit: 2024-01-25 | Payer: PRIVATE HEALTH INSURANCE | Primary: Internal Medicine

## 2024-01-25 VITALS — Ht 68.0 in | Wt 270.0 lb

## 2024-01-25 DIAGNOSIS — G4730 Sleep apnea, unspecified: Secondary | ICD-10-CM

## 2024-01-25 DIAGNOSIS — M1990 Unspecified osteoarthritis, unspecified site: Secondary | ICD-10-CM

## 2024-01-25 DIAGNOSIS — K219 Gastro-esophageal reflux disease without esophagitis: Secondary | ICD-10-CM

## 2024-01-25 DIAGNOSIS — J189 Pneumonia, unspecified organism: Secondary | ICD-10-CM

## 2024-01-25 DIAGNOSIS — F909 Attention-deficit hyperactivity disorder, unspecified type: Secondary | ICD-10-CM

## 2024-01-25 DIAGNOSIS — F341 Dysthymic disorder: Secondary | ICD-10-CM

## 2024-01-25 DIAGNOSIS — IMO0001 Reflux: Secondary | ICD-10-CM

## 2024-01-25 DIAGNOSIS — R55 Syncope and collapse: Principal | ICD-10-CM

## 2024-01-25 DIAGNOSIS — I499 Cardiac arrhythmia, unspecified: Secondary | ICD-10-CM

## 2024-01-25 DIAGNOSIS — I10 Essential (primary) hypertension: Secondary | ICD-10-CM

## 2024-01-25 DIAGNOSIS — M170 Bilateral primary osteoarthritis of knee: Principal | ICD-10-CM

## 2024-01-25 DIAGNOSIS — R002 Palpitations: Secondary | ICD-10-CM

## 2024-01-25 MED ORDER — LIDOCAINE 5 % ADHESIVE PATCH
5 | MEDICATED_PATCH | TRANSDERMAL | 3 refills | 30.00000 days | Status: AC
Start: 2024-01-25 — End: ?

## 2024-01-25 NOTE — Progress Notes [1]
 Review of Systems Musculoskeletal:       Patient presents to clinic for Bilateral knee arthritisLeft knee pain today at rest 0/10. Worst pain 5/10 sharp sometimes dull pain with walking, and while sleeping.  Can sometimes wake her. Denies numbness, tingling and any radiating pain. Denies a hx of sx, injections and PT. Does not wear a brace for  support, and does not use any assistive devices for ambulation. Patient reports popping and cracking sound.Right knee pain today 0/10.  Worst pain 7/10 aching pain random activities, Denies numbness and tingling but reports radiating pain from knee to mid thigh and knee to mid shin. Patient also reports times of weakness. No SX,injections, but has a hx of B/L knee PT > 10 years ago.  Patient reports using orthotics, except when at the house. All other systems reviewed and are negative.

## 2024-01-25 NOTE — Progress Notes [1]
 ORTHOPAEDICS & REHABILITATION Division of Adult ReconstructionHip & Knee ArthritisTotal Joint Replacement & Revision [email or web address] Name:	Kristie Frye Number:	[id number] Date of Birth:	Jan 15, 1962Date of Visit:	01/25/2024  Chief Complaint:  Pain and Osteoarthritis of the Right Knee (Bilateral knee arthritis) and Pain and Osteoarthritis of the Left KneePatient's Primary Physician:Dr. Larayne, JodiReferred for Evaluation ab:Empfjmb care provider Patient Evaluated with:UnaccompaniedHPI:Kristie Frye is a 63 y.o. year old female who comes in today for evaluation of her bilateral knee pain, right greater than left.  She states that she has experienced some discomfort in her knees for approximately 10 years, however, has been worsening over the past few weeks.  Pain is localized to the anterior aspect of bilateral knees.  She also reports experiencing short, sporadic episodes of bilateral quadriceps pain and/or weakness.  She states that she was recently diagnosed with a Graves disease in his unsure if this is due to her diagnosis or the osteoarthritis in her knees.  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  She reports difficulty descending steps and states that her knees feel ?stiff. ?  She denies any instability, trauma, falls, or injuries.Activities:The pain is worsened by walking, walking up and down stairs, and standing up from a seated position.She reports pain with prolonged ambulation.She is typically active.Surgical history:Denies any history of injuries or surgeries to her bilateral knee. Treatments tried:- NSAIDS: - unable to tolerate due to history of AFib and currently on Eliquis - Tylenol: +- Topicals: + Voltaren gel- Narcotic pain medication: -- CSI: -- Viscosupplementation: -- Physical therapy: + currently prescribed by her primary care provider and is scheduled to start next week.- Bracing: -- Cryotherapy: +- Heat: +- Assistive device: -- Other medications/treatments: -Medical history:She denies a history of VTE. She denies a history of diabetes. Social history:She is recently retired from being a contractor.She denies tobacco or drug.  She reports rarely using alcohol.The patient presents for initial evaluation and management in my office today.Recent Prior History:No Patient Care Coordination Note on file.Review of Systems:The patient's intake sheet was reviewed at the time of the evaluation today.In addition, I have reviewed the ROS recorded by the medical assistant.   Risk Assessment:Ortho Surgery Risk  Current as of about an hour ago    10 0 to 9 Points: Low Risk 10 to 100 Points: High Risk   Last Change:         Points Metrics 5 BMI:  41.05   Patients with a BMI greater than 35 receive 3 points. Patients with a BMI greater than or equal to 40 receive 5 points.  Current as of about an hour ago 1 Hx of AFIB:  Yes   Patients with AFIB in history or problem list receive 1 point.  Current as of about an hour ago 1 Anticoagulants on Med List:  Yes   Patients on Anticoagulants receive 1 point.  Current as of about an hour ago 3 Hx of Sleep Apnea or STOP BANG Score >= 4:  2   Patients with sleep apnea in their history or problem list will receive 3 points or patients with a Stop Bang score greater than 4 and CO2 greater than 26 receive 3 points.  Current as of about an hour ago   Past Medical History:Past Medical History[1] Past Surgical History:Past Surgical History[2] Medications:Current Outpatient Medications Medication Sig  apixaban  Take 1 tablet (5 mg total) by mouth 2 (two) times daily.  cholecalciferol  (vitamin D3) Take  1 tablet (1,000 Units total) by mouth daily.  DULoxetine  Take 1 capsule (60 mg total) by mouth daily.  famotidine  TAKE 1 TABLET(40 MG) BY MOUTH EVERY NIGHT  fluocinolone  acetonide oil Place into both ears daily as needed.  ketoconazole  Apply topically twice a week.  lidocaine  Place 1 patch over 12 hours onto the skin every 24 hours. Remove & Discard patch within 12 hours or as directed by MD  methIMAzole  Take 1 tablet (5 mg total) by mouth daily.  metoprolol  succinate XL Take 1 tablet (50 mg total) by mouth daily. Take with or immediately following a meal.  multivitamin with minerals Take 1 tablet by mouth daily.  omeprazole  Take 1 capsule (40 mg total) by mouth daily. No current facility-administered medications for this visit. Allergies:Allergies[3]Social History:Social History Socioeconomic History  Marital status: Widowed Tobacco Use  Smoking status: Never  Smokeless tobacco: Never Vaping Use  Vaping status: Never Used Substance and Sexual Activity  Alcohol use: Not Currently  Drug use: No  Sexual activity: Not Currently   Partners: Female Social Drivers of Health Financial Resource Strain: Low Risk (05/24/2020)  Overall Financial Resource Strain (CARDIA)   Difficulty of Paying Living Expenses: Not hard at all Food Insecurity: No Food Insecurity (02/13/2023)  Hunger Vital Sign   Worried About Running Out of Food in the Last Year: Never true   Ran Out of Food in the Last Year: Never true Transportation Needs: No Transportation Needs (02/13/2023)  PRAPARE - Designer, Jewellery (Medical): No   Lack of Transportation (Non-Medical): No Housing Stability: Low Risk (02/13/2023)  Housing Stability   Housing Stability: I have a steady place to live  Occupation: Occupational History  Not on file   Physical Exam:Height 5' 8 (1.727 m), weight 122.5 kg, last menstrual period 02/02/2013, not currently breastfeeding.Body mass index is 41.05 kg/m?. -  General:  Unaccompanied for visit  - Constitutional:  Well-developed, well-nourished, healthy-appearing, and in no acute distress.  - Neurologic: A&O x 3; NAD. Gait noted below.     - Psychologic:  Demonstrates appropriate interactions, mood, and affect.    - Head: NCAT, symmetric face.    - Skin: overlying the LE is without evidence of rash, erythema, or trophic changes.   - Lymph:  No generalized lower extremity lymphedema.   - CV:  lower extremities are warm and well perfused.  - Musculoskeletal / Extremity:Gait:  StableSensation: Grossly normal to LT.Motor: 5/5 Strength in all groups tested.KNEE EXAM RIGHT LEFT Incision Skin intact without erythema Skin intact without erythema Effusion None None Alignment Neutral Genu varum Range of Motion (Degrees) 0-120 0-120 Gross AP Stability Normal Normal Varus / Valgus Stability at 0 Normal Normal Varus / Valgus Stability at 30 Normal Normal McMurray's Test Normal Normal Lachman's Test Normal Normal Joint Line Tenderness Moderate medial and lateral Moderate medial Patello-Femoral Crepitus Moderate Moderate Patello-Femoral Tendeness Moderate Moderate Patella Tracking Normal Normal The remainder of the lower extremity is otherwise unremarkable.Imaging:  XRs from 12/24/2023 were reviewed by me which demonstrate moderate to severe tricompartmental osteoarthritis, worse to the medial compartments. Formal Radiology Reading follows below:XR Knee Bilateral AP and Lateral ([facility])Result Date: 11/3/2025XR KNEE BILATERAL AP AND LATERAL ([facility]) Clinical Indication: bilateral knee pain. Comparison: None. Findings: No acute fracture or dislocation. Tricompartment joint space narrowing with subchondral sclerosis and osteophyte formation, most pronounced in the medial compartments. No joint effusion. Soft tissues are unremarkable. Impression: Moderate to severe bilateral knee osteoarthrosis. [facility] Rad[facility] Notify  System Classification: Routine. Reported and signed by: Marciana Abide, MD  [facility] Radiology and Biomedical Imaging External Photography - OU (CPT [id number])Result Date: 9/23/2025Bilateral upper eyelid ptosis and dermatochalasis with brow compensation No periocular edema/erythema RECENT LAB TESTING:  Lab Results Component Value Date  WBC 4.9 01/18/2024  HGB 14.1 01/18/2024  HCT 42.0 01/18/2024  MCV 91.9 01/18/2024  PLT 184 01/18/2024 Lab Results Component Value Date  CREATININE 0.76 01/18/2024  BUN 10 01/18/2024  NA 143 01/18/2024  K 4.4 01/18/2024  CL 104 01/18/2024  CO2 33 (H) 01/18/2024 Lab Results Component Value Date  INR 0.88 05/10/2010 CBC:Lab Results Component Value Date  WBC 4.9 01/18/2024  WBC 5.1 11/27/2022  RBC 4.57 01/18/2024  RBC 4.40 11/27/2022  RBC 4.4 05/03/2017  HGB 14.1 01/18/2024  HCT 42.0 01/18/2024  MCV 91.9 01/18/2024  MCV 87.5 11/27/2022  MCH 30.9 01/18/2024  MCH 28.9 11/27/2022  MCHC 33.6 01/18/2024  MCHC 33.0 11/27/2022  RDW 12.7 01/18/2024  PLT 184 01/18/2024  PLT 191 11/27/2022  MPV 10.4 01/18/2024  MPV 10.8 11/27/2022 CMP:Lab Results Component Value Date  GLU 108 (H) 01/18/2024  GLU 134 (H) 11/27/2022  BUN 10 01/18/2024  BUN 15 11/27/2022  CREATININE 0.76 01/18/2024  CREATININE 0.63 11/27/2022  NA 143 01/18/2024  NA 141 11/27/2022  K 4.4 01/18/2024  K 4.2 11/27/2022  CL 104 01/18/2024  CL 106 11/27/2022  CO2 33 (H) 01/18/2024  CO2 24 11/27/2022  ALBUMIN 4.1 01/18/2024  ALBUMIN 3.6 11/26/2022  PROT 6.3 11/26/2022  BILITOT 0.5 01/18/2024  BILITOT 0.5 11/26/2022  ALKPHOS 154 (H) 01/18/2024  ALKPHOS 111 11/26/2022  ALT 35 (H) 01/18/2024  ALT 24 11/26/2022  ALT 40 (H) 08/26/2013  GLOB 3.0 01/18/2024  GLOB 2.7 11/26/2022  CALCIUM 9.2 01/18/2024  CALCIUM 9.1 11/27/2022  EGFR 68 01/03/2012 ESR & CRP (Last 3 Values):  Component Value Date/Time  SEDRATE 8 11/26/2022 1516 HEMOGLOBIN A1C:Lab Results Component Value Date  HGBA1C 5.6 04/13/2023  HGBA1C 6.4 09/15/2022  HGBA1C 6.1 (H) 01/20/2022  HGBA1C 6.1 01/21/2021  HGBA1C 6.2 11/04/2020  HGBA1C 5.5 02/13/2019  HGBA1C 5.7 08/26/2013 RECENT MICRO LAB TESTING:No results found for: MICRODiagnosis Coding:  ICD-10-CM  1. Primary osteoarthritis of knees, bilateral  M17.0 lidocaine  (LIDODERM ) 5 %  Assessment / Narrative:The history, examination, and radiographic findings were reviewed in detail with the patient today.  We discussed nonsurgical interventions for the management of her bilateral knee pain which included physical therapy referral, topical treatments, and CSI versus viscosupplementation injection.  At this time, patient would like to decline any injections until she is evaluated by Physical therapy.  We discussed that once she is evaluated by physical therapy and has attended her sessions, we may re-evaluate the need for any possible injections.  A prescription for lidocaine  patches 5% was placed at today's office visit.  Patient was reminded to not take any oral anti-inflammatories as she is currently on Eliquis  for history of atrial fibrillation.  Patient verbalized understanding.Further Workup / Advanced Imaging Plan: Deferred Treatment / Intervention Plan:Patient advised to continue to utilize conservative treatment modalities:- Modify activities below threshold of discomfort & avoid particular triggers of pain when possible. -Choose non impact exercises like stationary bike, water exercising, and chair based routines.-During flare-ups utilize resting, elevation, cryotherapy/heating pad, and compression if helpful.-Utilize cane or walker to unload painful joints and for better stability/safety when needed.-Proper body weight, leg strength maintenance, and supportive shoes for standing/ambulating.-OTC meds like Tylenol for discomfort relief and NSAIDS for inflammation (if able).-Topical preparations such as Diclofenac gel and  Lidocaine  patches and creams as helpful.Follow-Up Plan:Follow up in 3 months for clinical reassessment and ongoing guidance. Thank you for the kind courtesy of this patient's Timmothy Gull, MSN, APRN, FNP-[facility] [id number] [facility], [phone]  	[phone] 		Main Office & Appointments	[phone]		Fax Line	[phone])	Assistant Direct LineModal DictationBilling & Compliance: On the day of this patient's encounter, a total of 30 minutes was personally spent by me.  This time included preparation with record and imaging review, obtaining and reviewing patient history, performing a medically appropriate examination, counseling the patient, family, and/or caregiver, ordering medications, tests, or procedures, referring to and communicating with other healthcare professionals, and documenting all of the above clinical information into the medical record. This does not include any resident/fellow teaching time, or any time spent performing a procedural service. cc:  Patient Care Team:Indes, Myla, MD as PCP - General (Internal Medicine)Shore, Genevia Maxwell, MD as Obstetrician (Obstetrics and Gynecology) [1] Past Medical History:Diagnosis Date  ADHD (attention deficit hyperactivity disorder)   Arrhythmia   occasional palpitation - distant past cleared by ekg  Dysthymia   GERD (gastroesophageal reflux disease)   Heart palpitations   Hypertension   Osteoarthritis   bilateral knees  Pneumonia   Reflux   Sleep apnea 10/15/2023  Syncope   distant past - 25+ years ago [2] Past Surgical History:Procedure Laterality Date  ABLATION SAPHENOUS VEIN W/ RFA    LAPAROSCOPIC COLON RESECTION    partial removal of small intestine  URETHRAL SLING   [3] No Known Allergies

## 2024-01-28 ENCOUNTER — Ambulatory Visit: Admit: 2024-01-28 | Payer: PRIVATE HEALTH INSURANCE | Primary: Internal Medicine

## 2024-01-28 ENCOUNTER — Encounter: Admit: 2024-01-28 | Payer: PRIVATE HEALTH INSURANCE | Primary: Internal Medicine

## 2024-01-28 VITALS — Ht 68.0 in | Wt 270.0 lb

## 2024-01-28 DIAGNOSIS — I499 Cardiac arrhythmia, unspecified: Secondary | ICD-10-CM

## 2024-01-28 DIAGNOSIS — IMO0001 Reflux: Secondary | ICD-10-CM

## 2024-01-28 DIAGNOSIS — I10 Essential (primary) hypertension: Secondary | ICD-10-CM

## 2024-01-28 DIAGNOSIS — F341 Dysthymic disorder: Secondary | ICD-10-CM

## 2024-01-28 DIAGNOSIS — G4730 Sleep apnea, unspecified: Secondary | ICD-10-CM

## 2024-01-28 DIAGNOSIS — J189 Pneumonia, unspecified organism: Secondary | ICD-10-CM

## 2024-01-28 DIAGNOSIS — R55 Syncope and collapse: Principal | ICD-10-CM

## 2024-01-28 DIAGNOSIS — Z6841 Body Mass Index (BMI) 40.0 and over, adult: Secondary | ICD-10-CM

## 2024-01-28 DIAGNOSIS — M1990 Unspecified osteoarthritis, unspecified site: Secondary | ICD-10-CM

## 2024-01-28 DIAGNOSIS — K219 Gastro-esophageal reflux disease without esophagitis: Secondary | ICD-10-CM

## 2024-01-28 DIAGNOSIS — I4891 Unspecified atrial fibrillation: Secondary | ICD-10-CM

## 2024-01-28 DIAGNOSIS — E0590 Thyrotoxicosis, unspecified without thyrotoxic crisis or storm: Secondary | ICD-10-CM

## 2024-01-28 DIAGNOSIS — R002 Palpitations: Secondary | ICD-10-CM

## 2024-01-28 DIAGNOSIS — F909 Attention-deficit hyperactivity disorder, unspecified type: Secondary | ICD-10-CM

## 2024-01-28 NOTE — Progress Notes [1]
 [facility] [address], [place], [place] [address]Eynwz: [phone] Fax: [phone] Maricela, MD - Arlyne Kato, MD, MHS - Delon Fabry, MD - Eleanor Shine, MD, PhD - Redell Keto, MDBrienne Miner, MD - Devere Barb Cera, PhD, DBSM - Redell Carrel, MD - Antonina Stank, MDLynelle Matewan, PsyD - Holmes Led, MD - Tinnie Dasen, MD - Trinna Haller, MD [name], MD, MS, Medical Director HILARIO Kendall Flack, MD, MPH, Director VIDEO TELEHEALTH VISIT: This clinician is part of the telehealth program and is conducting this visit in a currently approved location. For this visit the clinician and patient were present via interactive audio & video telecommunications system that permits real-time communications, via the [employer].Patient's use of the telehealth platform followed consent and acknowledges agreement to permit telehealth for this visit.State patient is located in: [place]The clinician is appropriately licensed in the above state to provide care for this visit.Other individuals present during the telehealth encounter and their role/relation: noneBecause this visit was completed over video, a hands-on physical exam was not performed. Patient/parent or guardian understands and knows to call back if condition changes.  Patient Name:  Kristie Frye of Birth:  Dec 11, 1962Date of Service: 12/8/2025EPIC MRN:  [id number] Provider:  Venetia JONELLE Frye, PAHistory Of Present Illness: Ms. Kristie Frye  is a 63 y.o.-year-old female who has a h/o OSA and has been started on PAP therapy and is here for their 31-90 d f/u per insurance requirements. Doing well on current therapy, awakes refreshed without daytime drowsiness.  DME supplier: [facility] day report:  Past Medical History : She  has a past medical history of ADHD (attention deficit hyperactivity disorder), Arrhythmia, Dysthymia, GERD (gastroesophageal reflux disease), Heart palpitations, Hypertension, Osteoarthritis, Pneumonia, Reflux, Sleep apnea (10/15/2023), and Syncope.  Social History:   She  reports that she has never smoked. She has never used smokeless tobacco. She reports that she does not currently use alcohol. She reports that she does not use drugs. Family History:   She family history includes Breast cancer in her paternal aunt; Coronary Artery Disease in her mother; Diabetes in her brother, brother, father, and mother; Heart disease in her mother; Hypertension in her brother and brother; Obesity in her brother and brother; Pulmonary embolism in her mother; Sleep apnea in her brother and brother; Thrombophilia in her mother. Allergies: She has no known allergies. Medications: Encounter Medications[1] Examination : Vital signs: Last menstrual period 02/02/2013, not currently breastfeeding. There is no height or weight on file to calculate BMI.    No data to display     General: well appearing, nad, no conversational or ambulatory dyspnea Epworth Sleepiness   05/24/2023  11:08 AM 05/11/2023   2:37 AM Epworth Sleepiness Scale 1. Sitting and reading 0-Would never doze  0-Would never doze  2. Watching TV 1-Slight chance of dozing  1-Slight chance of dozing  3. Sitting, inactive in a public place (e.g. a theatre or a meeting) 0-Would never doze  0-Would never doze  4. As a passenger in a car for an hour without a break 0-Would never doze  0-Would never doze  5. Lying down to rest in the afternoon when circumstances permit 3-High chance of dozing  3-High chance of dozing  6. Sitting and talking to someone 0-Would never doze  0-Would never doze  7. Sitting quietly after a lunch without alcohol 1-Slight chance of dozing  1-Slight chance of dozing  8. In a car, while stopped for a few minutes in traffic  0-Would never doze  0-Would never doze  Epworth Total Score 5  5    Patient-reported  COMPLIANCE DATA IN MEDIA TAB HAS BEEN REVIEWEDSLEEP STUDY RESULTS HAVE BEEN REVIEWEDImpression/Plan:This is a 63 y.o.-year-old female with a h/o OSA initiated on PAP therapy here for 31-90 d adherence follow up. During this visit, we reviewed operation, safety, disinfecting/cleaning, and maintenance of PAP unit and supplies. Opportunity given to pt to ask questions regarding operation, safety, cleaning, and maintenance of PAP unit and supplies. Patient verbally acknowledged their understanding & responsibilities.  DME contact information provided for any issues related to therapy including mask fitting, operation, machine safety, and supplies. Discussed compliance guidelines.  All questions were answered.- PAP supplies order renewed- One year follow up[x]   The patient is compliant with PAP per CMS criteria, and benefiting with improved sleep quality and/or daytime function. Continue PAP therapy and f/u with provider in 9-12 months.[]  The patient is NOT compliant with PAP per CMS criteria. The following issues and interventions were discussed, and the patient was referred to the [place] CPAP support group for further management and referred to their DME for further support and assistance. The patient requires an extended trial of PAP therapy based on their severity of sleep apnea, poor sleep, daytime dysfunction, and/or medical co-morbidities. They will be reassessed in another 2-4 months for compliance and benefit. []  PSG needed to Re-qualify[]  Poor mask fit/mask leak[]  Dry Mouth[]  Pressure intolerance []  Claustrophobia or other psychological/behavioral barriers[]  Extenuating circumstances[]  OtherSigned:Thank Rosine Kristie Artist PA-C, [facility]   [1] Outpatient Encounter Medications as of 01/28/2024 Medication Sig Dispense Refill  apixaban  (ELIQUIS ) 5 mg tablet Take 1 tablet (5 mg total) by mouth 2 (two) times daily. 180 tablet 3  cholecalciferol , vitamin D3, 25 mcg (1,000 unit) tablet Take 1 tablet (1,000 Units total) by mouth daily.    DULoxetine  (CYMBALTA ) 60 mg capsule Take 1 capsule (60 mg total) by mouth daily. 90 capsule 4  famotidine  (PEPCID ) 40 mg tablet TAKE 1 TABLET(40 MG) BY MOUTH EVERY NIGHT 90 tablet 4  fluocinolone  acetonide oil (DERMOTIC ) 0.01 % ear drops Place into both ears daily as needed.    ketoconazole  (NIZORAL ) 2 % shampoo Apply topically twice a week.    methIMAzole  (TAPAZOLE ) 5 mg tablet Take 1 tablet (5 mg total) by mouth daily. 90 tablet 3  metoprolol  succinate XL (TOPROL -XL) 50 mg 24 hr tablet Take 1 tablet (50 mg total) by mouth daily. Take with or immediately following a meal. 90 tablet 3  multivitamin with minerals tablet Take 1 tablet by mouth daily.    omeprazole  (PRILOSEC) 40 mg capsule Take 1 capsule (40 mg total) by mouth daily. 30 capsule 11 No facility-administered encounter medications on file as of 01/28/2024.

## 2024-02-04 ENCOUNTER — Ambulatory Visit: Admit: 2024-02-04 | Payer: PRIVATE HEALTH INSURANCE | Attending: Obstetrics and Gynecology | Primary: Internal Medicine

## 2024-02-04 ENCOUNTER — Encounter: Admit: 2024-02-04 | Payer: PRIVATE HEALTH INSURANCE | Primary: Internal Medicine

## 2024-02-04 VITALS — BP 130/78 | Ht 68.0 in | Wt 265.0 lb

## 2024-02-04 DIAGNOSIS — R1031 Right lower quadrant pain: Principal | ICD-10-CM

## 2024-02-04 DIAGNOSIS — R1032 Left lower quadrant pain: Secondary | ICD-10-CM

## 2024-02-04 NOTE — Progress Notes [1]
 I have personally interpreted the images.  Impression: TV US  DONE FOR RLQ PAIN ANTEVERTED UTERUSENDOMETRIUM= 5.8 MMBILATERAL OV NOT SEENBILATERAL ADNEXA WNLNO FF IN CDS THE SENSITIVE PORTION OF THIS EXAM WAS PERFORMED WITH A MEDICAL CHAPERONE PRESENT: NORLENE SERVIDIO, MA Electronically Signed by Luke MOTE Genette, MD, February 04, 2024

## 2024-02-04 NOTE — Progress Notes [1]
 Kristie Frye is a 63 y.o. G22P2002 female who presents to this practice for an annual exam.  Denies bleeding, discharge, painPatient's last menstrual period was 02/02/2013 (exact date).-Kristie Frye, no pattern noted -12 week mini Bernidoodle (in the car):moosePatient HistoryProblem List: has Varicose vein; Depression; Anxiety; Acne rosacea; GERD (gastroesophageal reflux disease); Essential hypertension; Seborrheic dermatitis; Vitamin D deficiency; Hyperlipidemia, unspecified hyperlipidemia type; Colon cancer screening- 6/21 2 adenoma polyps repeat 3 years dr marylin ; Morbid obesity with BMI of 40.0-44.9, adult (HC Code) (HC CODE); Psoriasis; Hyperthyroidism; Atrial fibrillation, unspecified type (HC Code)  (HC CODE); SOB (shortness of breath); Encounter for cardioversion procedure; and Sleep apnea on their problem list. Past Surgical History: has a past surgical history that includes Ablation saphenous vein w/ RFA; Urethral sling; and Laparoscopic colon resection.Past Medical History: has a past medical history of ADHD (attention deficit hyperactivity disorder), Arrhythmia, Dysthymia, GERD (gastroesophageal reflux disease), Heart palpitations, Hypertension, Osteoarthritis, Pneumonia, Reflux, Sleep apnea (10/15/2023), and Syncope.Family History: family history includes Breast cancer in her paternal aunt; Coronary Artery Disease in her mother; Diabetes in her brother, brother, father, and mother; Heart disease in her mother; Hypertension in her brother and brother; Obesity in her brother and brother; Pulmonary embolism in her mother; Sleep apnea in her brother and brother; Thrombophilia in her mother.Allergies:has no known allergies. Medications: Current Outpatient Medications:   apixaban , 5 mg, Oral, BID  buPROPion  XL, 150 mg, Oral, QAM  cholecalciferol  (vitamin D3), 1,000 Units, Oral, Daily  dexmethylphenidate , 20 mg, Oral, Daily  DULoxetine , 60 mg, Oral, Q24H famotidine , 40 mg, Oral, Nightly  fluocinolone  acetonide oil, Place into both ears daily as needed.  ketoconazole , Apply topically twice a week.  methIMAzole , 5 mg, Oral, Daily  metoprolol  succinate XL, 50 mg, Oral, Daily  multivitamin with minerals, 1 tablet, Oral, Daily  omeprazole , 40 mg, Oral, Q24H  metoprolol  tartrate, 1-2 tabs every 12 hours as needed for breakthru heart rate over 100 (Patient not taking: Reported on 01/04/2024) Social History: reports that she has never smoked. She has never used smokeless tobacco. She reports that she does not currently use alcohol. She reports that she does not use drugs. Menstrual/Sexual History: widow, wife died 2022IPV screening done yes  Patient feels safe at home OB History Gravida Para Term Preterm AB Living 2 2 2  0 0 2 SAB IAB Ectopic Molar Multiple Live Births 0 0 0  0 2  # Outcome Date GA Lbr Len/2nd Weight Sex Type Anes PTL Lv 2 Term 2006    F Vag-Spont   LIV 1 Term 2004    M Vag-Spont   LIV ROS: negative Objective: BP 130/80  - Ht 5' 8 (1.727 m)  - Wt 122.5 kg  - LMP 02/02/2013 (Exact Date)  - BMI 41.05 kg/m?  Physical Exam Neuro - Alert and oriented x3Gen - appears well, no distressPsych - patient answers all questions appropriately with normal affectNeck - normal appearanceResp - no increased work of breathingAbd - soft, non tender, non distended. No masses palpatedMusculoskeletal - grossly normalSkin - no visible lesionsBREAST AND PELVIC EXAM:Breasts - symmetrical, normal appearing. No masses, lymphadenopathy, or skin changes noted External female genitalia:  without lesions or erythema. Urethral meatus: normal in appearance. Vagina: No abnormal vaginal discharge, lesions  Cervix: without visible lesions, no CMTUterus:normal size, shape and consistencyAdnexa: no palpable masses. Non tenderRectal: no masses, occult blood negative	Health Maintenance:   Colonoscopy: 2021 Tubular adenoma F/u 3 yrs Dexa Bone Density:  Mammogram:  ordered-[place]isonBreast US :NAPAP:  completedAssessment / Plan:  Kristie Frye is a 63 y.o. G23P2002 female with Kristie painTV scan Pap guidelines reviewedPatient exam or treatment required medical chaperone.The sensitive parts of the examination were performed with chaperone present:  Kristie Frye, LPNKim C. Genette, MD

## 2024-02-05 NOTE — Progress Notes [1]
 HPI:   Kristie Frye is a 63 y.o. female who presents to this practice with RLQ painUltrasound reveals TV US  DONE FOR RLQ PAIN ANTEVERTED UTERUSENDOMETRIUM= 5.8 MMBILATERAL OV NOT SEENBILATERAL ADNEXA WNLNO FF IN CDS Patient's last menstrual period was 02/02/2013 (exact date). Problem List: has Varicose vein; Depression; Anxiety; Acne rosacea; GERD (gastroesophageal reflux disease); Essential hypertension; Seborrheic dermatitis; Vitamin D deficiency; Hyperlipidemia, unspecified hyperlipidemia type; Colon cancer screening- 6/21 2 adenoma polyps repeat 3 years dr marylin ; Morbid obesity with BMI of 40.0-44.9, adult (HC Code) (HC CODE); Psoriasis; Hyperthyroidism; Atrial fibrillation, unspecified type (HC Code)  (HC CODE); SOB (shortness of breath); Encounter for cardioversion procedure; Sleep apnea; and Graves' disease on their problem list. Past Surgical History: has a past surgical history that includes Ablation saphenous vein w/ RFA; Urethral sling; and Laparoscopic colon resection.Past Medical History: has a past medical history of ADHD (attention deficit hyperactivity disorder), Arrhythmia, Dysthymia, GERD (gastroesophageal reflux disease), Heart palpitations, Hypertension, Osteoarthritis, Pneumonia, Reflux, Sleep apnea (10/15/2023), and Syncope.Family History: family history includes Breast cancer in her paternal aunt; Coronary Artery Disease in her mother; Diabetes in her brother, brother, father, and mother; Heart disease in her mother; Hypertension in her brother and brother; Obesity in her brother and brother; Pulmonary embolism in her mother; Sleep apnea in her brother and brother; Thrombophilia in her mother.Allergies:has no known allergies. Medications: Current Outpatient Medications:   apixaban , 5 mg, Oral, BID  cholecalciferol  (vitamin D3), 1,000 Units, Oral, Daily  DULoxetine , 60 mg, Oral, Q24H  famotidine , 40 mg, Oral, Nightly  fluocinolone  acetonide oil, Place into both ears daily as needed.  ketoconazole , Apply topically twice a week.  lidocaine , 1 patch, Transdermal, Q24H  methIMAzole , 5 mg, Oral, Daily  metoprolol  succinate XL, 50 mg, Oral, Daily  multivitamin with minerals, 1 tablet, Oral, Daily  omeprazole , 40 mg, Oral, Q24H Social History: reports that she has never smoked. She has never used smokeless tobacco. She reports that she does not currently use alcohol. She reports that she does not use drugs. Menstrual/Sexual History: widow OB History Gravida Para Term Preterm AB Living 2 2 2  0 0 2 SAB IAB Ectopic Molar Multiple Live Births 0 0 0  0 2  # Outcome Date GA Lbr Len/2nd Weight Sex Type Anes PTL Lv 2 Term 2006    F Vag-Spont   LIV 1 Term 2004    M Vag-Spont   LIV Review of Systems All other systems reviewed and are negative.  Objective:  BP 130/78  - Ht 5' 8 (1.727 m)  - Wt 120.2 kg  - LMP 02/02/2013 (Exact Date)  - BMI 40.29 kg/m?  (265 lbs.)Physical ExamConstitutional:     Appearance: Normal appearance. HENT:    Head: Normocephalic and atraumatic. Cardiovascular:    Rate and Rhythm: Normal rate. Neurological:    General: No focal deficit present.    Mental Status: She is alert and oriented to person, place, and time. Vitals and nursing note reviewed.   Assessment / Plan:  RLQ pain-no gyn etiology noted -very tolerable, reassured

## 2024-02-29 ENCOUNTER — Encounter: Admit: 2024-02-29 | Payer: PRIVATE HEALTH INSURANCE | Attending: "Endocrinology | Primary: Internal Medicine

## 2024-03-25 ENCOUNTER — Encounter: Admit: 2024-03-25 | Payer: PRIVATE HEALTH INSURANCE | Primary: Internal Medicine

## 2024-03-26 ENCOUNTER — Encounter: Admit: 2024-03-26 | Payer: PRIVATE HEALTH INSURANCE | Primary: Internal Medicine

## 2024-04-02 ENCOUNTER — Encounter: Admit: 2024-04-02 | Payer: PRIVATE HEALTH INSURANCE | Attending: Internal Medicine | Primary: Internal Medicine

## 2024-04-04 ENCOUNTER — Ambulatory Visit: Admit: 2024-04-04 | Payer: PRIVATE HEALTH INSURANCE | Attending: Cardiovascular Disease | Primary: Internal Medicine

## 2024-04-25 ENCOUNTER — Encounter: Admit: 2024-04-25 | Payer: PRIVATE HEALTH INSURANCE | Primary: Internal Medicine

## 2024-05-02 ENCOUNTER — Encounter: Admit: 2024-05-02 | Payer: PRIVATE HEALTH INSURANCE | Primary: Internal Medicine

## 2024-05-05 ENCOUNTER — Encounter: Admit: 2024-05-05 | Payer: PRIVATE HEALTH INSURANCE | Primary: Internal Medicine

## 2024-05-15 ENCOUNTER — Encounter: Admit: 2024-05-15 | Payer: PRIVATE HEALTH INSURANCE | Primary: Internal Medicine
